# Patient Record
Sex: Male | Born: 1965 | Race: White | Hispanic: No | Marital: Married | Smoking: Former smoker
Health system: Western US, Academic
[De-identification: ages and names within clinical notes are randomized; demographics above are authoritative.]

## PROBLEM LIST (undated history)

## (undated) DIAGNOSIS — Z789 Other specified health status: Secondary | ICD-10-CM

## (undated) DIAGNOSIS — G43909 Migraine, unspecified, not intractable, without status migrainosus: Secondary | ICD-10-CM

## (undated) DIAGNOSIS — K219 Gastro-esophageal reflux disease without esophagitis: Secondary | ICD-10-CM

## (undated) DIAGNOSIS — F4310 Post-traumatic stress disorder, unspecified: Secondary | ICD-10-CM

## (undated) DIAGNOSIS — I471 Supraventricular tachycardia: Secondary | ICD-10-CM

## (undated) DIAGNOSIS — Z7289 Other problems related to lifestyle: Secondary | ICD-10-CM

## (undated) DIAGNOSIS — I10 Essential (primary) hypertension: Secondary | ICD-10-CM

## (undated) HISTORY — DX: Gastro-esophageal reflux disease without esophagitis: K21.9

## (undated) HISTORY — DX: Supraventricular tachycardia (CMS-HCC): I47.1

## (undated) HISTORY — PX: CARDIAC ELECTROPHYSIOLOGY STUDY AND ABLATION: SHX1294

## (undated) HISTORY — DX: Other problems related to lifestyle: Z72.89

## (undated) HISTORY — DX: Other specified health status: Z78.9

## (undated) HISTORY — DX: Essential (primary) hypertension: I10

## (undated) HISTORY — DX: Migraine, unspecified, not intractable, without status migrainosus: G43.909

## (undated) HISTORY — DX: Post-traumatic stress disorder, unspecified: F43.10

## (undated) MED ORDER — ZOLPIDEM TARTRATE 10 MG OR TABS
10.00 mg | ORAL_TABLET | Freq: Every evening | ORAL | Status: AC | PRN
Start: 2012-04-28 — End: ?

## (undated) MED ORDER — PRAVASTATIN SODIUM 40 MG OR TABS
40.00 mg | ORAL_TABLET | Freq: Every day | ORAL | 2 refills | Status: AC
Start: 2017-03-09 — End: ?

## (undated) MED ORDER — ZOLPIDEM TARTRATE 10 MG OR TABS
10.00 mg | ORAL_TABLET | Freq: Every evening | ORAL | Status: AC | PRN
Start: 2013-10-07 — End: ?

## (undated) MED ORDER — LISINOPRIL-HYDROCHLOROTHIAZIDE 10-12.5 MG OR TABS
1.00 | ORAL_TABLET | Freq: Every day | ORAL | 1 refills | Status: AC
Start: 2016-11-17 — End: ?

## (undated) MED ORDER — CLONAZEPAM 1 MG OR TABS
1.00 mg | ORAL_TABLET | Freq: Three times a day (TID) | ORAL | 0 refills | Status: AC
Start: 2019-04-08 — End: ?

## (undated) MED ORDER — FLUTICASONE PROPIONATE 50 MCG/ACT NA SUSP
2.00 | Freq: Every day | NASAL | 11 refills | Status: AC
Start: 2016-11-10 — End: ?

## (undated) MED ORDER — LIDOCAINE HCL 1 % IJ SOLN
0.10 mL | INTRAMUSCULAR | Status: AC | PRN
Start: 2015-03-07 — End: ?

## (undated) MED ORDER — LORAZEPAM 1 MG OR TABS
1.0000 mg | ORAL_TABLET | Freq: Four times a day (QID) | ORAL | 0 refills | Status: AC | PRN
Start: 2019-12-08 — End: ?

## (undated) MED ORDER — ZOLPIDEM TARTRATE 10 MG OR TABS
10.00 mg | ORAL_TABLET | Freq: Every evening | ORAL | Status: AC | PRN
Start: 2014-10-25 — End: ?

## (undated) MED ORDER — SUMATRIPTAN SUCCINATE 100 MG OR TABS
100.00 mg | ORAL_TABLET | Freq: Once | ORAL | Status: AC | PRN
Start: 2013-10-19 — End: ?

## (undated) MED ORDER — ALPRAZOLAM 2 MG OR TABS
2.00 mg | ORAL_TABLET | Freq: Every evening | ORAL | Status: AC | PRN
Start: 2016-08-11 — End: ?

## (undated) MED ORDER — LISINOPRIL-HYDROCHLOROTHIAZIDE 10-12.5 MG OR TABS
1.00 | ORAL_TABLET | Freq: Every day | ORAL | Status: AC
Start: 2014-10-25 — End: ?

## (undated) MED ORDER — LORAZEPAM 1 MG OR TABS
1.0000 mg | ORAL_TABLET | Freq: Four times a day (QID) | ORAL | 0 refills | Status: AC | PRN
Start: 2019-12-07 — End: ?

## (undated) MED ORDER — ZOLPIDEM TARTRATE 10 MG OR TABS
10.00 mg | ORAL_TABLET | Freq: Every evening | ORAL | Status: AC | PRN
Start: 2012-04-27 — End: ?

## (undated) MED ORDER — ALPRAZOLAM 2 MG OR TABS
2.00 mg | ORAL_TABLET | Freq: Every evening | ORAL | Status: AC | PRN
Start: 2015-01-30 — End: ?

## (undated) MED ORDER — LACTATED RINGERS IV SOLN
INTRAVENOUS | Status: AC
Start: 2015-03-07 — End: ?

## (undated) MED ORDER — ZOLPIDEM TARTRATE 10 MG OR TABS
10.00 mg | ORAL_TABLET | Freq: Every evening | ORAL | Status: AC | PRN
Start: 2014-07-12 — End: ?

## (undated) MED ORDER — BECLOMETHASONE DIPROPIONATE 80 MCG/ACT IN AERS
1.00 | INHALATION_SPRAY | Freq: Two times a day (BID) | RESPIRATORY_TRACT | 2 refills | Status: AC
Start: 2017-08-25 — End: ?

## (undated) MED ORDER — LISINOPRIL-HYDROCHLOROTHIAZIDE 10-12.5 MG OR TABS
1.00 | ORAL_TABLET | Freq: Every day | ORAL | 0 refills | Status: AC
Start: 2016-12-09 — End: ?

---

## 2005-01-31 ENCOUNTER — Other Ambulatory Visit (INDEPENDENT_AMBULATORY_CARE_PROVIDER_SITE_OTHER): Payer: Self-pay | Admitting: Occupational Health

## 2009-12-01 DIAGNOSIS — I471 Supraventricular tachycardia: Secondary | ICD-10-CM

## 2009-12-01 HISTORY — DX: Supraventricular tachycardia (CMS-HCC): I47.1

## 2009-12-01 HISTORY — PX: GALLBLADDER SURGERY: SHX652

## 2010-05-27 ENCOUNTER — Ambulatory Visit (INDEPENDENT_AMBULATORY_CARE_PROVIDER_SITE_OTHER): Admitting: Family Medicine

## 2010-06-10 ENCOUNTER — Encounter (INDEPENDENT_AMBULATORY_CARE_PROVIDER_SITE_OTHER): Payer: Self-pay | Admitting: Family Medicine

## 2010-06-10 ENCOUNTER — Ambulatory Visit (INDEPENDENT_AMBULATORY_CARE_PROVIDER_SITE_OTHER): Admitting: Family Medicine

## 2010-06-10 MED ORDER — SUMATRIPTAN SUCCINATE 100 MG OR TABS
100.00 mg | ORAL_TABLET | Freq: Once | ORAL | Status: DC | PRN
Start: ? — End: 2011-01-28

## 2010-06-10 MED ORDER — OMEPRAZOLE 20 MG OR CPDR
20.00 mg | DELAYED_RELEASE_CAPSULE | Freq: Every day | ORAL | Status: DC
Start: ? — End: 2015-03-07

## 2010-06-10 MED ORDER — XANAX XR PO
4.00 mg | ORAL | Status: DC
Start: ? — End: 2011-11-17

## 2010-06-10 NOTE — Progress Notes (Signed)
 SUBJECTIVE:  Brett Palmer is a 44 year old male who presents to clinic for:   Chief Complaint   Patient presents with   . Establish Care     - Has 6 year h/o biliary colic pain found to have gallstones on bedside US by friend who is tech.  Abdominal pain is RUQ, sharp, non radiating, intensity 10/10 at it's worst, non n/v/d, brought on by fatty meals, spontaneously resolves after fating for a while.  Last episode was 2-3 weeks ago.  Was seen in [place] for this issue but told could not be scheduled for surgery as it was not a acute issue.    - Has been gaining weight over the past few yrs. Has tried dieting without successful weight loss, and does not typically exercise.  Denies any FHx of HTN, HL or DM.    Review of Systems -   Constitutional: negative.  Eyes: negative.  Ears, Nose, Mouth, Throat: negative.  CV: negative.  Resp: negative.  GI: per hPI, rest neg.  GU: negative.  Musculoskeletal: negative.  Integumentary: negative.  Neuro: negative.  Psych: negative.  Endo: negative.  Heme/Lymphatic: negative.  Allergy/Immun: negative.    History:   There are no active problems to display for this patient.    Current outpatient prescriptions ordered prior to encounter   Medication Sig Dispense Refill   . omeprazole (PRILOSEC) 20 MG capsule Take 20 mg by mouth daily.       . sumatriptan (IMITREX) 100 MG tablet Take 100 mg by mouth once as needed. May repeat in 2 hours in needed           Allergies   Allergen Reactions   . Compazine Anxiety   . Latex Rash       Family History   Problem Relation Age of Onset   . Cancer Father      melanoma, died at age 8       History   Social History   . Marital Status: Married     Spouse Name: [name]     Number of Children: 2   . Years of Education: N/A   Occupational History   . 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 Smoking status: Former Smoker -- 0.5 packs/day for 10 years     Types: Cigarettes     Quit date: 01/11/1990   . Smokeless tobacco: Not on  file   . Alcohol Use: 3.5 - 7.0 oz/week     7-14 drink(s) per week      wine   . Drug Use: No   . Sexually Active: Yes -- Male partner(s)   Other Topics Concern   . Not on file   Social History Narrative   . No narrative on file       OBJECTIVE:    BP 147/80  Pulse 82  Temp(Src) 98 F (36.7 C) (Oral)  Wt 229 lb (103.874 kg)    There is no height on file to calculate BMI.    General Appearance: healthy, alert, no distress, pleasant affect, cooperative, skin warm, dry, and pink.  Mouth: mmm.  Neck:  Neck supple. No adenopathy, thyroid symmetric, normal size.  Heart:  normal rate and regular rhythm, no murmurs, clicks, or gallops.  Lungs: clear to auscultation bilateral  Abdomen: BS normal.  Abdomen soft, non-tender, neg Murphy's sign.  No masses or organomegaly.      ASSESMENT AND PLAN:  [name] was seen today for establish care.  Diagnoses and associated orders for this visit:    Biliary colic  Currently asymptomatic with benign abdominal exam but per history abdominal pain seems c/w with biliary colic.  Will get [place] to eval for gallstones or biliary duct dilation.  Surgery consult depending on results.  For now will continue on dietary precautions to avoid new onset pain.  - MYCHART ACCESS CODE PROCEDURE  - US Abdomen  - Comprehensive Metabolic Panel Yellow serum separator tube    Obesity and Elevated BP  Discussed importance of diet, exercise and weight loss to decrease CV risk factors.  Check basic labs and referral placed for nutrition clinic.  - Lipid Panel Yellow serum separator tube; Future  - Comprehensive Metabolic Panel Yellow serum separator tube  - Glycosylated Hgb(A1C), Blood Lavender  - TSH, Blood Yellow serum separator tube  - Nutrition Clinic      Pt was seen by and discussed with attending Dr. Ranelle Oyster MD  [facility]  [id number]

## 2010-06-11 ENCOUNTER — Other Ambulatory Visit (INDEPENDENT_AMBULATORY_CARE_PROVIDER_SITE_OTHER): Payer: Self-pay | Admitting: Family Medicine

## 2010-06-11 DIAGNOSIS — E669 Obesity, unspecified: Secondary | ICD-10-CM

## 2010-06-15 NOTE — Progress Notes (Signed)
 Attending Note:    Subjective:  I reviewed the history.  Patient interviewed and examined.  History of present illness (HPI):  Establish Care     Review of Systems (ROS): As per  the resident's note.  Past Medical, Family, Social History:  As per  the resident's  note.    Objective:   I have examined the patient and I concur with the resident's exam.    Assessment and plan reviewed with the resident physician.  I agree with the resident's plan as documented.    See the resident's note for further details.

## 2010-06-25 LAB — LIPID(CHOL FRACT) PANEL, BLOOD
Cholesterol: 264 mg/dL — ABNORMAL HIGH (ref <200)
HDL-Cholesterol: 78 mg/dL
LDL-Chol (Calc): 169 mg/dL — ABNORMAL HIGH (ref <160)
Triglycerides: 84 mg/dL (ref 10–170)

## 2010-06-25 LAB — COMPREHENSIVE METABOLIC PANEL, BLOOD
ALT (SGPT): 206 U/L — ABNORMAL HIGH (ref 0–41)
AST (SGOT): 134 U/L — ABNORMAL HIGH (ref 0–40)
Albumin: 4.7 g/dL (ref 3.5–5.2)
Alkaline Phos: 71 U/L (ref 40–129)
BUN: 15 mg/dL (ref 6–20)
Bicarbonate: 28 mmol/L (ref 22–29)
Bilirubin, Tot: 0.4 mg/dL (ref <1.2)
Calcium: 9.8 mg/dL (ref 8.6–10.5)
Chloride: 101 mmol/L (ref 98–107)
Creatinine: 0.89 mg/dL (ref 0.67–1.17)
Glucose: 101 mg/dL (ref 70–115)
Potassium: 4.3 mmol/L (ref 3.5–5.1)
Sodium: 138 mmol/L (ref 136–145)
Total Protein: 7.3 g/dL (ref 6.0–8.0)

## 2010-06-25 LAB — TSH, BLOOD: TSH: 3.6 u[IU]/mL (ref 0.27–4.20)

## 2010-06-25 LAB — GLYCOSYLATED HGB(A1C), BLOOD: Glyco Hgb (A1C): 5.6 % (ref 4.8–5.9)

## 2010-06-25 LAB — GFR: GFR: >60 mL/min

## 2010-07-02 ENCOUNTER — Telehealth (INDEPENDENT_AMBULATORY_CARE_PROVIDER_SITE_OTHER): Payer: Self-pay | Admitting: Family Medicine

## 2010-07-02 NOTE — Telephone Encounter (Signed)
 Called patient and left message indicating need to f/u in clinic to discuss abnormal labs and abdominal US.

## 2010-07-09 NOTE — Telephone Encounter (Signed)
 Called and spoke with pt. Scheduled follow up appt 07-16-10 with Dr Alphonse Guild

## 2010-07-16 ENCOUNTER — Ambulatory Visit (INDEPENDENT_AMBULATORY_CARE_PROVIDER_SITE_OTHER): Admitting: Family Medicine

## 2010-07-16 MED ORDER — ZOLPIDEM TARTRATE 10 MG OR TABS
5.0000 mg | ORAL_TABLET | Freq: Every evening | ORAL | Status: DC | PRN
Start: 2010-07-16 — End: 2010-09-30

## 2010-07-16 NOTE — Patient Instructions (Signed)
 Health Maintenance: Controlling Cholesterol    What is cholesterol?   Cholesterol is a type of fat. It has both good and bad effects on the body. Your body uses cholesterol to make hormones and to build and maintain nerve cells. However, when your body has too much cholesterol, deposits of fat called plaque form inside blood vessel walls. The blood vessel walls thicken and the vessels become narrower (a condition called atherosclerosis). This change in the blood vessels reduces blood flow through the blood vessels, possibly leading to heart attacks or strokes.     Most of the cholesterol in your blood is made by your liver from the fats, carbohydrates, and proteins you eat. You also get cholesterol by eating animal products such as meat, eggs, and dairy products.     How is cholesterol measured?   When you get your cholesterol checked, your health care provider will give you a number for your total cholesterol level. You can use the chart below to see if your total cholesterol is high.       ----------------------------------------    less than 200        good     200 to 239           borderline     high 240 or above    high     ----------------------------------------           When your cholesterol is measured and found to be high, your health care provider may also check the amount of LDL (low-density lipoproteins) and HDL (high-density lipoproteins) in your blood. LDL and HDL carry cholesterol through your blood. LDLs carry a lot of cholesterol, leave behind fatty deposits on your artery walls, and contribute to heart disease. HDLs do the opposite. They clean the artery walls and remove extra cholesterol from the body, thus lowering the risk of heart disease. LDL is called "bad" cholesterol. (You can think of "L" for "lousy" cholesterol.) HDL is called "good" cholesterol (think of "H" for "healthy" cholesterol). It is good to have low levels of LDL and high levels of HDL.     The recommended levels of LDL are  shown in the following chart:      ---------------------------------------------------------------    Less than 160   For most people     Less than 130   If you have an increased risk of heart                  disease     Less than 100   If you have heart disease, diabetes,                  peripheral artery disease, abdominal aortic                  aneurysm, or symptomatic carotid artery disease     ---------------------------------------------------------------             For HDL, a level below 40 mg/dL is too low. The recommended HDL level is 45 mg/dL or higher.     How can I control my cholesterol level?   Cholesterol levels can usually be controlled by eating right, exercising, and not smoking.     Follow these diet guidelines to help control your cholesterol:       Reduce the amount of cholesterol in your diet. The American Heart Association recommends eating less than 300 milligrams (mg) of cholesterol a day if you do not have heart disease.  Eat less than 200 mg if you have heart disease.   Eat less fat. Fats should contribute no more than 30% of your daily calories. No more than 10% of the fat you eat should be saturated fat. If you have heart disease, no more than 7% of the fat should be saturated. Some kinds of fats are better than others. Polyunsaturated and monounsaturated fats are better than saturated fats. Monounsaturated fats are found in olive oil, canola oil, and avocados. Polyunsaturated fats are found in fish and some vegetable oils. Saturated fat raises your blood cholesterol because it makes it hard for the body to clear the cholesterol away. Saturated fat is found in different amounts in almost all foods. Butter, some oils, meat, and poultry fat contain a lot of saturated fat.   Adjust the amount of calories you eat and exercise regularly to maintain a lean body weight.     To control the amount of fat and cholesterol you eat:       Check food labels for fat and cholesterol content.   Limit  the amount of butter and margarine you eat.   Use sunflower, safflower, soybean, canola, corn, or olive oil rather than tropical oils such as palm or coconut.   Use salad dressings and margarine made with polyunsaturated and monounsaturated fats.   Use egg whites or egg substitutes rather than whole eggs.   Replace whole-milk dairy products with nonfat or low-fat milk, cheese, spreads, and yogurt.   Eat skinless chicken, [place], fish, and meatless entrees more often than red meat.   Choose lean cuts of meat and trim off all visible fat. Keep portion sizes moderate.   Avoid fatty desserts such as ice cream, cream-filled cakes, and cheesecakes. Choose fresh fruits, nonfat frozen yogurt, Popsicles, etc.   Reduce the amount of fried foods, vending machine food, and fast food you eat.   Limit the amount of nuts you eat, especially nuts high in saturated fat. Examples of nuts that are especially high in saturated fat are cashews, pistachios, and [place] and macadamia nuts.   Eat fruits and vegetables (especially fresh fruits and leafy vegetables), beans, and whole grains daily. The fiber in these foods helps lower cholesterol.   Look for low-fat or nonfat varieties of the foods you like to eat, or look for substitutes.     Exercise goes hand-in-hand with a healthy diet for controlling cholesterol. Exercise helps because it:       Keeps your weight down.   Decreases your total cholesterol level.   Decreases your LDL (bad cholesterol).   Increases your HDL (good cholesterol).     A good exercise program includes aerobic exercise. Aerobic exercise is any activity that keeps your heart rate up (such as swimming, jogging, walking, and bicycling). You should get 20 to 30 minutes of aerobic exercise at least every other day.     If you haven't been exercising, ask your health care provider for an exercise prescription and start your new exercise program slowly.     Do not smoke. Smoking increases your risk of heart disease  because it lowers HDL levels.     High cholesterol may run in families. Know your family history and discuss it with your health care provider.     In summary, to control your cholesterol level:       Eat healthy.   Get regular exercise.   Don't smoke.   Check your cholesterol yearly.         --------------------------------------------------------------------------------  Published by [facility].    This content is reviewed periodically and is subject to change as new health information becomes available. The information is intended to inform and educate and is not a replacement for medical evaluation, advice, diagnosis or treatment by a healthcare professional.       Developed by [employer].   Copyright  2004 [facility] [employer]. All rights reserved.   Copyright  Clinical Reference Systems 2004  Adult [facility] Advisor - Copyright  [employer]. All rights reserved.   www.mdconsult.com

## 2010-07-16 NOTE — Progress Notes (Signed)
 SUBJECTIVE:  Brett Palmer is a 44 year old male who presents to clinic for:   Chief Complaint   Patient presents with   . Recheck  Has intermittent almost daily RUQ pain, after meals, moderate intensity, non radiating, lasting about 6 hrs.  Has made some dietary changes that seem to have helped a little but still having significant symptoms.  Sometimes has nausea but no vomiting.   Recent labs showed:  Component Latest Ref Rng 06/25/2010 06/25/2010 06/25/2010 06/25/2010 06/25/2010      9:00 AM  9:00 AM  9:00 AM  9:00 AM  9:00 AM   Glucose 70 - 115 mg/dL 175       Bun 6 - 20 mg/dL 15       Creatinine 1.02 - 1.17 mg/dL 5.85       Sodium 277 - 145 mmol/L 138       Potassium 3.5 - 5.1 mmol/L 4.3       Chloride 98 - 107 mmol/L 101       Bicarbonate 22 - 29 mmol/L 28       Calcium 8.6 - 10.5 mg/dL 9.8       Total Protein 6.0 - 8.0 g/dL 7.3       ALBUMIN 3.5 - 5.2 g/dL 4.7       Bilirubin, Tot Low: <1.2 mg/dL 0.4       AST (SGOT) 0 - 40 U/L 134 (H)       ALT (SGPT) 0 - 41 U/L 206 (H)       Alkaline Phos 40 - 129 U/L 71       Cholesterol Low: <200 mg/dL    824 (H)    HDL-Cholesterol mg/dL    78    LDL-Chol (Calc) Low: <160 mg/dL    235 (H)    Triglycerides 10 - 170 mg/dL    84    Glyco Hgb (T6R) 4.8 - 5.9 %  5.6      TSH 0.27 - 4.20 uIU/mL   3.60     GFR mL/min     >60        . Ultrasound  Report shows fatty liver and cholelithiasis.     Review of Systems -   Constitutional: weight gain.  CV: negative.  Resp: negative.  GI: abdominal pain, excessive gas or bloating, negative for: vomiting, early satiety, melena.  GU: negative.  Endo: negative.    History:   There are no active problems to display for this patient.    Current outpatient prescriptions ordered prior to encounter   Medication Sig Dispense Refill   . ALPRAZolam (XANAX XR PO) 4 mg.       . omeprazole (PRILOSEC) 20 MG capsule Take 20 mg by mouth daily.       . sumatriptan (IMITREX) 100 MG tablet Take 100 mg by mouth once as needed. May repeat in 2 hours in needed            Allergies   Allergen Reactions   . Compazine Anxiety   . Latex Rash       Family History   Problem Relation Age of Onset   . Cancer Father      melanoma, died at age 54       History   Social History   . Marital Status: Married     Spouse Name: [name]     Number of Children: 2   . Years of Education: N/A   Occupational History   .  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 Smoking status: Former Smoker -- 0.5 packs/day for 10 years     Types: Cigarettes     Quit date: 01/11/1990   . Smokeless tobacco: Not on file   . Alcohol Use: 3.5 - 7.0 oz/week     7-14 drink(s) per week      wine   . Drug Use: No   . Sexually Active: Yes -- Male partner(s)   Other Topics Concern   . Not on file   Social History Narrative   . No narrative on file         OBJECTIVE:    BP 149/91  Pulse 117  Temp(Src) 97.7 F (36.5 C) (Oral)  Wt 102.513 kg (226 lb)    There is no height on file to calculate BMI.    General Appearance: healthy, alert, no distress, pleasant affect, cooperative, skin warm, dry, and pink.  Mouth: OP clear and mmm.  Heart:  normal rate and regular rhythm, no murmurs, clicks, or gallops.  Lungs: clear to auscultation b/l  Abdomen: Abdomen soft, mildly tender in RUQ with questionable Murphy sign, no rebound or guarding. No masses or organomegaly. Bowel sounds normal.       ASSESMENT AND PLAN:  Shaman was seen today for recheck and ultrasound.  Diagnoses and associated orders for this visit:    Cholelithiasis without obstruction  Has recurrent, almost daily RUQ pain related to meals with transaminitis and US showing cholelithiasis.  It's affecting his daily life and has made some lifestyle and dietary changes with minimal improvement, discussed them with patient and suggested multiple stricter dietary precautions including alcohol abstinence.  Will f/u with surgery for evaluation and further recs.  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 Drinks occassionally but was drinking almost daily during the time labs were drawn, denies daily tylenol use and not on any other meds.  Likely due to fatty liver from cholesterol, weight, alcohol and diet regimen.  Patient will make significant lifestyle changes and if still having significant transaminitis despite changes in weight, cholesterol and diet, then will have to further w/u.  - Liver Panel, Blood Yellow serum separator tube; Future    Hyperlipidemia ldl goal < 160  Diet and lifestyle changes for now for weight loss and recheck in 3 months.  - Lipid Panel Yellow serum separator tube; Future    Elevated blood pressure reading without diagnosis of hypertension  Will recheck at next visit and if consistently elevated consider diagnosis of HTN and starting HCTZ.    Insomnia  Alternates day and night shifts at works, has tried multiple calming techniques and melatonin without help.  Trial ambien, start with 5 mg and increase to 10 mg prn.   - zolpidem (AMBIEN) 10 MG tablet; Take 0.5 tablets by mouth nightly as needed for Insomnia.        Pt was discussed with attending Dr. Ranelle Oyster MD  [facility]  PGY3

## 2010-07-22 ENCOUNTER — Telehealth (HOSPITAL_BASED_OUTPATIENT_CLINIC_OR_DEPARTMENT_OTHER): Payer: Self-pay | Admitting: Surgery

## 2010-07-22 NOTE — Telephone Encounter (Signed)
 Pt would like a call back regarding a sooner appt, would like appt sooner then 08/14/2010. States he was told by various sx physicians that his condition is urgent. Pls contact pt as soon as possible.

## 2010-07-23 NOTE — Telephone Encounter (Signed)
 SPOKE TO PATIENT, HIS NO SYMPTOMS AT THIS TIME, WILL WAIT FOR 08/14/10 APPT.

## 2010-08-14 ENCOUNTER — Encounter (INDEPENDENT_AMBULATORY_CARE_PROVIDER_SITE_OTHER): Payer: Self-pay | Admitting: Surgery

## 2010-08-14 ENCOUNTER — Ambulatory Visit (HOSPITAL_BASED_OUTPATIENT_CLINIC_OR_DEPARTMENT_OTHER): Admitting: Surgery

## 2010-08-14 VITALS — BP 131/84 | HR 95 | Temp 97.4°F | Resp 14 | Ht 72.0 in | Wt 221.1 lb

## 2010-08-14 LAB — DRAW TYPE AND SCREEN SPECIMEN

## 2010-08-14 NOTE — Patient Instructions (Signed)
Brett Palmer should be excused from work from August 26, 2010 until September 09, 2010 for medical reasons.  Work restrictions include not lifting anything over 10 pounds.  Please place patient on light duty until September 23, 2010.

## 2010-08-14 NOTE — Progress Notes (Signed)
HPI:  44 year old male referred from his primary care for symptomatic gallstones.  He presents today with a long history of post-prandial RUQ pain.  He reports a 10/10 sharp pain in his RUQ after eating greasy or fatty foods that lasts several hours.  The pain does not radiate to any other location.  He reports approximately 6 episodes per year.  He denies any vomiting but does state that he has occasional nausea.  He had a RUQ [place] in July which revealed a fatty thickened gallbladder with a large stone.  Labs at that time showed a transaminitis but no elevations in his bilirubins.  He does endorse a family history of gall bladder disease, stating that most of his family has had their gallbladders removed.  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    PMH:  Meniere's, PTSD    PSH:  None    FH:  As stated in HPI    SH:  Does drink 2 glasses of wine per night    Medications: Ambien and Xanax PRN    Allergies:  Compazine and latex    Physical Exam  Blood pressure 131/84, pulse 95, temperature 97.4 F (36.3 C), temperature source Oral, resp. rate 14, height 6' (1.829 m), weight 100.281 kg (221 lb 1.3 oz).  Gen:  NAD, fair haired and fair skinned gentelman  Abd: soft, NTND, no pain in RUQ, no organomegaly    Lab Results   Component Value Date    NA 138 06/25/2010    K 4.3 06/25/2010    CL 101 06/25/2010    BICARB 28 06/25/2010    BUN 15 06/25/2010    CREAT 0.89 06/25/2010    GLU 101 06/25/2010    [place] 9.8 06/25/2010       Lab Results   Component Value Date    AST 134 06/25/2010    ALT 206 06/25/2010    ALK 71 06/25/2010    TP 7.3 06/25/2010    ALB 4.7 06/25/2010    TBILI 0.4 06/25/2010       [place]:  1. Mild enlarged and fatty liver.    2. Cholelithiasis without evidence of acute cholecystitis.    A/P:  44 year old male with long history of post-prandial non-radiating RUQ and gallstones on [place], likely symptomatic cholelithiasis.  1.  Discussed risks and benefits of laparoscopic cholecystectomy.  Pt agrees to have  procedure.  2.  OR scheduled for 9/24.  3.  CBC, Chem 7, LFTs, type and screen, EKG ordered pre-op  4.  Pt scheduled for pre-op clinic  5.  Work excuse note written and given to pt

## 2010-08-14 NOTE — Progress Notes (Signed)
31M with gallstones and biliary colic - description of pain very classic - 10/10, lasts hours, onset with fatty food, gets 1x attack / month. RUQ pain only, no radiation.  Tale Prilosec for GERD, doesn't help this pain.     Strong family history of gallbladder issues, all imediate family had gallbladders out.    No prior ab surgery. PMH Menieres, PTSD, GERD.    On exam. No sig RUQ findings,     Explained lap cholecystectomy, he agrees, booked for 2 wks.

## 2010-08-20 ENCOUNTER — Ambulatory Visit (HOSPITAL_BASED_OUTPATIENT_CLINIC_OR_DEPARTMENT_OTHER): Admitting: Family

## 2010-08-20 VITALS — BP 124/79 | HR 94 | Temp 97.6°F | Resp 18 | Ht 72.0 in | Wt 217.8 lb

## 2010-08-20 LAB — CBC WITH DIFF, BLOOD
Abs Eosinophils: 0.2 10*3/uL (ref 0.0–0.5)
Abs Lymphs: 2.5 10*3/uL (ref 0.8–3.1)
Abs Monos: 0.4 10*3/uL (ref 0.2–0.8)
Absolute Neutrophil Count: 2.7 10*3/uL (ref 1.6–7.0)
Eosinophils: 3 % (ref 1–7)
Hct: 46.3 % (ref 40.0–50.0)
Hgb: 16 g/dL (ref 13.7–17.5)
Lymphocytes: 43 % (ref 19–53)
MCH: 30.2 pg (ref 26.0–32.0)
MCHC: 34.6 % (ref 32.0–36.0)
MCV: 87.4 um3 (ref 79.0–95.0)
MPV: 9.7 fL (ref 9.4–12.4)
Monocytes: 7 % (ref 5–12)
Plt Count: 170 10*3/uL (ref 140–370)
RBC: 5.3 10*6/uL (ref 4.60–6.10)
RDW: 12.8 % (ref 12.0–14.0)
Segs: 47 % (ref 34–71)
WBC: 5.8 10*3/uL (ref 4.0–10.0)

## 2010-08-20 LAB — TYPE & SCREEN
ABO/RH: O NEG
Antibody Screen: NEGATIVE

## 2010-08-20 LAB — LIVER PANEL, BLOOD
ALT (SGPT): 153 U/L — ABNORMAL HIGH (ref 0–41)
AST (SGOT): 87 U/L — ABNORMAL HIGH (ref 0–40)
Albumin: 5 g/dL (ref 3.5–5.2)
Alkaline Phos: 72 U/L (ref 40–129)
Bilirubin, Dir: 0.2 mg/dL — ABNORMAL HIGH (ref <0.2)
Bilirubin, Tot: 0.5 mg/dL (ref <1.2)
Total Protein: 7.7 g/dL (ref 6.0–8.0)

## 2010-08-20 LAB — GFR: GFR: >60 mL/min

## 2010-08-20 LAB — BASIC METABOLIC PANEL, BLOOD
BUN: 11 mg/dL (ref 6–20)
Bicarbonate: 29 mmol/L (ref 22–29)
Calcium: 9.8 mg/dL (ref 8.6–10.5)
Chloride: 98 mmol/L (ref 98–107)
Creatinine: 0.87 mg/dL (ref 0.67–1.17)
Glucose: 92 mg/dL (ref 70–115)
Potassium: 4.3 mmol/L (ref 3.5–5.1)
Sodium: 137 mmol/L (ref 136–145)

## 2010-08-20 MED ORDER — ALPRAZOLAM 2 MG OR TABS
2.00 mg | ORAL_TABLET | Freq: Every day | ORAL | Status: DC
Start: ? — End: 2013-02-22

## 2010-08-20 NOTE — Progress Notes (Signed)
 This visit was done for pre-operative care and a history and physical, if indicated, was entered in anticipation of an upcoming procedure.

## 2010-08-20 NOTE — Patient Instructions (Addendum)
Nothing to eat or drink after midnight on day of surgery.  Take prilosec with sip of water on the morning of surgery.  Stop herbal medications and supplements one week prior to surgery.  Stop aspirin and all antiinflammatories (such as advil, nuprin, motrin, aleve) one week prior to surgery.     Pt need to go to lab after preop visit 08/20/10

## 2010-08-20 NOTE — H&P (Signed)
Preoperative History and Physical    Primary Care Physician [name], MD    Attending Physician:  Dwana Curd M.D.    Chief complaint:  Biliary colic    Pain Score: zero  Location:   Quality:   Patient denies physical or sexual abuse    History of Present  Illness:  This is a 44 year old male who presents to the [facility] for preoperative history and physical.  Patient is scheduled for laparoscopic cholecystectomy without grams on 08-26-10 with Dr. Dwana Curd .    Patient Active Problem List   Diagnoses Date Noted   . Biliary colic [574.20K] 08/14/2010         Past Medical History   Diagnosis Date   . Gastroesophageal reflux disease    . Migraine headache      part of gulf war syndrome   . PTSD (post-traumatic stress disorder) 1995-present     treated by Dr. Ahmed Prima at [facility]          Past Surgical History   Procedure Date   . Wisdom teeth extraction      ( no family problems with anesthesia)    Current outpatient prescriptions   Medication Sig Dispense Refill   . alprazolam (XANAX) 2 MG tablet Take 2 mg by mouth daily. 2 tablet once a day       . zolpidem (AMBIEN) 10 MG tablet Take 0.5 tablets by mouth nightly as needed for Insomnia.  30 tablet  0   . omeprazole (PRILOSEC) 20 MG capsule Take 20 mg by mouth daily.       . sumatriptan (IMITREX) 100 MG tablet Take 100 mg by mouth once as needed. May repeat in 2 hours in needed             Allergies   Allergen Reactions   . Compazine Anxiety   . Latex Rash         FAMILY HISTORY:  Diabetes: none  TB: none  Cancer: father melanoma  HTN: none  Cardiac: none  Mental health: none    History   Social History   . Marital Status: Married     Spouse Name: [name]     Number of Children: 2   . Years of Education: N/A   Occupational History   . 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 Smoking status: Former Smoker -- 0.5 packs/day for 10 years     Types: Cigarettes     Quit date: 01/11/1990   . Smokeless tobacco: Not on file   . Alcohol Use: 3.5 - 7.0  oz/week     7-14 drink(s) per week      wine   . Drug Use: No   . Sexually Active: Yes -- Male partner(s)       Review of Systems:  GENERAL: No recent cold or flu, no fever, no wt. loss  EYES: no  glasses, no acuity change.  ENT: left ear 90% hearing loss, no aide nasal drainage, sore throat.  CV:  Denies chest pain, palpitations, PND, orthopnea.  Able to walk 6 miles, able to climb 6 ftls.  RESP: denies SOB, cough, wheezing, sputum, calf pain.   Denies  sleep apnea.  - STOP  GI: + intermittent abd. Pain, denies nausea, vomiting, diarrhea,  + constipation,  No melena, no  hematochezia,  + GERD/heartburn.  GU: denies dysuria, urgency, hematuria,  frequency, nocturia.  MUSCULOSKELETAL:  Denies  joint pain, back pain, neck pain.  SKIN:  Denies lesions, rashes, masses.  NEURO: denies  syncope, dizziness; +  Headaches.  No seizures, weakness, numbness.  PSYCH: denies  depression, anxiety, suicidal. + PTSD  ENDOCRINE: denies  polyuria, polydipsia, thyroid problems.  HEME/LYMPH:  Denies  bruising, bleeding, anemia.   MRSA-VRE:  Denies  HEPATITIS:  Denies  CANCER:  denies    Physical Exam:   Vitals: BP 124/79  Pulse 94  Temp(Src) 97.6 F (36.4 C) (Oral)  Resp 18  Ht 6' (1.829 m)  Wt 98.8 kg (217 lb 13 oz)  BMI 29.54 kg/m2  SpO2 98%    Physical Exam  General: WN WD male NAD, well groomed,  cooperative,   A+O x 3,  speech clear.  HEENT:  NCAT,  EOMI, PERRL, sclera clear,  TM intact, throat clear,  MP 1.  Dentition intact.  prognath +.  HMD3. Full ROM  NECK/THYROID:  NTTP,  supple & FROM,  no masses, no thyromegaly.  No lymphadenopathy .  CV: S1, S2 with no murmurs, rubs, or gallops; no JVD.  Pulmonary: CTAB, no WRR, normal A/P diameter.  Abdomen: NTND, NABS, no HSM, no hernias.  Musculoskeletal: NTTP, FROM x 4 extremities, strength 5/5 x 4 extremities.  Back:  NTTP along spinal column, no CVAT, ROM: full.  Neuro:  CN 2-12 grossly intact.  O x3.  Voice clear.  Strength equilateral.   Skin: normal color, turgor; no  rashes, lesions, or open sores.  Extremities: warm, dry without cyanosis, clubbing, or edema, peripheral pulses palpable  .       Labs: (new labs ordered by surgeon and pending)  Lab Results   Component Value Date    NA 138 06/25/2010    K 4.3 06/25/2010    CL 101 06/25/2010    BICARB 28 06/25/2010    BUN 15 06/25/2010    CREAT 0.89 06/25/2010    GLU 101 06/25/2010    [place] 9.8 06/25/2010    AST 134 06/25/2010    ALT 206 06/25/2010    ALK 71 06/25/2010    TBILI 0.4 06/25/2010    ALB 4.7 06/25/2010    TP 7.3 06/25/2010       EKG: 08-20-10 NSR, possible left atrial enlargement, T wave abnormality     Imaging: 06-25-10 US IMPRESSION:  1. Mild enlarged and fatty liver.  2. Cholelithiasis without evidence of acute cholecystitis.      Assessment/Plan  A.   44 year old male  with a history of abdomenal pain and gallstones.    P.  Scheduled for laparoscopic cholecystectomy without grams on 08-26-10 with Dr. Dwana Curd.    Instructions  1. Do not drink or eat anything after midnight, unless otherwise specified.   2. Take your medications as directed by anesthesia.   3. Do not take herbal supplements for 7 days prior to surgery.   4. Stop ASA, NSAID's 7-10 days prior to procedure.    Code Status: Full Code/Full Care,  Advanced Directive: no

## 2010-08-21 NOTE — H&P (Signed)
 I saw the patient with the resident and agree with the resident's findings and plan we made.

## 2010-08-22 LAB — ECG, COMPLETE (HC/~~LOC~~/ENCINITAS)
ATRIAL RATE: 73 {beats}/min
ECG INTERPRETATION: NORMAL
P AXIS: 31 degrees
PR INTERVAL: 156 ms
QRS INTERVAL/DURATION: 100 ms
QT: 386 ms
QTc (Bazett): 425 ms
R AXIS: 72 degrees
T AXIS: 24 degrees
VENTRICULAR RATE: 73 {beats}/min

## 2010-08-26 ENCOUNTER — Ambulatory Visit
Admit: 2010-08-26 | Discharge: 2010-08-29 | Payer: Self-pay | Source: Ambulatory Visit | Attending: Surgery | Admitting: Surgery

## 2010-08-26 DIAGNOSIS — K8020 Calculus of gallbladder without cholecystitis without obstruction: Secondary | ICD-10-CM

## 2010-08-26 MED ORDER — SODIUM CHLORIDE 0.9 % IV SOLN
1000.0000 mg | Freq: Once | INTRAVENOUS | Status: DC
Start: 2010-08-26 — End: 2010-08-30

## 2010-08-26 MED ORDER — METRONIDAZOLE IN NACL 5-0.79 MG/ML-% IV SOLN
500.0000 mg | Freq: Once | INTRAVENOUS | Status: DC
Start: 2010-08-26 — End: 2010-08-30

## 2010-08-26 MED ORDER — ONDANSETRON HCL 4 MG/2ML IV SOLN
4.00 mg | Freq: Once | INTRAMUSCULAR | Status: AC | PRN
Start: 2010-08-26 — End: 2010-08-26

## 2010-08-26 MED ORDER — FENTANYL CITRATE 0.05 MG/ML IJ SOLN
INTRAMUSCULAR | Status: AC
Start: 2010-08-26 — End: 2010-08-26
  Administered 2010-08-26: 25 ug
  Filled 2010-08-26: qty 2

## 2010-08-26 MED ORDER — FENTANYL CITRATE 0.05 MG/ML IJ SOLN
25.00 ug | INTRAMUSCULAR | Status: AC | PRN
Start: 2010-08-26 — End: 2010-08-26
  Administered 2010-08-26 (×3): 25 ug via INTRAVENOUS
  Filled 2010-08-26: qty 2

## 2010-08-26 MED ORDER — NALOXONE HCL 0.4 MG/ML IJ SOLN
0.10 mg | INTRAMUSCULAR | Status: AC | PRN
Start: 2010-08-26 — End: 2010-08-26

## 2010-08-26 MED ORDER — HYDROMORPHONE HCL 1 MG/ML IJ SOLN
0.50 mg | INTRAMUSCULAR | Status: AC | PRN
Start: 2010-08-26 — End: 2010-08-26

## 2010-08-26 MED ORDER — FENTANYL CITRATE 0.05 MG/ML IJ SOLN
50.00 ug | INTRAMUSCULAR | Status: AC | PRN
Start: 2010-08-26 — End: 2010-08-26
  Administered 2010-08-26: 50 ug via INTRAVENOUS

## 2010-08-26 NOTE — Interdisciplinary (Signed)
MD at bedside speaking with pt, scripts faxed to [facility].

## 2010-08-26 NOTE — Brief Op Note (Signed)
BRIEF OPERATIVE NOTE    DATE: 08/26/2010  TIME: 9:09 AM    PREOPERATIVE DIAGNOSIS: symptomatic cholelithiasis    POSTOPERATIVE DIAGNOSIS: same    PROCEDURE: laparoscopic cholecystectomy    ATTENDING SURGEON: Doucet  ASSISTANTS(s): Michaelene Dutan    ANESTHESIA: general    FINDINGS: gallbladder with stones    SPECIMENS: gallbladder to pathology, permanent    Fluids/Blood Products:      IV Fluids: crystalloid    Blood Products: none    EBL: 20mL    Urine Output: not recorded    COMPLICATIONS: none    DISPOSITION: stable extubated to recovery

## 2010-08-26 NOTE — Interdisciplinary (Signed)
Pt and wife given written and verbal discharge instructions both verbalized understanding.

## 2010-08-26 NOTE — Discharge Instructions (Signed)
May shower after 24 hours with dressings intact.  May remove dressings after 48 hours.  Leave steri strips on until they fall off on their own.  Keep incisions clean and dry at all times (other than shower).  No soaking wounds as in tub bath, jacuzzi or pool for two weeks.  No heavy lifting, over ten pounds, for one month.  Recommend a low fat diet for first month, then return to regular diet as tolerated.  You may experience some diarrhea for the first month following surgery, this is normal.  Take pain medications as needed, and use a stool softener while on narcotic pain medications.  Do not drive while on narcotics.  Return to clinic in two weeks for follow-up appointment.  Return sooner for fever > 101, redness or drainage from incisions, or abdominal pain with persistent nausea and vomiting.

## 2010-08-26 NOTE — Op Note (Signed)
Dictating Practitioner: Lysbeth Penner. Dwana Curd, M.D.     Staff Physician: Lysbeth Penner. Dwana Curd, M.D.    Date of Operation: 08/26/2010        PREOPERATIVE DIAGNOSIS: Biliary colic and cholelithiasis.    POSTOPERATIVE DIAGNOSIS: Biliary colic and cholelithiasis.    PROCEDURE PERFORMED: Laparoscopic cholecystectomy.    SURGEON/STAFF: Lysbeth Penner. Dwana Curd, MD    RESIDENT: Dyanne Iha, M.D.    ANESTHESIA: General.    INDICATIONS: This patient presented with a history of recurrent right  upper quadrant lasting at least 6 months. Ultrasound demonstrated  cholelithiasis. His pain was typical of biliary colic, being quite severe  on the right side of his abdomen. He has a strong family history for  cholelithiasis, biliary colic, and most of his relatives have had  cholecystectomy. The patient had procedure laparoscopic explained to him,  including the risks and benefits, including infection, bleeding,  reoperation bile leak, duct injury. The patient indicated that he  understood the risks and benefits of the procedure, and his questions have  been answered, provided written consent prior to the procedure.    DESCRIPTION OF PROCEDURE: The patient was brought to the operating room,  placed on the OR table in supine position. A general endotracheal  anesthetic was obtained. He was prepped and draped around the abdomen in  the usual fashion. After a timeout for safety, the inferior rim of the  umbilicus was infiltrated with 1% lidocaine.    A vertical 5-mm incision was made. A Veress needle was introduced into the  peritoneal cavity. Its position in the peritoneal cavity was confirmed with  saline injection. It was then connected to the CO2 insufflator and the  abdomen insufflated to a pressure of 15 mmHg using carbon dioxide. The  Veress needle was then withdrawn and a 500 Endoport was introduced using  visual transparent port with the 500 camera and scope placed within the  lumen to enter the peritoneum under visualization. The inner trocar  was  then removed. The abdomen was examined and no evidence of entry could be  seen.    Next, the epigastrium was infiltrated with 1% lidocaine, and a 12-mm port  was introduced through a transverse incision just below the xiphisternum.  Finally, 2 finder ports were introduced into the right upper quadrant in  the midclavicular/anterior axillary line, each 5 cm below the costal  margin, again, after local anesthesia infiltration. The gallbladder was  seen to be free of attachments. It was grasped. The infundibulum was  retracted serially anteriorly. The infundibulum was also well visualized  and retracted laterally. The peritoneum on either side of the gallbladder  was taken with the use of the hook and cautery. The cystic was readily  identified. It was encircled and divided after clipping it twice on the  liver side and once on the gallbladder side. In a similar fashion, the  cystic artery was also identified. The gallbladder was then dissected away  from the gallbladder fossa. During dissection, a small hole in the  gallbladder was made and some pigmented stones were identified. These were  captured and extracted from the abdomen. The final attachment to the  gallbladder was then taken and the gallbladder removed via the gastric port  without any difficulty.    The abdomen was then irrigated with a liter of saline and suctioned dry. No  evidence of bile leak or bleeding was seen from the gallbladder fossa on  reexamination. The epigastric fascial incision was closed using the  endoclose  in a single pass of #0 Vicryl suture. The abdomen was desufflated  after ports were withdrawn without any signs of bleeding from port sites.  The skin was closed using 4-0 subcuticular Monocryl and Steri-Strips.    The patient tolerated the procedure well, received a total of 1.2 L of  crystalloid. Urine output was not measured. Blood loss was approximately 5  mL. No blood products were given.    Dr. Dwana Curd, the attending surgeon,  was scrubbed and present for the entire  procedure.                          Electronically signed by:  Lysbeth Penner. Dwana Curd, M.D. 08/28/2010 11:23 A    DD: 08/26/2010 DT: 08/26/2010 08:59 P DocNo.: [id number]  [id number].[facility]    Referring Physician:  Viann Shove MD  [address] [place], [place] [address]    Primary Care Physician:  Durward Mallard MD  [address] [place], [place] [address]    cc:

## 2010-08-28 NOTE — Procedures (Signed)
FINAL PATHOLOGIC DIAGNOSIS:  A: Gallbladder, cholecystectomy   -Mild chronic cholecystitis and cholelithiasis.  SPECIMEN(S) SUBMITTED: A: gallbladder  CLINICAL HISTORY: Cholelithiasis.  GROSS DESCRIPTION: A: The specimen (received in formalin, labeled with the  patient's name, medical record number and "gallbladder") consists of a  somewhat disrupted gallbladder measuring 7.0 x 3.0 x 1.5 cm. The cystic  duct is identified and grossly appears patent. The serosa appears tan-pink,  predominantly smooth with the hepatic bed appearing tan-red and cauterized.  There is a 0.5 cm in length defect in the hepatic bed in the body. The  gallbladder is opened to reveal a minimal amount of tan-yellow, somewhat  viscous bile admixed with multiple dark black crushable calculi aggregating  to 1.5 x 1.3 x 0.2 cm. The mucosa appears dark red, smooth and grossly  unremarkable. The wall thickness measures up to 0.2 cm. Representative  sections, including the cystic duct margin and two full thickness sections,  are submitted in cassette A1.  MM/KM/jb  CONFIDENTIAL HEALTH INFORMATION: Health Care information is personal and  sensitive information. If it is being faxed to you it is done so under  appropriate authorization from the patient or under circumstances that do  not require patient authorization. You, the recipient, are obligated to  maintain it in a safe, secure and confidential manner. Re-disclosure  without additional patient consent or as permitted by law is prohibited.  Unauthorized re-disclosure or failure to maintain confidentiality could  subject you to penalties described in federal and state law. If you have  received this report or facsimile in error, please notify the [facility] immediately and destroy the received document(s).   Material reviewed and Interpreted and  Report Electronically Signed by:  Sofie Rower M.D., PhD [phone])  Attending Surgical Pathologist  08/28/10 15:55  Electronic  Signature derived from a single  controlled access password

## 2010-08-31 NOTE — Progress Notes (Signed)
Attending Note:                                                                   Subjective:  I discussed the case with the resident at the time of the clinical session.    I reviewed the entire history.  History of present illness (HPI): Recheck and Ultrasound              Objective:  I reviewed the resident's examination findings.  Of note: BP 149/91   Pulse 117   Temp(Src) 97.7 F (36.5 C) (Oral)   Wt 102.513 kg (226 lb)   and The test results were reviewed with the resident.    Assessment and plan reviewed, discussed and finalized with the resident.   I agree with the resident's plan as documented.    See the resident physician's note for further details.

## 2010-09-04 ENCOUNTER — Ambulatory Visit (HOSPITAL_BASED_OUTPATIENT_CLINIC_OR_DEPARTMENT_OTHER): Admitting: Surgery

## 2010-09-04 VITALS — BP 158/91 | HR 80 | Temp 98.1°F | Resp 14 | Ht 72.0 in | Wt 218.1 lb

## 2010-09-04 NOTE — Progress Notes (Signed)
Doing well after lap chole 9 day ago, healing well, incisions okay, BMs, diet okay.    Refill Vicodin.    RTC prn,

## 2010-09-04 NOTE — Progress Notes (Signed)
TRAUMA CLINIC VISIT    Chief Complaint: Follow-up   post op check s/p lap cholecystectomy on 08/26/10      Date of Admission: 08/26/10  Date of Discharge: 08/26/10    History of Trauma: This is a 44 year old, male who recently had a laparoscopic cholecystectomy for symptomatic cholelithiasis. Pt is doing well post op. Denies fevers, chills, nausea, and vomiting. Reports he is eating a low fat diet, although his appetite is not 100%,  and does c/o some pain daily requiring percocet once a day.     Injuries include: none    Pain:  out of 10.     Activity: as tolerated    Current outpatient prescriptions   Medication Sig Dispense Refill   . alprazolam (XANAX) 2 MG tablet Take 2 mg by mouth daily. 2 tablet once a day       . zolpidem (AMBIEN) 10 MG tablet Take 0.5 tablets by mouth nightly as needed for Insomnia.  30 tablet  0   . ALPRAZolam (XANAX XR PO) 4 mg.       . omeprazole (PRILOSEC) 20 MG capsule Take 20 mg by mouth daily.       . sumatriptan (IMITREX) 100 MG tablet Take 100 mg by mouth once as needed. May repeat in 2 hours in needed             Allergies   Allergen Reactions   . Compazine Anxiety   . Latex Rash           Physical Exam:   Vital signs reviewed BP 158/91  Pulse 80  Temp(Src) 98.1 F (36.7 C) (Oral)  Resp 14  Ht 6' (1.829 m)  Wt 98.92 kg (218 lb 1.3 oz)  BMI 29.58 kg/m2  General appearance: in no apparent distress and well developed and well nourished  General Appearance: healthy, alert, no distress, pleasant affect, cooperative.  Heart:  normal rate and regular rhythm, no murmurs, clicks, or gallops.  Lungs: clear to auscultation and percussion, no chest deformities noted.  Abdomen: BS normal.  Abdomen soft, non-tender.  No masses or organomegaly.  Incision: strei strips in place, slight erythema delineating tape location, no frank erythema, no discharge, no ttp,   Skin:  Skin color, texture, turgor normal. No rashes or lesions.    Procedure: None    Assessment: Mr. Otting is a 44 year old,  gentleman doing well s/p lap cholecystectomy.     Plan:   Activity as tolerated  Return to work Nov 16th  Take percocet and advil prn for pain   Return to clinic PRN    Patient verbalizes his understanding      Pain Management: prn  No orders of the defined types were placed in this encounter.       Return to Office: No furrther follow-up    Worthy Keeler

## 2010-09-04 NOTE — Patient Instructions (Signed)
Allow strei-strips to fall off  Return to work Nov 16  Activity as tolerated  Pain control as needed, continue colace  Return to clinic as needed, no required follow up

## 2010-09-30 ENCOUNTER — Encounter (INDEPENDENT_AMBULATORY_CARE_PROVIDER_SITE_OTHER): Payer: Self-pay | Admitting: Family Medicine

## 2010-09-30 ENCOUNTER — Other Ambulatory Visit (INDEPENDENT_AMBULATORY_CARE_PROVIDER_SITE_OTHER): Payer: Self-pay | Admitting: Family Medicine

## 2010-09-30 MED ORDER — ZOLPIDEM TARTRATE 10 MG OR TABS
5.0000 mg | ORAL_TABLET | Freq: Every evening | ORAL | Status: DC | PRN
Start: 2010-09-30 — End: 2010-11-21

## 2010-09-30 MED ORDER — ZOLPIDEM TARTRATE 10 MG OR TABS
5.0000 mg | ORAL_TABLET | Freq: Every evening | ORAL | Status: DC | PRN
Start: 2010-09-30 — End: 2011-11-17

## 2010-09-30 NOTE — Telephone Encounter (Signed)
From: [name]   To: Isidore Moos, MD   Sent: Mon Sep 30, 2010 10:00 AM   Subject: Medication Renewal Request    Original authorizing provider: Isidore Moos, MD    [name] would like a refill of the following medications:  zolpidem (AMBIEN) 10 MG tablet [Jennifer Barbra Sarks, MD]    Preferred pharmacy: [facility]    Comment:

## 2010-09-30 NOTE — Telephone Encounter (Signed)
From: Brett Palmer   To: [name], MD   Sent: Mon Sep 30, 2010 9:59 AM   Subject: 1-Non Urgent Medical Advice    I would like to have my Ambien prescription refilled.     Thank you,    Brett Palmer

## 2010-10-17 ENCOUNTER — Other Ambulatory Visit (INDEPENDENT_AMBULATORY_CARE_PROVIDER_SITE_OTHER): Payer: Self-pay | Admitting: Family Medicine

## 2010-11-20 ENCOUNTER — Other Ambulatory Visit (INDEPENDENT_AMBULATORY_CARE_PROVIDER_SITE_OTHER): Payer: Self-pay | Admitting: Family Medicine

## 2010-11-21 MED ORDER — ZOLPIDEM TARTRATE 10 MG OR TABS
5.0000 mg | ORAL_TABLET | Freq: Every evening | ORAL | Status: DC | PRN
Start: 2010-11-21 — End: 2011-01-28

## 2010-11-21 NOTE — Telephone Encounter (Signed)
Last office visit 07/16/10. Seen by Dr. Alphonse Guild.

## 2010-11-21 NOTE — Telephone Encounter (Signed)
From: Brett Palmer   To: [name], MD   Sent: Wed Nov 20, 2010 9:40 PM   Subject: Medication Renewal Request    Original authorizing provider: [name], MD    Brett Palmer would like a refill of the following medications:  zolpidem (AMBIEN) 10 MG tablet [name], MD]    Preferred pharmacy: [facility]    Comment:  I am currently working shift work and it is helpful on days I have to be up early.

## 2011-01-28 ENCOUNTER — Other Ambulatory Visit (INDEPENDENT_AMBULATORY_CARE_PROVIDER_SITE_OTHER): Payer: Self-pay | Admitting: Family Medicine

## 2011-01-28 ENCOUNTER — Encounter (INDEPENDENT_AMBULATORY_CARE_PROVIDER_SITE_OTHER): Payer: Self-pay | Admitting: Family Medicine

## 2011-01-28 DIAGNOSIS — G43909 Migraine, unspecified, not intractable, without status migrainosus: Secondary | ICD-10-CM

## 2011-01-28 MED ORDER — ZOLPIDEM TARTRATE 10 MG OR TABS
5.0000 mg | ORAL_TABLET | Freq: Every evening | ORAL | Status: DC | PRN
Start: 2011-01-28 — End: 2011-11-17

## 2011-01-28 MED ORDER — SUMATRIPTAN SUCCINATE 100 MG OR TABS
100.0000 mg | ORAL_TABLET | Freq: Once | ORAL | Status: DC | PRN
Start: 2011-01-28 — End: 2011-11-27

## 2011-01-28 NOTE — Telephone Encounter (Signed)
From: Brett Palmer   To: [name], MD   Sent: Tue Jan 28, 2011 10:15 AM   Subject: Medication Renewal Request    Original authorizing provider: [name], MD    Brett Palmer would like a refill of the following medications:  zolpidem (AMBIEN) 10 MG tablet [name], MD]    Preferred pharmacy: [facility]    Comment:  Still working swing shifts. As a Writer, need to cover sick calls, day and night.

## 2011-01-28 NOTE — Telephone Encounter (Signed)
From: Brett Palmer   To: [name], MD   Sent: Tue Jan 28, 2011 10:17 AM   Subject: 20-Other    I used to have a prescription from Dr. Luster Landsberg for Imitrex. My scrip is expired and Dr. Luster Landsberg is no longer my primary.    Please renew the prescription for imitrex and send to [facility] for refills.    Thank you!    Regards,    Brett Palmer

## 2011-01-28 NOTE — Telephone Encounter (Signed)
Last office visit 07/16/10. Seen by Dr. Alphonse Guild.

## 2011-04-23 ENCOUNTER — Ambulatory Visit (INDEPENDENT_AMBULATORY_CARE_PROVIDER_SITE_OTHER): Payer: Self-pay | Admitting: Family Medicine

## 2011-04-23 ENCOUNTER — Encounter (INDEPENDENT_AMBULATORY_CARE_PROVIDER_SITE_OTHER): Payer: Self-pay | Admitting: Family Medicine

## 2011-04-23 VITALS — BP 150/94 | HR 78 | Temp 97.7°F | Wt 220.0 lb

## 2011-04-23 DIAGNOSIS — R9431 Abnormal electrocardiogram [ECG] [EKG]: Secondary | ICD-10-CM

## 2011-04-23 DIAGNOSIS — G4700 Insomnia, unspecified: Secondary | ICD-10-CM

## 2011-04-23 DIAGNOSIS — F411 Generalized anxiety disorder: Secondary | ICD-10-CM | POA: Insufficient documentation

## 2011-04-23 DIAGNOSIS — F419 Anxiety disorder, unspecified: Secondary | ICD-10-CM

## 2011-04-23 DIAGNOSIS — I10 Essential (primary) hypertension: Secondary | ICD-10-CM | POA: Insufficient documentation

## 2011-04-23 LAB — COMPREHENSIVE METABOLIC PANEL, BLOOD
ALT (SGPT): 144 U/L — ABNORMAL HIGH (ref 0–41)
AST (SGOT): 79 U/L — ABNORMAL HIGH (ref 0–40)
Albumin: 4.9 g/dL (ref 3.5–5.2)
Alkaline Phos: 64 U/L (ref 40–129)
BUN: 13 mg/dL (ref 6–20)
Bicarbonate: 26 mmol/L (ref 22–29)
Bilirubin, Tot: 0.5 mg/dL (ref <1.2)
Calcium: 9.7 mg/dL (ref 8.6–10.5)
Chloride: 102 mmol/L (ref 98–107)
Creatinine: 0.87 mg/dL (ref 0.67–1.17)
Glucose: 100 mg/dL (ref 70–115)
Potassium: 4.1 mmol/L (ref 3.5–5.1)
Sodium: 139 mmol/L (ref 136–145)
Total Protein: 7.6 g/dL (ref 6.0–8.0)

## 2011-04-23 LAB — GFR: GFR: >60 mL/min

## 2011-04-23 LAB — TSH, BLOOD: TSH: 3.92 u[IU]/mL (ref 0.27–4.20)

## 2011-04-23 MED ORDER — ZOLPIDEM TARTRATE 10 MG OR TABS
10.0000 mg | ORAL_TABLET | Freq: Every evening | ORAL | Status: DC | PRN
Start: 2011-04-23 — End: 2011-08-06

## 2011-04-23 MED ORDER — LISINOPRIL 10 MG OR TABS
10.0000 mg | ORAL_TABLET | Freq: Every day | ORAL | Status: DC
Start: 2011-04-23 — End: 2011-10-20

## 2011-04-25 NOTE — Progress Notes (Signed)
Chief Complaint: Palpitations, Stress and Collect Specimen      Subjective:   Brett Palmer is a(n) 45 year old old male who presents for a new problem of he has been having increased stress at work related to demands of being a Production designer, theatre/television/film in the PACU at [employer]. He has noted palpitation, anxiety, insomnia. He feels anxious, nervous and agitated.  Denies chest pressure, nausea, vomiting or diaphoresis, no hx of heart disease.  His blood pressure has been up, never on meds.      Patient Active Problem List   Diagnoses Date Noted   . HTN (hypertension) 04/23/2011   . Anxiety 04/23/2011     Current Outpatient Prescriptions   Medication Sig Dispense Refill   . lisinopril (PRINIVIL, ZESTRIL) 10 MG tablet Take 1 tablet by mouth daily.  30 tablet  3   . zolpidem (AMBIEN) 10 MG tablet Take 1 tablet by mouth nightly as needed for Insomnia.  30 tablet  1   . sumatriptan (IMITREX) 100 MG tablet Take 1 tablet by mouth once as needed. May repeat in 2 hours in needed  10 tablet  1   . zolpidem (AMBIEN) 10 MG tablet Take 0.5 tablets by mouth nightly as needed for Insomnia.  30 tablet  0   . omeprazole (PRILOSEC) 20 MG capsule Take 20 mg by mouth daily.       [name] zolpidem (AMBIEN) 10 MG tablet Take 0.5 tablets by mouth nightly as needed for Insomnia.  30 tablet  0   . alprazolam (XANAX) 2 MG tablet Take 2 mg by mouth daily. 2 tablet once a day       . ALPRAZolam (XANAX XR PO) 4 mg.           ROS:  Constitutional: fatigue.  Ears, Nose, Mouth, Throat: negative.  CV: palpitations.  Resp: negative.  Psych: insomnia and anxiety.    History:  I did review patient's past medical and family/social history.    Objective:   BP 150/94  Pulse 78  Temp(Src) 97.7 F (36.5 C) (Oral)  Wt 99.791 kg (220 lb)     Physical Exam:  General Appearance: healthy, alert, no distress, pleasant affect, cooperative.  Neck:  Neck supple. No adenopathy, thyroid symmetric, normal size.  Heart:  normal rate and regular rhythm, no murmurs, clicks, or gallops.  Lungs:  clear to auscultation . No wheeze or rhonchi.    EKG: nsr, inverted t wave V1-3( no change from prior ekg)    Assessment/Plan:  1. Palpitations (785.1)  ELECTROCARDIOGRAM, TRACING, ECHOCARDIOGRAM, EXERCISE   2. HTN (hypertension) (401.9)  COMPREHENSIVE METABOLIC PANEL, BLOOD, ECHOCARDIOGRAM, EXERCISE, COMPREHENSIVE METABOLIC PANEL, BLOOD   3. Anxiety (300.00)  TSH, BLOOD, COMPREHENSIVE METABOLIC PANEL, BLOOD, TSH, BLOOD, COMPREHENSIVE METABOLIC PANEL, BLOOD   4. Insomnia (780.52)  lisinopril (PRINIVIL, ZESTRIL) 10 MG tablet, zolpidem (AMBIEN) 10 MG tablet   5. Abnormal EKG (794.31)  ECHOCARDIOGRAM, EXERCISE     Check labs.  Start lisinopril for htn  rtc in 1 month.  Check stress echo for hx of abnormal ekg.  If chest pain or shortness of breath, may go to er.   Follow-up in 1 month(s).  Patient Instruction:  See Patient Education section.      Barriers to Learning assessed: none. Patient verbalizes understanding of teaching and instructions.

## 2011-05-06 LAB — ECG, COMPLETE (HC/~~LOC~~/ENCINITAS)
QRS INTERVAL/DURATION: 96 ms
VENTRICULAR RATE: 75 {beats}/min

## 2011-05-28 ENCOUNTER — Other Ambulatory Visit (INDEPENDENT_AMBULATORY_CARE_PROVIDER_SITE_OTHER): Payer: Self-pay | Admitting: Family Medicine

## 2011-05-28 LAB — PB ECHOCARDIOGRAM, EXERCISE

## 2011-08-05 ENCOUNTER — Other Ambulatory Visit (INDEPENDENT_AMBULATORY_CARE_PROVIDER_SITE_OTHER): Payer: Self-pay | Admitting: Family Medicine

## 2011-08-06 MED ORDER — ZOLPIDEM TARTRATE 10 MG OR TABS
10.0000 mg | ORAL_TABLET | Freq: Every evening | ORAL | Status: DC | PRN
Start: 2011-08-06 — End: 2011-11-27

## 2011-08-06 NOTE — Telephone Encounter (Signed)
From: Brett Palmer   To: [name], MD   Sent: Tue Aug 05, 2011 9:49 PM   Subject: Medication Renewal Request    Original authorizing provider: [name], MD    Brett Palmer would like a refill of the following medications:  zolpidem (AMBIEN) 10 MG tablet [name], MD]    Preferred pharmacy: [facility]    Comment:

## 2011-08-06 NOTE — Telephone Encounter (Signed)
Prescription refill request: zolpidem (AMBIEN) 10 MG tablet  Diagnosis: Insomnia  Last Office Visit: 04/23/11  Next Office Visit: 08/11/11  No Shows: 0  Last Refilled: 04/23/11  Recent ED Visit: None  Comment: Refill request for zolpidem (AMBIEN) 10 MG tablet. Medication pending MD approval. Routed to Dr. Tresa Moore.

## 2011-08-11 ENCOUNTER — Encounter (INDEPENDENT_AMBULATORY_CARE_PROVIDER_SITE_OTHER): Payer: Self-pay | Admitting: Family Medicine

## 2011-09-10 ENCOUNTER — Encounter (INDEPENDENT_AMBULATORY_CARE_PROVIDER_SITE_OTHER): Payer: Self-pay | Admitting: Family Medicine

## 2011-09-10 NOTE — Telephone Encounter (Signed)
From: [name]   To: [name], MD   Sent: Wed Sep 10, 2011 2:18 PM   Subject: 20-Other    I would like to set up a Apt because last week I had the same feeling in my chest that I had the stress echo done. 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    Regards  Roanna Epley

## 2011-09-19 NOTE — Telephone Encounter (Signed)
Called 10/12,10/16, and 10/19 to book f/u no answer

## 2011-10-20 ENCOUNTER — Other Ambulatory Visit (INDEPENDENT_AMBULATORY_CARE_PROVIDER_SITE_OTHER): Payer: Self-pay | Admitting: Family Medicine

## 2011-10-20 NOTE — Telephone Encounter (Signed)
From: Brett Palmer   To: [name], MD   Sent: Mon Oct 20, 2011 12:03 PM   Subject: Medication Renewal Request    Original authorizing provider: [name], MD    Brett Palmer would like a refill of the following medications:  lisinopril (PRINIVIL, ZESTRIL) 10 MG tablet [name], MD]    Preferred pharmacy: [facility]    Comment:

## 2011-10-20 NOTE — Telephone Encounter (Signed)
Prescription refill request: lisinopril (PRINIVIL, ZESTRIL) 10 MG tablet  Diagnosis: Insomnia  Last Office Visit: 04/23/11  Next Office Visit: None  No Shows: 0  Last Refilled: 04/23/11  Recent ED Visit: None  Comment: Refill request for lisinopril (PRINIVIL, ZESTRIL) 10 MG tablet. Medication pending MD approval. Routed to Dr. Tresa Moore.    Lab Results   Component Value Date    BUN 13 04/23/2011    CREAT 0.87 04/23/2011    CL 102 04/23/2011    NA 139 04/23/2011    K 4.1 04/23/2011    [place] 9.7 04/23/2011    TBILI 0.5 04/23/2011    ALB 4.9 04/23/2011    TP 7.6 04/23/2011    AST 79 04/23/2011    ALK 64 04/23/2011    BICARB 26 04/23/2011    ALT 144 04/23/2011    GLU 100 04/23/2011

## 2011-10-21 MED ORDER — LISINOPRIL 10 MG OR TABS
10.0000 mg | ORAL_TABLET | Freq: Every day | ORAL | Status: DC
Start: 2011-10-20 — End: 2011-11-27

## 2011-10-21 NOTE — Telephone Encounter (Signed)
Edited to no refills.  Medication initiated 04/2011 - per Dr. Tresa Moore office visit note, patient was to follow-up in 1 month.  Then, in 09/2011 - patient sent mychart stating he needed follow-up due to palpitations/SVT, but did not schedule appointment.  1 month supply of lisinopril sent to pharmacy, but patient needs to schedule follow-up with Dr. Tresa Moore to re-check blood pressure/labs, f/u any palpitation concerns.

## 2011-10-21 NOTE — Telephone Encounter (Signed)
Please call patient and have him schedule appointment within one month with Dr. Tresa Moore.  See note below.  Thank you.

## 2011-10-21 NOTE — Telephone Encounter (Signed)
Patient called. No answer. Left message for patient to call back the clinic. Will follow up.

## 2011-10-22 NOTE — Telephone Encounter (Signed)
Patient called. No answer. Left message for patient to call back the clinic. Will follow up.

## 2011-10-27 NOTE — Telephone Encounter (Signed)
Patient has an appointment on 11/17/11 with Dr. Tresa Moore. Matter was resolved.

## 2011-11-17 ENCOUNTER — Ambulatory Visit (INDEPENDENT_AMBULATORY_CARE_PROVIDER_SITE_OTHER): Payer: HMO | Admitting: Family Medicine

## 2011-11-17 ENCOUNTER — Encounter (INDEPENDENT_AMBULATORY_CARE_PROVIDER_SITE_OTHER): Payer: Self-pay | Admitting: Family Medicine

## 2011-11-17 VITALS — BP 128/82 | HR 94 | Temp 97.9°F | Ht 72.0 in | Wt 218.0 lb

## 2011-11-17 DIAGNOSIS — I10 Essential (primary) hypertension: Secondary | ICD-10-CM

## 2011-11-17 DIAGNOSIS — Z139 Encounter for screening, unspecified: Secondary | ICD-10-CM

## 2011-11-17 DIAGNOSIS — R7401 Elevation of levels of liver transaminase levels: Secondary | ICD-10-CM

## 2011-11-17 LAB — COMPREHENSIVE METABOLIC PANEL, BLOOD
ALT (SGPT): 122 U/L — ABNORMAL HIGH (ref 0–41)
AST (SGOT): 80 U/L — ABNORMAL HIGH (ref 0–40)
Albumin: 4.8 g/dL (ref 3.5–5.2)
Alkaline Phos: 57 U/L (ref 40–129)
BUN: 12 mg/dL (ref 6–20)
Bicarbonate: 27 mmol/L (ref 22–29)
Bilirubin, Tot: 0.5 mg/dL (ref <1.2)
Calcium: 9.4 mg/dL (ref 8.6–10.5)
Chloride: 99 mmol/L (ref 98–107)
Creatinine: 0.76 mg/dL (ref 0.67–1.17)
Glucose: 98 mg/dL (ref 70–115)
Potassium: 4 mmol/L (ref 3.5–5.1)
Sodium: 135 mmol/L — ABNORMAL LOW (ref 136–145)
Total Protein: 7.6 g/dL (ref 6.0–8.0)

## 2011-11-17 LAB — GAMMA GLUTAMYL TRANSFERASE, BLOOD: GGT: 297 U/L — ABNORMAL HIGH (ref 0–60)

## 2011-11-17 LAB — GFR: GFR: >60 mL/min

## 2011-11-17 NOTE — Progress Notes (Signed)
Chief Complaint: Blood Pressure, Knee Pain and Collect Specimen      Subjective:   Brett Palmer is a(n) 45 year old old male who presents for follow up of   1.  Htn. bp is much better at home and work. He gets less than 135/85 on average but he is concerned that the sbp could be creeping up a bit.  .he is willing to check and to get back to me.     2.  lft have been up a bit. He has not had hep screening. He is willing to check.  He does drink 1 bottle of wine.  He knows that he should cut back.  2/4 positve cage :  Cut back, annoyed.     He has cut back in the past and felt better.    3.  [facility].  [facility]s at this time. They are decreasing xanax. Has tried multiple ssri in the past and none were helpful. He has been honest about his drinking with them.     ROS:  Constitutional: negative.  CV: negative.  Resp: negative.  Psych: anxiety.    History:  I did review patient's past medical and social history.    Objective:   BP 128/82  Pulse 94  Temp(Src) 97.9 F (36.6 C) (Oral)  Ht 6' (1.829 m)  Wt 98.884 kg (218 lb)  BMI 29.57 kg/m2     Physical Exam:  General Appearance: healthy, alert, no distress, pleasant affect, cooperative.    Assessment/Plan:  1. Hypertensive disorder (401.9)  COMPREHENSIVE METABOLIC PANEL, BLOOD, COMPREHENSIVE METABOLIC PANEL, BLOOD   2. Transaminitis (790.4)  HEPATITIS C AB, BLOOD, HEPATITIS B SURFACE AB, QUANT, BLOOD, HEPATITIS B SURFACE AG, BLOOD, US ABDOMEN COMPLETE, COMPREHENSIVE METABOLIC PANEL, BLOOD, GAMMA GLUTAMYL TRANSFERASE, BLOOD, HEPATITIS C AB, BLOOD, HEPATITIS B SURFACE AB, QUANT, BLOOD, HEPATITIS B SURFACE AG, BLOOD, COMPREHENSIVE METABOLIC PANEL, BLOOD, GAMMA GLUTAMYL TRANSFERASE, BLOOD   3. Screening (V82.9)  HIV 1/2 ANTIBODY, BLOOD, HIV 1/2 ANTIBODY, BLOOD     Check bp at home, if less than 135/85, acceptable. We can increase lisinopril to 20 mg if needed.  Discussed decreasing etoh. . Is likely affecting bp, lft and anxiety.   Follow-up in 3  month(s).  Patient Instruction:  See Patient Education section.      Barriers to Learning assessed: none. Patient verbalizes understanding of teaching and instructions.

## 2011-11-18 LAB — HEPATITIS B SURFACE AG, BLOOD: HBsAg: NONREACTIVE

## 2011-11-18 LAB — HEPATITIS C AB, BLOOD: Hepatitis C Ab: NONREACTIVE

## 2011-11-18 LAB — HEPATITIS B SURFACE AB, QUANT, BLOOD: HBsAb,Qt: 15 m[IU]/mL

## 2011-11-18 LAB — HIV 1/2 ANTIBODY & P24 ANTIGEN ASSAY, BLOOD: HIV 1/2 Antibody & P24 Antigen Assay: NONREACTIVE

## 2011-11-27 ENCOUNTER — Other Ambulatory Visit (INDEPENDENT_AMBULATORY_CARE_PROVIDER_SITE_OTHER): Payer: Self-pay | Admitting: Family Medicine

## 2011-11-27 MED ORDER — SUMATRIPTAN SUCCINATE 100 MG OR TABS
100.0000 mg | ORAL_TABLET | Freq: Once | ORAL | Status: DC | PRN
Start: 2011-11-27 — End: 2012-04-21

## 2011-11-27 MED ORDER — LISINOPRIL 10 MG OR TABS
10.0000 mg | ORAL_TABLET | Freq: Every day | ORAL | Status: DC
Start: 2011-11-27 — End: 2011-12-23

## 2011-11-27 MED ORDER — ZOLPIDEM TARTRATE 10 MG OR TABS
10.0000 mg | ORAL_TABLET | Freq: Every evening | ORAL | Status: DC | PRN
Start: 2011-11-27 — End: 2012-04-23

## 2011-11-27 NOTE — Telephone Encounter (Signed)
Last office visit 11/17/11

## 2011-11-27 NOTE — Telephone Encounter (Signed)
From: Shugars,Ramses   To: Roseanne Reno, MD   Sent: Thu Nov 27, 2011 11:50 AM   Subject: Medication Renewal Request    Original authorizing provider: Roseanne Reno, MD    Brett Palmer would like a refill of the following medications:  zolpidem (AMBIEN) 10 MG tablet [Melanie Lilia Pro, MD]    Preferred pharmacy: [facility]    Comment:  All three prescriptions need to be renewed. Thank you.    Medication renewals requested in this message routed to other providers:  sumatriptan (IMITREX) 100 MG tablet [name], MD]  lisinopril (PRINIVIL, ZESTRIL) 10 MG tablet [name], MD]

## 2011-11-27 NOTE — Telephone Encounter (Signed)
From: Brett Palmer   To: [name], MD   Sent: Thu Nov 27, 2011 11:50 AM   Subject: Medication Renewal Request    Original authorizing provider: [name], MD    Brett Palmer would like a refill of the following medications:  sumatriptan (IMITREX) 100 MG tablet [name], MD]  lisinopril (PRINIVIL, ZESTRIL) 10 MG tablet [name], MD]    Preferred pharmacy: [facility]    Comment:  All three prescriptions need to be renewed. Thank you.    Medication renewals requested in this message routed to other providers:  zolpidem (AMBIEN) 10 MG tablet [facility] Lilia Pro, MD]

## 2011-12-22 ENCOUNTER — Encounter (INDEPENDENT_AMBULATORY_CARE_PROVIDER_SITE_OTHER): Payer: Self-pay | Admitting: Family Medicine

## 2011-12-23 ENCOUNTER — Other Ambulatory Visit (INDEPENDENT_AMBULATORY_CARE_PROVIDER_SITE_OTHER): Payer: Self-pay | Admitting: Family Medicine

## 2011-12-23 DIAGNOSIS — I10 Essential (primary) hypertension: Secondary | ICD-10-CM

## 2011-12-24 ENCOUNTER — Encounter (INDEPENDENT_AMBULATORY_CARE_PROVIDER_SITE_OTHER): Payer: Self-pay | Admitting: Family Medicine

## 2011-12-24 MED ORDER — LISINOPRIL 10 MG OR TABS
10.0000 mg | ORAL_TABLET | Freq: Every day | ORAL | Status: DC
Start: 2011-12-23 — End: 2012-04-28

## 2012-04-21 ENCOUNTER — Other Ambulatory Visit (INDEPENDENT_AMBULATORY_CARE_PROVIDER_SITE_OTHER): Payer: Self-pay | Admitting: Family Medicine

## 2012-04-21 MED ORDER — SUMATRIPTAN SUCCINATE 100 MG OR TABS
100.0000 mg | ORAL_TABLET | Freq: Once | ORAL | Status: DC | PRN
Start: 2012-04-21 — End: 2012-12-02

## 2012-04-21 NOTE — Telephone Encounter (Signed)
RX refill request from [facility] for Imitrex 100mg  #10   Last filled 11/27/11  Last visit 11/17/11  Rx pended and routed to MD

## 2012-04-23 ENCOUNTER — Other Ambulatory Visit (INDEPENDENT_AMBULATORY_CARE_PROVIDER_SITE_OTHER): Payer: Self-pay | Admitting: Family Medicine

## 2012-04-23 NOTE — Telephone Encounter (Signed)
Last office visit 11/17/11.  Refill request routed to Dr. Toya Smothers covering Dr. Tresa Moore.

## 2012-04-23 NOTE — Telephone Encounter (Signed)
From: Brett Palmer  To: [name], MD  Sent: 04/23/2012 12:13 PM PDT  Subject: Medication Renewal Request    Original authorizing provider: [name], MD    Brett Palmer would like a refill of the following medications:  zolpidem (AMBIEN) 10 MG tablet [name], MD]    Preferred pharmacy: [facility]    Comment:

## 2012-04-27 ENCOUNTER — Telehealth (INDEPENDENT_AMBULATORY_CARE_PROVIDER_SITE_OTHER): Payer: Self-pay | Admitting: Family Medicine

## 2012-04-27 NOTE — Telephone Encounter (Signed)
Medication:  Ambien 10mg  last filled 11/27/2011 for qty # 30 with 1 refill  Last Visit: 11/17/2011 with Dr. Tresa Moore  Next Visit: none sched.  Was instructed on last AVS to be seen in 3 months (march 2013)

## 2012-04-27 NOTE — Telephone Encounter (Signed)
From: [name]  To: [name], MD  Sent: 04/27/2012 2:02 PM PDT  Subject: Medication Renewal Request    Original authorizing provider: [name], MD    [name] would like a refill of the following medications:  zolpidem (AMBIEN) 10 MG tablet [name], MD]    Preferred pharmacy: [facility]    Comment:

## 2012-04-28 ENCOUNTER — Encounter (INDEPENDENT_AMBULATORY_CARE_PROVIDER_SITE_OTHER): Payer: Self-pay | Admitting: Family Medicine

## 2012-04-28 ENCOUNTER — Other Ambulatory Visit (INDEPENDENT_AMBULATORY_CARE_PROVIDER_SITE_OTHER): Payer: Self-pay | Admitting: Family Medicine

## 2012-04-28 MED ORDER — LISINOPRIL 10 MG OR TABS
10.0000 mg | ORAL_TABLET | Freq: Every day | ORAL | Status: DC
Start: 2012-04-28 — End: 2012-08-16

## 2012-04-28 MED ORDER — ZOLPIDEM TARTRATE 10 MG OR TABS
10.0000 mg | ORAL_TABLET | Freq: Every evening | ORAL | Status: DC | PRN
Start: 2012-04-23 — End: 2012-07-09

## 2012-04-28 NOTE — Telephone Encounter (Signed)
Routed message to Dr. Melrose Nakayama for review

## 2012-04-28 NOTE — Telephone Encounter (Signed)
Last office visit 11/17/11

## 2012-04-28 NOTE — Telephone Encounter (Signed)
From: [name]  To: [name], MD  Sent: 04/28/2012 11:43 AM PDT  Subject: 1-Non Urgent Medical Advice    Dear Dr. Tresa Moore,    I have had a migraine the past two days and have called off work today and tomorrow.    Would it be possible for you to send me a note for being out sick and returning on Saturday, June 1st?    I'd appreciate it.    Regards,    [name]

## 2012-04-28 NOTE — Telephone Encounter (Signed)
From: Brett Palmer  To: [name], MD  Sent: 04/28/2012 9:13 AM PDT  Subject: Medication Renewal Request    Original authorizing provider: [name], MD    Brett Palmer would like a refill of the following medications:  lisinopril (PRINIVIL, ZESTRIL) 10 MG tablet [name], MD]  zolpidem (AMBIEN) 10 MG tablet [name], MD]    Preferred pharmacy: [facility]    Comment:

## 2012-04-29 ENCOUNTER — Encounter (INDEPENDENT_AMBULATORY_CARE_PROVIDER_SITE_OTHER): Payer: Self-pay | Admitting: Family Medicine

## 2012-04-29 NOTE — Telephone Encounter (Signed)
From: [name]  To: [name], MD  Sent: 04/29/2012 1:54 PM PDT  Subject: 1-Non Urgent Medical Advice    Thank You.    Regards  Roanna Epley

## 2012-06-15 ENCOUNTER — Encounter (INDEPENDENT_AMBULATORY_CARE_PROVIDER_SITE_OTHER): Payer: Self-pay | Admitting: Family Medicine

## 2012-06-15 DIAGNOSIS — I10 Essential (primary) hypertension: Secondary | ICD-10-CM

## 2012-06-15 NOTE — Telephone Encounter (Signed)
From: [name]  To: [name], MD  Sent: 06/15/2012 12:43 PM PDT  Subject: 1-Non Urgent Medical Advice    Dr Tresa Moore    I have being taking my BP and keeping track with a I-phone app would like you to look at it. Just needed your e-mail address. One of my friends from [facility] look at my BP recorded and said, I should ask you if I can be put on Prinzide also because I have problems with water retention.     Regards  Roanna Epley

## 2012-07-09 ENCOUNTER — Other Ambulatory Visit (INDEPENDENT_AMBULATORY_CARE_PROVIDER_SITE_OTHER): Payer: Self-pay | Admitting: Family Medicine

## 2012-07-09 MED ORDER — ZOLPIDEM TARTRATE 10 MG OR TABS
10.0000 mg | ORAL_TABLET | Freq: Every evening | ORAL | Status: DC | PRN
Start: 2012-07-09 — End: 2012-10-05

## 2012-07-09 NOTE — Telephone Encounter (Signed)
From: Brett Palmer  To: [name], MD  Sent: 07/09/2012 10:38 AM PDT  Subject: Medication Renewal Request    Original authorizing provider: [name], MD    Brett Palmer would like a refill of the following medications:  zolpidem (AMBIEN) 10 MG tablet [name], MD]    Preferred pharmacy: [facility]    Comment:

## 2012-07-09 NOTE — Telephone Encounter (Signed)
Last office visit 11/17/11

## 2012-08-16 ENCOUNTER — Ambulatory Visit (INDEPENDENT_AMBULATORY_CARE_PROVIDER_SITE_OTHER): Payer: Commercial Managed Care - HMO | Admitting: Family Medicine

## 2012-08-16 ENCOUNTER — Encounter (INDEPENDENT_AMBULATORY_CARE_PROVIDER_SITE_OTHER): Payer: Self-pay | Admitting: Family Medicine

## 2012-08-16 VITALS — BP 139/89 | HR 87 | Temp 99.6°F | Ht 72.0 in | Wt 228.0 lb

## 2012-08-16 DIAGNOSIS — B356 Tinea cruris: Secondary | ICD-10-CM

## 2012-08-16 DIAGNOSIS — I10 Essential (primary) hypertension: Secondary | ICD-10-CM

## 2012-08-16 MED ORDER — LISINOPRIL-HYDROCHLOROTHIAZIDE 10-12.5 MG OR TABS
1.0000 | ORAL_TABLET | Freq: Every day | ORAL | Status: DC
Start: 2012-08-16 — End: 2013-01-18

## 2012-08-16 MED ORDER — KETOCONAZOLE 2 % EX CREA
TOPICAL_CREAM | CUTANEOUS | Status: DC
Start: 2012-08-16 — End: 2013-06-21

## 2012-08-16 MED ORDER — HYDROCORTISONE 2.5 % EX CREA
TOPICAL_CREAM | CUTANEOUS | Status: DC
Start: 2012-08-16 — End: 2013-06-21

## 2012-08-16 NOTE — Interdisciplinary (Signed)
Pre-visit chart review and huddle completed with staff and physician.    Outstanding labs, imaging and consults reviewed and identified.    Health maintanence issues identified and addressed:    Health Maintenance   Topic Date Due   . Influenza Vaccine  08/31/2012   . Imm_td/tdap=>46 Yo  07/11/2018

## 2012-08-16 NOTE — Progress Notes (Signed)
Chief Complaint: Rash      Subjective:   Brett Palmer is a(n) 46 year old old male who presents for a new problem of   1. Rash in gluteal fold for 3 weeks. Used otc cream. Not better. It does itch.  No groing rash.    2. He has been taking lisinopril with hctz from a friend and it does better for his bp.    .    ROS:  Constitutional: negative.  Integumentary: rash.    History:  I did review patient's past medical and family/social history.    Objective:   BP 139/89  Pulse 87  Temp(Src) 99.6 F (37.6 C) (Oral)  Ht 6' (1.829 m)  Wt 103.42 kg (228 lb)  BMI 30.92 kg/m2     Physical Exam:  General Appearance: healthy, alert, no distress, pleasant affect, cooperative.  Skin:  Gluteal fold with erythema and scale, no anal lesion. .    Assessment/Plan:  1. Tinea cruris (110.3)  ketoconazole (NIZORAL) 2 % cream, hydrocortisone 2.5 % cream   2. Hypertensive disorder (401.9)  lisinopril-hydrochlorothiazide (PRINZIDE, ZESTORETIC) 10-12.5 MG per tablet   trial of creams bid for 2 week. If not better, rtc.  Change bp med to lisinopril/hctz. Check lytes in 1 month     Follow-up in 1 month(s).  Patient Instruction:  See Patient Education section.      Barriers to Learning assessed: none. Patient verbalizes understanding of teaching and instructions.

## 2012-09-26 ENCOUNTER — Encounter (INDEPENDENT_AMBULATORY_CARE_PROVIDER_SITE_OTHER): Payer: Self-pay | Admitting: Family Medicine

## 2012-09-27 NOTE — Telephone Encounter (Signed)
Message routed to Dr. Grffiths for review.

## 2012-09-27 NOTE — Telephone Encounter (Signed)
From: Brett Palmer  To: [name], MD  Sent: 09/26/2012 11:10 AM PDT  Subject: 1-Non Urgent Medical Advice    [facility] would like me to [facility] this [facility]. but the [facility] will not [facility] unless my primary doctor give a written letter that you are okay with the med. I most certainly would like to try it.              In a small study, [facility] researchers recently found that for people already taking a bedtime dose of the medication prazosin (Minipress), adding a daytime dose helped to reduce overall PTSD symptom severity, as well as stressful responses to trauma reminders.9

## 2012-10-05 ENCOUNTER — Other Ambulatory Visit (INDEPENDENT_AMBULATORY_CARE_PROVIDER_SITE_OTHER): Payer: Self-pay | Admitting: Family Medicine

## 2012-10-05 MED ORDER — ZOLPIDEM TARTRATE 10 MG OR TABS
10.0000 mg | ORAL_TABLET | Freq: Every evening | ORAL | Status: DC | PRN
Start: 2012-10-05 — End: 2013-01-18

## 2012-10-05 NOTE — Telephone Encounter (Signed)
Last office visit 08/16/12.

## 2012-10-05 NOTE — Telephone Encounter (Signed)
From: Brett Palmer  To: [name], MD  Sent: 10/05/2012 3:47 PM PST  Subject: Medication Renewal Request    Original authorizing provider: [name], MD    Brett Palmer would like a refill of the following medications:  zolpidem (AMBIEN) 10 MG tablet [name], MD]    Preferred pharmacy: [facility]    Comment:

## 2012-10-25 ENCOUNTER — Encounter: Payer: Self-pay | Admitting: Hospital

## 2012-11-16 ENCOUNTER — Other Ambulatory Visit: Payer: Commercial Managed Care - HMO | Attending: Family Medicine | Admitting: Sports Medicine

## 2012-11-16 VITALS — BP 139/78 | HR 100 | Temp 98.0°F | Resp 18 | Ht 72.0 in | Wt 232.0 lb

## 2012-11-16 DIAGNOSIS — R000 Tachycardia, unspecified: Secondary | ICD-10-CM

## 2012-11-16 DIAGNOSIS — R002 Palpitations: Secondary | ICD-10-CM | POA: Insufficient documentation

## 2012-11-16 DIAGNOSIS — R42 Dizziness and giddiness: Secondary | ICD-10-CM

## 2012-11-16 DIAGNOSIS — F439 Reaction to severe stress, unspecified: Secondary | ICD-10-CM

## 2012-11-16 LAB — HEMOGRAM, BLOOD
Hct: 42.3 % (ref 40.0–50.0)
Hgb: 14.9 gm/dL (ref 13.7–17.5)
MCH: 30.8 pg (ref 26.0–32.0)
MCHC: 35.2 % (ref 32.0–36.0)
MCV: 87.4 um3 (ref 79.0–95.0)
MPV: 9.3 fL — ABNORMAL LOW (ref 9.4–12.4)
Plt Count: 208 10*3/uL (ref 140–370)
RBC: 4.84 10*6/uL (ref 4.60–6.10)
RDW: 12.4 % (ref 12.0–14.0)
WBC: 5.1 10*3/uL (ref 4.0–10.0)

## 2012-11-16 LAB — COMPREHENSIVE METABOLIC PANEL, BLOOD
ALT (SGPT): 186 U/L — ABNORMAL HIGH (ref 0–41)
AST (SGOT): 124 U/L — ABNORMAL HIGH (ref 0–40)
Albumin: 4.6 g/dL (ref 3.5–5.2)
Alkaline Phos: 66 U/L (ref 40–129)
BUN: 11 mg/dL (ref 6–20)
Bicarbonate: 23 mmol/L (ref 22–29)
Bilirubin, Tot: 0.3 mg/dL (ref <1.2)
Calcium: 9 mg/dL (ref 8.6–10.0)
Chloride: 97 mmol/L — ABNORMAL LOW (ref 98–107)
Creatinine: 0.8 mg/dL (ref 0.67–1.17)
GFR: >60 mL/min
Glucose: 103 mg/dL (ref 70–115)
Potassium: 4 mmol/L (ref 3.5–5.1)
Sodium: 135 mmol/L — ABNORMAL LOW (ref 136–145)
Total Protein: 7.4 g/dL (ref 6.0–8.0)

## 2012-11-16 LAB — TSH, BLOOD: TSH: 2.49 u[IU]/mL (ref 0.27–4.20)

## 2012-11-16 NOTE — Progress Notes (Signed)
FM Clinic Progress Note    SUBJECTIVE:  Brett Palmer is a 46 year old male who presents to clinic for the following:    Chief Complaint   Patient presents with   . 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  Recently was transferred into another position, which has led to a lot of stress  Has had problems with SVT before, has worsened with his new stress - pulse up to 200s at times  Feels like always exhausted now  On Saturday, on his way to [facility] party, started feeling new CP for 10-15 min in center of chest, but this only occurred once  DOE going upstairs, for 2 weeks  Pt thinks all of this is more stress than anything, was in hostile work environment  Had stress echo 05/2011, which was unremarkable - also ordered during time of stress/anxiety in life also linked to stress at work  H/o HTN, otherwise no CV hx    HCM:  UTD    Review of Systems -   Constitutional: fatigue.  CV: palpitations, chest pain.  Resp: negative.  Musculoskeletal: negative.  Neuro: feel faint.  Psych: stress.    I have reviewed the pt's PMH, PSH, Meds, Allergies, Fam Hx, and Social Hx  History:  Patient Active Problem List   Diagnosis   . Hypertensive disorder   . Anxiety     Current Outpatient Prescriptions on File Prior to Visit   Medication Sig Dispense Refill   . alprazolam (XANAX) 2 MG tablet Take 2 mg by mouth daily. 2 tablet once a day       . hydrocortisone 2.5 % cream Apply to affected area twice daily as needed for itching.  60 g  0   . ketoconazole (NIZORAL) 2 % cream Apply a thin layer as directed twice daily  60 Tube  0   . [DISCONTINUED] lisinopril (PRINIVIL, ZESTRIL) 10 MG tablet Take 1 tablet by mouth daily.  30 tablet  5   . lisinopril-hydrochlorothiazide (PRINZIDE, ZESTORETIC) 10-12.5 MG per tablet Take 1 tablet by mouth daily.  30 tablet  5   . omeprazole (PRILOSEC) 20 MG capsule Take 20 mg by mouth daily.       . sumatriptan (IMITREX) 100 MG tablet Take 1 tablet by mouth once as needed for Migraine. May repeat in 2  hours in needed  10 tablet  1   . zolpidem (AMBIEN) 10 MG tablet Take 1 tablet by mouth nightly as needed for Insomnia.  30 tablet  1     No current facility-administered medications on file prior to visit.     Allergies   Allergen Reactions   . Compazine Anxiety   . Latex Rash     Family History   Problem Relation Age of Onset   . Cancer Father      melanoma, died at age 33     History     Social History   . Marital Status: Married     Spouse Name: Brett Palmer     Number of Children: 2   . Years of Education: N/A     Occupational History   . 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 Smoking status: Former Smoker -- 0.50 packs/day for 10 years     Types: Cigarettes     Quit date: 01/11/1990   . Smokeless tobacco: Not on file   .  Alcohol Use: 3.5 - 7 oz/week     7-14 drink(s) per week      wine   . Drug Use: No   . Sexually Active: Yes -- Male partner(s)     Other Topics Concern   . Not on file     Social History Narrative   . No narrative on file       OBJECTIVE:    Filed Vitals:    11/16/12 0902   BP: 139/78   Pulse: 100   Temp: 98 F (36.7 C)   TempSrc: Oral   Resp: 18   Height: 6' (1.829 m)   Weight: 105.235 kg (232 lb)     General Appearance: healthy, alert, appears stressed, pleasant affect, cooperative.  Neck:  Neck supple. No adenopathy.  No carotid bruit.  Heart:  normal rate and regular rhythm, no murmurs, clicks, or gallops.  Lungs: CTAB, no crackles or wheezing  Extremities:  no cyanosis, clubbing, or edema.    Brett Palmer PHQ9 DEPRESSION QUESTIONNAIRE 11/17/2011 11/16/2012   Interest 1 1   Depressed 0 1   Sleep -- 1   Energy -- 1   Appetite -- 1   Failure -- 0   Concentration -- 1   Movement -- 0   Suicide -- 0   Summary(Manual) -- 6   Summary(Calculated) -- 6   Functional -- 1     EKG: per my interpretation  - Rate 92  - RRR  - Nl axis and intervals  - No st elevations/depresssions or TWI  - No blocks  - No chamber enlargement        ASSESMENT AND PLAN:  Ayub was seen today for  fatigue.    Diagnoses and associated orders for this visit:    Palpitations  Possible all linked to stress as listed below.  Had reassuring workup with neg Stress Echo in 05/2011.  Do not think this is MI or angina.  Given h/o possible SVT and recurrent tachycardia and palpitations will get event monitor and consider referral to Cards after  - ECG Tracing, In Clinic  - Hemogram, Blood Lavender; Future  - TSH, Blood Green Plasma Separator Tube; Future  - Comprehensive Metabolic Panel Green Plasma Separator Tube; Future  - Event Monitor; Future    Dizziness  See above  - ECG Tracing, In Clinic  - Hemogram, Blood Lavender; Future  - TSH, Blood Green Plasma Separator Tube; Future  - Comprehensive Metabolic Panel Green Plasma Separator Tube; Future  - Event Monitor; Future    Tachycardia  See above  - ECG Tracing, In Clinic  - Hemogram, Blood Lavender; Future  - TSH, Blood Green Plasma Separator Tube; Future  - Comprehensive Metabolic Panel Green Plasma Separator Tube; Future  - Event Monitor; Future    Stress  Likely acute.  Seeing therapist, no interest in PRN meds.  Continue as is.  - [place] PHQ9 FLOWSHEET    RTC/ED Precautions discussed with Patient and/or Caregiver    Medication Management   Medications reviewed with patient and medication list reconciled.   Over the counter medications, herbal therapies and supplements reviewed.   Patient's understanding and response to medications assessed.   Barriers to medications assessed and addressed.   Risks, benefits, alternatives to medications reviewed.     Barriers to Learning assessed: none. Patient verbalizes understanding of teaching and instructions.     F/U after event monitor    Pt was seen by and discussed with attending Dr. Alphonse Guild  Marcene Corning, MD  [facility] PGY-3  P. [phone]

## 2012-11-16 NOTE — Interdisciplinary (Signed)
Pre-visit chart review and huddle completed with staff and physician.    Outstanding labs, imaging and consults reviewed and identified.    Health maintanence issues identified and addressed:    Health Maintenance   Topic Date Due   . Influenza Vaccine  08/31/2012   . Imm_td/tdap=>46 Yo  07/11/2018

## 2012-11-16 NOTE — Progress Notes (Signed)
Attending Note:    Subjective:  I reviewed the history.  Patient interviewed and examined.  History of present illness (HPI):   Fatigue and palpitations, intermittent, HR up to 150's when checked at work, + anxiety, no stimulant drinks or caffeine, neg stress echo in 2012     Review of Systems (ROS): As per  the resident's note.  Past Medical, Family, Social History:  As per  the resident's  note.    Objective:   I have examined the patient and I concur with the resident's exam.  The test results and ECG were reviewed with the resident.    Assessment and plan reviewed with the resident physician.  I agree with the resident's plan as documented.  Palpitations, anxiety vs arrhythmia, will need further w/u including labs and event monitor  ED and return to clinic precautions discussed with patient including non resolving or worsening symptoms, as well as any other questions or concerns.    See the resident's note for further details.

## 2012-11-17 ENCOUNTER — Telehealth (HOSPITAL_COMMUNITY): Payer: Self-pay | Admitting: Sports Medicine

## 2012-11-17 NOTE — Telephone Encounter (Signed)
CARDIONET EVENT MONITOR ORDERED.

## 2012-11-18 ENCOUNTER — Telehealth (INDEPENDENT_AMBULATORY_CARE_PROVIDER_SITE_OTHER): Payer: Self-pay | Admitting: Sports Medicine

## 2012-11-18 NOTE — Telephone Encounter (Signed)
mychart message sent

## 2012-11-26 ENCOUNTER — Encounter (INDEPENDENT_AMBULATORY_CARE_PROVIDER_SITE_OTHER): Payer: Self-pay | Admitting: Family Medicine

## 2012-11-26 ENCOUNTER — Telehealth (INDEPENDENT_AMBULATORY_CARE_PROVIDER_SITE_OTHER): Payer: Self-pay | Admitting: Family Medicine

## 2012-11-26 NOTE — Telephone Encounter (Signed)
Called pt to discuss symptoms.  States he was already at Urgent Care this morning when he was having symptoms.  He had an EKG which was unremarkable.  Pt is symptom free now and never had CP or SOB.  Reviewed patient's cardionet HR trend chart, which is unremarkable.  Pt instructed to continue wearing event monitor and given ED precautions.  He has f/u sheduled with Dr. Tresa Moore on Monday.

## 2012-11-26 NOTE — Telephone Encounter (Signed)
From: Brett Palmer  To: [name], MD  Sent: 11/26/2012 7:14 AM PST  Subject: 1-Non Urgent Medical Advice    For the past couple of days, my heart rate has been up in the 100-130's, I've been short of breath and feeling very fatigued. I'd like to get a note from you saying that I can take at least three days off starting today to rest and de-stress. I took my BP and it is up there, 151/101 with a pulse of 120. That was when I woke up. I received the cardionet monitor yesterday and I am wearing it now.    My work requires a note. Can you please send that so I can turn it in?    Thank you,    Brett Palmer

## 2012-11-26 NOTE — Telephone Encounter (Addendum)
Noted.  Please forward urgent care referral to me.

## 2012-11-26 NOTE — Telephone Encounter (Signed)
Patient returning Lu's phone call.

## 2012-11-26 NOTE — Telephone Encounter (Signed)
Pt would not go to the ED, and I talked him into the U/C because of his symptoms. 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

## 2012-11-26 NOTE — Telephone Encounter (Signed)
Symptoms are not appropriate for Urgent Care  Referral placed to Emergency Department.  Physicians who care for him will provide work note.  Please call patient and notify of above.   Thank you.    Phoebe Perch, MD  Signature Derived From Controlled Access Password, November 26, 2012, 12:15 PM

## 2012-11-26 NOTE — Telephone Encounter (Signed)
Mychart message forwarded to triage nurse for review.    From: Brett Palmer  To: [name], MD  Sent: 11/26/2012 7:14 AM PST  Subject: 1-Non Urgent Medical Advice    For the past couple of days, my heart rate has been up in the 100-130's, I've been short of breath and feeling very fatigued. I'd like to get a note from you saying that I can take at least three days off starting today to rest and de-stress. I took my BP and it is up there, 151/101 with a pulse of 120. That was when I woke up. I received the cardionet monitor yesterday and I am wearing it now.    My work requires a note. Can you please send that so I can turn it in?    Thank you,    Brett Palmer

## 2012-11-26 NOTE — Telephone Encounter (Signed)
Report ID: [id number] Daily Patient Report 11/25/2012   (TEL) [phone] (FAX) [phone]  Patient Name: Brett Palmer  Date of Birth: 1966/06/29 Gender: M  Patient Phone: [phone]  Patient ID:  MEDICAL RECORD [id number]  Enrollment: 11/25/2012 - 12/01/2012  Prescribing Physician: Alyson Ingles, MD  [facility]  Mail Code [id number]  [address]  [place], [place] [address]  Diagnosis (ICD9): 780.4 Dizziness and giddiness  0  0  0  0  0  0  0  0  0  Heart Rate Trend Chart      Symptomatic Event 11/25/2012 22:09:15 PST Strip ID [id number]  Baseline - Normal Sinus Rhythm  Activities: None Indicated  Symptoms: Light Headed, Fatigue, Heart Racing  Preliminary Findings HR 85 bpm                  Dutil, Tareek 11/25/2012 Card[facility]Net Daily Report  Symptomatic Event 11/25/2012 22:10:26 PST Strip ID [id number]  Normal Sinus Rhythm  Activities: Resting  Symptoms: Light Headed  Preliminary Findings HR 84 bpm  Physician Interpretation  Interpreting Physician  Signature Date  Janowiak, Jcion            Report from Cardionet above, showing pts heart rate in 80's-90's in NSR per report.    Will fax official copy to Dr Archie Endo, ordering provider, message was left with pt.

## 2012-11-26 NOTE — Telephone Encounter (Signed)
Open telephone encounter to sent message to triage nurse for review.

## 2012-11-26 NOTE — Telephone Encounter (Signed)
Spoke with pt he is having tachycardia, sob when speaking and elevated b/p.  Ptis currently being monitored by Cardionet but should be seen as he is symptomatic. Pt given ED precautions but prefers a U/C Pt given info for our contracted U/C. He said he will go today. I will do a referral. Also appt sched with dr Tresa Moore for [facility] 11/29/12. Pt still requesting a note from Dr Tresa Moore for work excuse. Forwarding U/C ref  Dr Tresa Moore to sign.

## 2012-11-27 ENCOUNTER — Emergency Department
Admission: EM | Admit: 2012-11-27 | Discharge: 2012-11-27 | Disposition: A | Discharge status: Home / Self Care | Payer: Commercial Managed Care - HMO | Attending: Emergency Medicine | Admitting: Emergency Medicine

## 2012-11-27 ENCOUNTER — Encounter (HOSPITAL_BASED_OUTPATIENT_CLINIC_OR_DEPARTMENT_OTHER): Payer: Self-pay

## 2012-11-27 DIAGNOSIS — R55 Syncope and collapse: Secondary | ICD-10-CM | POA: Insufficient documentation

## 2012-11-27 DIAGNOSIS — K219 Gastro-esophageal reflux disease without esophagitis: Secondary | ICD-10-CM | POA: Insufficient documentation

## 2012-11-27 DIAGNOSIS — F4310 Post-traumatic stress disorder, unspecified: Secondary | ICD-10-CM | POA: Insufficient documentation

## 2012-11-27 DIAGNOSIS — I10 Essential (primary) hypertension: Secondary | ICD-10-CM | POA: Insufficient documentation

## 2012-11-27 MED ORDER — DIAZEPAM 2 MG OR TABS
2.0000 mg | ORAL_TABLET | Freq: Four times a day (QID) | ORAL | Status: DC | PRN
Start: 2012-11-27 — End: 2012-11-29

## 2012-11-27 MED ORDER — SODIUM CHLORIDE 0.9 % IV BOLUS
1000.0000 mL | INJECTION | Freq: Once | INTRAVENOUS | Status: AC
Start: 2012-11-27 — End: 2012-11-27
  Administered 2012-11-27: 1000 mL via INTRAVENOUS

## 2012-11-27 NOTE — ED Notes (Signed)
Blood samples collected/labeled and sent to the lab.

## 2012-11-27 NOTE — ED Notes (Signed)
12 Lead Completed. Prior 12 Lead EKG found in the system. Result of New and Old 12 Lead EKG was given to MD Dayton Scrape.

## 2012-11-27 NOTE — ED Notes (Signed)
Dr Dayton Scrape at bedside with pt.

## 2012-11-27 NOTE — Discharge Instructions (Signed)
Syncope     You have been diagnosed with syncope (pronounced SINK-uh-pee).     This is the medical term for a rapid loss of consciousness or a fainting episode. There are many causes of syncope. Some of these are life-threatening and others are not serious. Most patients with life-threatening causes are admitted to the hospital for further testing. Patients without life-threatening conditions may be sent home.     Some non-life threatening causes of syncope include dehydration, heat exhaustion, vasovagal events (simple fainting), and side-effects from new medication.     Treatment of syncope depends on the cause. In general it is a good idea to drink plenty of fluids and avoid strenuous activity for at least 24 hours after fainting.     Follow up with your regular doctor within 3 days.     YOU SHOULD SEEK MEDICAL ATTENTION IMMEDIATELY, EITHER HERE OR AT THE NEAREST EMERGENCY DEPARTMENT, IF ANY OF THE FOLLOWING OCCURS:  · You continue to faint (pass out).  · You have any chest pain with your fainting.  · You notice any palpitations or strange heart beats before fainting.  · You notice any blood in your stools. Blood may be bright red or dark red or black and tar-like.  · You feel weak or lightheaded or do not improve as expected.  · You become worse or have other concerns.

## 2012-11-27 NOTE — ED Notes (Signed)
Orthostatics done

## 2012-11-27 NOTE — Consults (Signed)
CC: Syncope    HPI:    46 yo M w/ho HTN, GERD, migraines presenting with syncope last night and near syncopal episode this AM. Pt has been having LH for multiple months, but had never had a syncopal episode until last night. Pt states that he completely lost consciousness for 3-4 seconds while watching TV last night. Pt states that he was laughing very hard, then started coughing, and then passed out.  Pt had some associated SOB and palpitations. Pt was placed on an event monitor as an OP for LH sx, and was wearing the monitor during some LH episodes but not during his syncopal episode. Denies diarrhea, cough, confusion, CP, nausea, vomiting, f/c/ns, cough, le edema, sob. Pt has been eating and drinking as her normally does. Pt does have some anxiety.     He does mention that he thinks that he once had an episode where his HR was in the 200s, but this disappaited while in route to ED.      Pt has had no repeat syncopal episodes while in house and no abnormalities on telemetry.       Past Medical History   Diagnosis Date   . Gastroesophageal reflux disease    . Migraine headache      part of gulf war syndrome   . PTSD (post-traumatic stress disorder) 1995-present     treated by Dr. Ahmed Prima at [facility]    HTN    Past Surgical History   Procedure Laterality Date   . No past surgical history         No current facility-administered medications on file prior to encounter.     Current Outpatient Prescriptions on File Prior to Encounter   Medication Sig Dispense Refill   . alprazolam (XANAX) 2 MG tablet Take 2 mg by mouth daily. 2 tablet once a day       . hydrocortisone 2.5 % cream Apply to affected area twice daily as needed for itching.  60 g  0   . ketoconazole (NIZORAL) 2 % cream Apply a thin layer as directed twice daily  60 Tube  0   . [DISCONTINUED] lisinopril (PRINIVIL, ZESTRIL) 10 MG tablet Take 1 tablet by mouth daily.  30 tablet  5   . lisinopril-hydrochlorothiazide (PRINZIDE, ZESTORETIC) 10-12.5 MG per tablet Take  1 tablet by mouth daily.  30 tablet  5   . omeprazole (PRILOSEC) 20 MG capsule Take 20 mg by mouth daily.       . sumatriptan (IMITREX) 100 MG tablet Take 1 tablet by mouth once as needed for Migraine. May repeat in 2 hours in needed  10 tablet  1   . zolpidem (AMBIEN) 10 MG tablet Take 1 tablet by mouth nightly as needed for Insomnia.  30 tablet  1       Family History   Problem Relation Age of Onset   . Cancer Father      melanoma, died at age 103       History     Social History   . Marital Status: Married     Spouse Name: [name]     Number of Children: 2   . Years of Education: N/A     Occupational History   . 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 Smoking status: Former Smoker -- 0.50 packs/day for 10 years     Types: Cigarettes  Quit date: 01/11/1990   . Smokeless tobacco: Not on file   . Alcohol Use: 3.5 - 7 oz/week     7-14 drink(s) per week      wine   . Drug Use: No   . Sexually Active: Yes -- Male partner(s)     Other Topics Concern   . Not on file     Social History Narrative   . No narrative on file     FH:  No CAD in family  Mom had LH chronically but never with any cause illicitted despite extensive work up    ROS:10 point ROS conducted and negative otherwise stated in HPI    O:  Temperature:  [97.7 F (36.5 C)] 97.7 F (36.5 C) (12/28 1129)  Blood pressure (BP): (134-146)/(84-102) 146/84 mmHg (12/28 1303)  Heart Rate:  [86-100] 86 (12/28 1303)  Respirations:  [11-20] 16 (12/28 1303)  Pain Score:  [-] 0 (12/28 1303)  O2 Device:  [-] None (Room air) (12/28 1303)  SpO2:  [96 %-98 %] 96 % (12/28 1303)    Gen:  in NAD, A & O x 3  HEENT: MMM, OP clear, EOMI  Neck: No LAD, No JVD  CV: RRR, no m/r/g  Lungs: CTAB, no wheezes, rales  Abd: soft, BS+, NT, ND, No CVA tenderness, no guarding\  Ext: No LEE  Skin: no rashes     Labs:  Lab Results   Component Value Date    CPK 76 11/27/2012    CKMBH 1.8 11/27/2012    TROPONIN <0.01 11/27/2012     Lab Results   Component Value  Date    WBC 5.7 11/27/2012    RBC 4.90 11/27/2012    HGB 15.0 11/27/2012    HCT 43.0 11/27/2012    MCV 87.8 11/27/2012    MCHC 34.9 11/27/2012    RDW 12.4 11/27/2012    PLT 207 11/27/2012    MPV 9.6 11/27/2012     Lab Results   Component Value Date    BUN 12 11/27/2012    CREAT 0.76 11/27/2012    CL 97 11/27/2012    NA 137 11/27/2012    K 3.8 11/27/2012    [place] 9.5 11/27/2012    TBILI 0.3 11/27/2012    ALB 4.6 11/27/2012    TP 7.4 11/27/2012    AST 81 11/27/2012    ALK 79 11/27/2012    BICARB 25 11/27/2012    ALT 152 11/27/2012    GLU 103 11/27/2012     Studies:    ECG:  NSR, HR 84, TWI in v1, v3, v4, v5 (Prior ECG with TWI in v1, v2, v3, v4)       Event Monitor 10/2012:     Pt with 3 symptomatic event alarms all with NSR ranging from HR to 80s-110s    Stress Echo 05/2011:    Conclusions  1) Negative for ischemia.  2) Excellent exercise capacity for the patient's age.  3) Normal blood pressure response to exercise.  4) Normal heart rate response to exercise.    A/P:  46 yo M w/ho HTN, GERD, migraines presenting with syncope last night and near syncopal episode this AM. Pt's story and episode possibly 2/2 vasovagal (cough, laughing). Pt has had no events on the monitor and had only sinus tachycardia while feeling LH.   - would continue event monitor  - will order for Echocardiogram as OP to evaluate for HOCM  - will order for Cardiology Clinic follow up  D/w Dr. Allena Napoleon

## 2012-11-27 NOTE — ED Provider Notes (Signed)
Emergency Department Note    CC :   Chief Complaint   Patient presents with   . Chest Pain     currently wearing event monitor - has lightheadedness since yest am, mid-chest pressure sensation - has been worked up/seen for same.  "it's anxiety"  had syncopal episode at 20:00 last night  - was laughing watching tv,  spit out drink, lasted few seconds while sitting.       Name : Brett Palmer  Age : 46 year old  DOB : 1966-02-18  Primary Care : [name]      HPI :   46 year old male with history of his diagnosis of SVT approximately 4 years ago. Patient has been doing well since that time, however, he estimates he has had several episodes of acute onset of lightheadedness.  Currently being evaluated by his primary physician and wearing an event monitor.  States that last night around dinnertime he was watching television.        Past Medical History :   Past Medical History   Diagnosis Date   . Gastroesophageal reflux disease    . Migraine headache      part of gulf war syndrome   . PTSD (post-traumatic stress disorder) 1995-present     treated by Dr. Ahmed Prima at [facility]        Past Surgical history :   Past Surgical History   Procedure Laterality Date   . No past surgical history           Patient's Medications   New Prescriptions    No medications on file   Previous Medications    ALPRAZOLAM (XANAX) 2 MG TABLET    Take 2 mg by mouth daily. 2 tablet once a day    HYDROCORTISONE 2.5 % CREAM    Apply to affected area twice daily as needed for itching.    KETOCONAZOLE (NIZORAL) 2 % CREAM    Apply a thin layer as directed twice daily    LISINOPRIL-HYDROCHLOROTHIAZIDE (PRINZIDE, ZESTORETIC) 10-12.5 MG PER TABLET    Take 1 tablet by mouth daily.    OMEPRAZOLE (PRILOSEC) 20 MG CAPSULE    Take 20 mg by mouth daily.    SUMATRIPTAN (IMITREX) 100 MG TABLET    Take 1 tablet by mouth once as needed for Migraine. May repeat in 2 hours in needed    ZOLPIDEM (AMBIEN) 10 MG TABLET    Take 1 tablet by mouth nightly as needed  for Insomnia.   Modified Medications    No medications on file   Discontinued Medications    No medications on file       Allergies :  Compazine and Latex    Social History :   History     Social History   . Marital Status: Married     Spouse Name: [name]     Number of Children: 2   . Years of Education: N/A     Occupational History   . 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 Smoking status: Former Smoker -- 0.50 packs/day for 10 years     Types: Cigarettes     Quit date: 01/11/1990   . Smokeless tobacco: Not on file   . Alcohol Use: 3.5 - 7 oz/week     7-14 drink(s) per week      wine   . Drug Use: No   . Sexually Active:  Yes -- Male partner(s)     Other Topics Concern   . Not on file     Social History Narrative   . No narrative on file         Family history  :  Family History   Problem Relation Age of Onset   . Cancer Father      melanoma, died at age 30         Review of Systems   GEN : Denies generalized weakness, fatigue  HEENT : Denies vision change, diplopia, dysphagia, photophobia, neck stiffness   PULM : Denies cough, sob, fever   CV : Denies palpitations, chest pain   ABD : Denies abdom pain, N/V/DA,   GU : Denies dysuria, discharge  NEURO: Denies weakness,numbness   MSK : -jt pain , -swelling,   SKIN :  -rash  PSYCH : -SI      Physical Exam :   4    11/27/12  1230 11/27/12  1303 11/27/12  1426 11/27/12  1428   BP: 134/91 146/84 137/95 137/95   Pulse: 89 86 89 90   Temp:    98.3 F (36.8 C)   Resp: 11 16 19 16    SpO2: 98% 96% 92% 95%       GENERAL APPEARANCE: anxious but well appearing, A&O x 3  HEENT: no trauma.  PERRL.  TM normal  NECK: supple, non-tender.  No LAD, thyroid normal size.    THROAT:  Oral cavity clear, moist, pink.  No erythema  CARDIAC:  RRR   CHEST : no deformity or TTP  LUNGS: CTAB with no W/R/R.  Good effort   ABDOMEN: Soft, no TTP, no distention. No masses or organomegaly noted.   BACK: No CVA tenderness. No flank tenderness   EXTREMITIES: No  significant deformity or joint abnormality. No edema. Peripheral pulses intact.   NEUROLOGICAL: CN II-XII intact. Strength and sensation symmetric and intact throughout upper and lower ext.  SKIN: no rashes  PSYCHIATRIC: A&Ox3.  Normal affect.  No SI/HI/AH/VH          LABS  Results for orders placed during the hospital encounter of 11/27/12   CBC WITH ADIFF, BLOOD       Result Value Range    WBC 5.7  4.0 - 10.0 1000/mm3    RBC 4.90  4.60 - 6.10 mill/mm3    Hgb 15.0  13.7 - 17.5 gm/dL    Hct 09.8  11.9 - 14.7 %    MCV 87.8  79.0 - 95.0 um3    MCH 30.6  26.0 - 32.0 pgm    MCHC 34.9  32.0 - 36.0 %    RDW 12.4  12.0 - 14.0 %    MPV 9.6  9.4 - 12.4 fL    Plt Count 207  140 - 370 1000/mm3    Segs 48  34 - 71 %    Lymphocytes 39  19 - 53 %    Monocytes 8  5 - 12 %    Eosinophils 4  1 - 7 %    Absolute Neutrophil Count 2.8  1.6 - 7.0 K/uL    Abs Lymphs 2.3  0.8 - 3.1 1000/mm3    Abs Monos 0.5  0.2 - 0.8 1000/mm3    Abs Eosinophils 0.2  0.0 - 0.5 1000/mm3    Diff Type Automated     COMPREHENSIVE METABOLIC PANEL, BLOOD       Result Value Range    Glucose 103  70 - 115 mg/dL    BUN 12  6 - 20 mg/dL    Creatinine 6.21  3.08 - 1.17 mg/dL    GFR >65      Sodium 137  136 - 145 mmol/L    Potassium 3.8  3.5 - 5.1 mmol/L    Chloride 97 (*) 98 - 107 mmol/L    Bicarbonate 25  22 - 29 mmol/L    Calcium 9.5  8.6 - 10.0 mg/dL    Total Protein 7.4  6.0 - 8.0 g/dL    Albumin 4.6  3.5 - 5.2 g/dL    Bilirubin, Tot 0.3  <1.2 mg/dL    AST (SGOT) 81 (*) 0 - 40 U/L    ALT (SGPT) 152 (*) 0 - 41 U/L    Alkaline Phos 79  40 - 129 U/L   TROPONIN T, BLOOD       Result Value Range    Troponin T <0.01  <0.01 ng/mL   CPK-CREATINE PHOSPHOKINASE, BLOOD       Result Value Range    CPK 76  0 - 175 U/L   CKMB+INDEX (NO CPK) PANEL, BLOOD       Result Value Range    CK-MB 1.8  0.0 - 4.8 ng/mL    CK-MB Index - (*) 0.0 - 2.5 %   D-DIMER HIGHLY SENSITIVE, BLOOD       Result Value Range    D-Dimer HS <150  <241 ng/mL D-DU         Radiology :   Chest XR  FINDINGS /  IMPRESSION:    1. No consolidation, pleural effusion, or pneumothorax demonstrated.    2. Unremarkablecardiomediastinal silhouette.    3. No evidence of acute osseous abnormalities.    4. No evidence of acute cardiopulmonary disease.     I have reviewed the images and agree with the resident's interpretation      Clinical Decision Making   Nursing note reviewed, triage reviewed, old charts reviewed as available.    Report of syncopal episode last night and history of possible SVT.  Currently being worked up and has event monitor on right now.  Last nights episode sounds like a vagal event given his laughter and postural aspect, could be cardiac or stress induced.  He is ECG has inverted T waves laterally that are stable from prior ecg. Cardiac Stress echo in 2012 that was negative.  Will get markers as well as labs, has TSH that was normal recently.  Will have cardiology consult    metabolic labs as ordered   chest film  ecg  Cardiac labs    ED course     Labs are benign, chest film normal, ECG Stable.  Cardiology has sen patient and reviewed event monitor, no events were reportedly present.  They state the patient is safe for discharge, spoke with patient and he believes this as well.  Has close follow up.      Seen with Dr. Margot Chimes, Marene Lenz, MD  Resident  12/03/12 [phone]

## 2012-11-27 NOTE — ED Attending Note (Signed)
ED ATTENDING NOTE:      EMERGENCY DEPARTMENT ATTENDING NOTE     Brett Palmer  MRN: [id number]  DOB: 1966/06/11  PMD: [name]    CC: Chest Pain    Chief Complaint   Patient presents with   . Chest Pain     currently wearing event monitor - has lightheadedness since yest am, mid-chest pressure sensation - has been worked up/seen for same.  "it's anxiety"  had syncopal episode at 20:00 last night  - was laughing watching tv,  spit out drink, lasted few seconds while sitting.         History of Present Illness    Pt seen and examined. Pt is a 46 year old male  has a past medical history of Gastroesophageal reflux disease; Migraine headache; and PTSD (post-traumatic stress disorder) (1995-present). and presents with chest pain. Pt has had recent episodes of light-headedness and dizziness. He has been seen by his PCP for this, and is currently wearing an event monitor. Seems to have episodes where heart races 250-280. Last episode this morning.   Short syncopal episode last night during an episode.   He is otherwise healthy. Does not abuse substances.     Past Medical/Surgical History    PMHx:   Past Medical History   Diagnosis Date   . Gastroesophageal reflux disease    . Migraine headache      part of gulf war syndrome   . PTSD (post-traumatic stress disorder) 1995-present     treated by Dr. Ahmed Prima at [facility]      PSHx:    Past Surgical History   Procedure Laterality Date   . No past surgical history         Outpatient Medications      Prior to Admission Medications   Outpatient Medications Last Dose Informant Patient Reported? Taking?   alprazolam (XANAX) 2 MG tablet   Yes No   Sig: Take 2 mg by mouth daily. 2 tablet once a day   hydrocortisone 2.5 % cream   No No   Sig: Apply to affected area twice daily as needed for itching.   ketoconazole (NIZORAL) 2 % cream   No No   Sig: Apply a thin layer as directed twice daily   lisinopril (PRINIVIL, ZESTRIL) 10 MG tablet   No No   Sig: Take 1 tablet by mouth daily.     lisinopril-hydrochlorothiazide (PRINZIDE, ZESTORETIC) 10-12.5 MG per tablet   No No   Sig: Take 1 tablet by mouth daily.   omeprazole (PRILOSEC) 20 MG capsule   Yes No   Sig: Take 20 mg by mouth daily.   sumatriptan (IMITREX) 100 MG tablet   No No   Sig: Take 1 tablet by mouth once as needed for Migraine. May repeat in 2 hours in needed   zolpidem (AMBIEN) 10 MG tablet   No No   Sig: Take 1 tablet by mouth nightly as needed for Insomnia.      Facility-Administered Medications: None     Patient's Medications   New Prescriptions    No medications on file   Previous Medications    ALPRAZOLAM (XANAX) 2 MG TABLET    Take 2 mg by mouth daily. 2 tablet once a day    HYDROCORTISONE 2.5 % CREAM    Apply to affected area twice daily as needed for itching.    KETOCONAZOLE (NIZORAL) 2 % CREAM    Apply a thin layer as directed twice daily  LISINOPRIL-HYDROCHLOROTHIAZIDE (PRINZIDE, ZESTORETIC) 10-12.5 MG PER TABLET    Take 1 tablet by mouth daily.    OMEPRAZOLE (PRILOSEC) 20 MG CAPSULE    Take 20 mg by mouth daily.    SUMATRIPTAN (IMITREX) 100 MG TABLET    Take 1 tablet by mouth once as needed for Migraine. May repeat in 2 hours in needed    ZOLPIDEM (AMBIEN) 10 MG TABLET    Take 1 tablet by mouth nightly as needed for Insomnia.   Modified Medications    No medications on file   Discontinued Medications    No medications on file       Allergies    Compazine and Latex    Social History       History   Substance Use Topics   . Smoking status: Former Smoker -- 0.50 packs/day for 10 years     Types: Cigarettes     Quit date: 01/11/1990   . Smokeless tobacco: Not on file   . Alcohol Use: 3.5 - 7 oz/week     7-14 drink(s) per week      wine       Family History     family history includes Cancer in his father.    Review of Systems    Constitutional: Negative for fever and chills.  HENT: Negative for congestion and neck pain.  Eyes: Negative for discharge and redness.  Cardiovascular: Negative for chest pain. + for palpitations,  tachycardia  Respiratory: Negative for cough and shortness of breath.  Gastrointestinal: Negative for nausea, vomiting, abdominal pain and diarrhea.  Genitourinary: Negative for dysuria, urgency and frequency.  Musculoskeletal: Negative for back pain.  Neurological: + for syncope  Psychiatric/Behavioral: Negative for substance abuse.    I have reviewed the patient's medical history as available in EPIC. A twelve-point review of systems was negative unless otherwise noted in the HPI        Physical Exam    ED Triage Vitals   Enc Vitals Group      Blood pressure (BP) 11/27/12 1129 134/97 mmHg      Heart Rate 11/27/12 1129 96      Respirations 11/27/12 1129 20      Temperature 11/27/12 1129 97.7 F (36.5 C)      Temp src --       SpO2 11/27/12 1129 97 %      Weight - scale 11/27/12 1129 231 lb (104.781 kg)      Height 11/27/12 1129 6' (1.829 m)      Head Cir --       Peak Flow --       Pain Score --       Pain Loc --       Pain Edu? --       Excl. in GC? --      MOST RECENT VITALS:   Filed Vitals:    11/27/12 1200 11/27/12 1200 11/27/12 1201 11/27/12 1201   BP:  134/92 135/89 134/102   Pulse:  89 93 100   Temp:       Resp:  18 18 18    Height:       Weight:       SpO2: 98% 97% 97% 98%     GEN: nad nontoxic alert and oriented comfortable  HEENT: ncat perrl eomi anicteric mmm o/p clear no lesions or exudate   NECK supple no jvd no c-spine ttp no lad  CV rrr s1 s2 no m  LUNGS ctab  no r/r/w  ABD soft ntnd   BACK no cvat or midline ttp  EXT no edema, no c/c  SKIN no rash, warm and dry  NEURO nl mentation, speech, and gait. maew.  PSYCH nl affect and behavior        MDM / Impression / Plan:      Pt is a 46 year old yo male with syncope and tachycardic episodes. Concern for arrhythmia. Pt is currently stable and asymptomatic. Other ddx of PE as pt has S1, Q3, T3 strain pattern on EKG. Low risk for PE so D-dimer being obtained in addition to cardiac labs, lytes, cxr and will consult cardiology to see if they can iterrigate his  event monitor.     Plan:   Orders Placed This Encounter   Procedures   . CBC w/Diff [place]   . Comprehensive Metabolic Panel Green Plasma Separator Tube   . Troponin T, Blood Green Plasma Separator Tube   . CPK, Blood Green Plasma Separator Tube   . CKMB +Index (No CPK) Green Plasma Separator Tube   . ECG, Complete       RESULTS   Cardiac Monitor: nsr, no ectopy    EKG: unchanged from previous tracings, normal EKG, normal sinus rhythm, no ectopy. No acute ischemic changes. S1, Q3, T3 strain pattern. This is my interpretation.    Imaging    No results found.    Labs      Results for orders placed in visit on 11/16/12   HEMOGRAM, BLOOD       Result Value Range    WBC 5.1  4.0 - 10.0 1000/mm3    RBC 4.84  4.60 - 6.10 mill/mm3    Hgb 14.9  13.7 - 17.5 gm/dL    Hct 16.1  09.6 - 04.5 %    MCV 87.4  79.0 - 95.0 um3    MCH 30.8  26.0 - 32.0 pgm    MCHC 35.2  32.0 - 36.0 %    RDW 12.4  12.0 - 14.0 %    MPV 9.3 (*) 9.4 - 12.4 fL    Plt Count 208  140 - 370 1000/mm3   TSH, BLOOD       Result Value Range    TSH 2.49  0.27 - 4.20 uIU/mL   COMPREHENSIVE METABOLIC PANEL, BLOOD       Result Value Range    Glucose 103  70 - 115 mg/dL    BUN 11  6 - 20 mg/dL    Creatinine 4.09  8.11 - 1.17 mg/dL    GFR >91      Sodium 135 (*) 136 - 145 mmol/L    Potassium 4.0  3.5 - 5.1 mmol/L    Chloride 97 (*) 98 - 107 mmol/L    Bicarbonate 23  22 - 29 mmol/L    Calcium 9.0  8.6 - 10.0 mg/dL    Total Protein 7.4  6.0 - 8.0 g/dL    Albumin 4.6  3.5 - 5.2 g/dL    Bilirubin, Tot 0.3  <1.2 mg/dL    AST (SGOT) 478 (*) 0 - 40 U/L    ALT (SGPT) 186 (*) 0 - 41 U/L    Alkaline Phos 66  40 - 129 U/L       All lab results reviewed. Pertinent findings discussed with the patient. He understands the findings.      Emergency Department Course     Medications   sodium chloride 0.9 % bolus 1,000  mL (1,000 mLs IntraVENOUS New Bag 11/27/12 1154)     Labs all reassuring, and patient has had no further events since arrival.   Cardiology interrogated his event monitor  and there were no tachycardic events. With further investigation of his hx the syncopal episode last night may have been related to coughing forcefully/vasovagal events. D/C to home.     Diagnosis    Anxiety  Vasovagal Syncope     No diagnosis found.    New Prescriptions    No medications on file       Disposition   Home     D/w pt who understands and agrees with plan as stated, all questions answered  Return precautions reviewed and understood

## 2012-11-27 NOTE — ED Notes (Signed)
Relieving priamary RN for lunch break.  Pt remains on bedside cardiac monitor=NSR in 80's ->alarms on and audible.

## 2012-11-29 ENCOUNTER — Ambulatory Visit (INDEPENDENT_AMBULATORY_CARE_PROVIDER_SITE_OTHER): Payer: Commercial Managed Care - HMO | Admitting: Family Medicine

## 2012-11-29 ENCOUNTER — Encounter (INDEPENDENT_AMBULATORY_CARE_PROVIDER_SITE_OTHER): Payer: Self-pay | Admitting: Family Medicine

## 2012-11-29 VITALS — BP 126/88 | HR 99 | Temp 98.2°F | Wt 230.0 lb

## 2012-11-29 DIAGNOSIS — F411 Generalized anxiety disorder: Secondary | ICD-10-CM

## 2012-11-29 DIAGNOSIS — F1010 Alcohol abuse, uncomplicated: Secondary | ICD-10-CM

## 2012-11-29 DIAGNOSIS — F4310 Post-traumatic stress disorder, unspecified: Secondary | ICD-10-CM

## 2012-11-29 DIAGNOSIS — R002 Palpitations: Secondary | ICD-10-CM

## 2012-11-29 DIAGNOSIS — F419 Anxiety disorder, unspecified: Secondary | ICD-10-CM

## 2012-12-01 LAB — ECG 12-LEAD
QRS INTERVAL/DURATION: 94 ms
VENTRICULAR RATE: 84 {beats}/min

## 2012-12-02 ENCOUNTER — Other Ambulatory Visit (INDEPENDENT_AMBULATORY_CARE_PROVIDER_SITE_OTHER): Payer: Self-pay | Admitting: Family Medicine

## 2012-12-03 ENCOUNTER — Telehealth (INDEPENDENT_AMBULATORY_CARE_PROVIDER_SITE_OTHER): Payer: Self-pay | Admitting: Family Medicine

## 2012-12-03 DIAGNOSIS — G43909 Migraine, unspecified, not intractable, without status migrainosus: Secondary | ICD-10-CM

## 2012-12-03 MED ORDER — SUMATRIPTAN SUCCINATE 100 MG OR TABS
100.0000 mg | ORAL_TABLET | Freq: Once | ORAL | Status: DC | PRN
Start: 2012-12-03 — End: 2012-12-14

## 2012-12-03 MED ORDER — SUMATRIPTAN SUCCINATE 100 MG OR TABS
100.0000 mg | ORAL_TABLET | Freq: Once | ORAL | Status: DC | PRN
Start: 2012-12-02 — End: 2012-12-03

## 2012-12-03 NOTE — Telephone Encounter (Signed)
Pt calling to inform has fill out Disability forms on line,    CONFIRMATION # [id number]

## 2012-12-03 NOTE — Telephone Encounter (Signed)
Open error 

## 2012-12-08 ENCOUNTER — Telehealth (INDEPENDENT_AMBULATORY_CARE_PROVIDER_SITE_OTHER): Payer: Self-pay | Admitting: Family Medicine

## 2012-12-08 NOTE — Telephone Encounter (Signed)
Patient is calling to check status of disability form. He wanted to know if they are done?

## 2012-12-09 ENCOUNTER — Encounter (INDEPENDENT_AMBULATORY_CARE_PROVIDER_SITE_OTHER): Payer: Self-pay | Admitting: Family Medicine

## 2012-12-09 NOTE — Telephone Encounter (Signed)
Cardionet EOS report in media.

## 2012-12-10 NOTE — Telephone Encounter (Signed)
From: Brett Palmer  To: [name], MD  Sent: 12/09/2012 12:09 PM PST  Subject: 1-Non Urgent Medical Advice    I am checking to see if the State paperwork was sent from your office. I called earlier this week, and was told I would get a call back, but I never received one.    I am getting a little nervous as this is kind of important.    My wife is going to call today as well.    Thank you for your assistance.    Regards,    Brett Palmer

## 2012-12-10 NOTE — Telephone Encounter (Signed)
Forwarding to Dr. Tresa Moore to check status on disability forms.

## 2012-12-10 NOTE — Telephone Encounter (Signed)
Dr. Tresa Moore, I am trying to cover for [facility] as she was out. Do you know anything about this paperwork?  I will route to you and Gaylyn Rong. Thx, fclvn

## 2012-12-10 NOTE — Telephone Encounter (Signed)
Patient has been waiting for a call regarding his disability forms. Are they completed online? Left a message on 12-04-11 and requested a return call.    CONFIRMATION # [id number]

## 2012-12-11 ENCOUNTER — Encounter (HOSPITAL_BASED_OUTPATIENT_CLINIC_OR_DEPARTMENT_OTHER): Payer: Commercial Managed Care - HMO | Admitting: Cardiology

## 2012-12-13 ENCOUNTER — Encounter: Payer: Self-pay | Admitting: Hospital

## 2012-12-13 ENCOUNTER — Encounter (INDEPENDENT_AMBULATORY_CARE_PROVIDER_SITE_OTHER): Payer: Self-pay | Admitting: Family Medicine

## 2012-12-13 DIAGNOSIS — R002 Palpitations: Secondary | ICD-10-CM | POA: Insufficient documentation

## 2012-12-13 NOTE — Telephone Encounter (Signed)
Done on line today

## 2012-12-13 NOTE — Telephone Encounter (Signed)
From: [name]  To: [name], MD  Sent: 12/13/2012 10:12 AM PST  Subject: 3-Referral Request Status        MAIN MENU  Skip Navigation Links.  Home  Inbox  File a New Claim  Continue a Saved Draft  Manage My Profile  My Claim History       Home      Personal Information  Full Name:  M[facility]ron[facility]  EDD Customer Account Number:  [id number]                Current Disability Insurance Claim(s)  No Results Found    Pending Disability Insurance Claim Application(s)  Form Name Submitted Date Receipt Number Status  [id number] Claim for DI Benefits A  12-03-2012  [id number]  Pending medical provider for

## 2012-12-13 NOTE — Telephone Encounter (Signed)
From: [name]  To: [name], MD  Sent: 12/13/2012 1:21 PM PST  Subject: 1-Non Urgent Medical Advice    Dec 27th date because I from you that date.    Thank you   Regards  Roanna Epley

## 2012-12-13 NOTE — Telephone Encounter (Signed)
Patient calling all he wants is a call back, he's checking status of disability form online.

## 2012-12-13 NOTE — Telephone Encounter (Signed)
Forwarding to Dr. Tresa Moore to check status of on-line disability.Marland KitchenMarland Kitchen

## 2012-12-14 ENCOUNTER — Other Ambulatory Visit (INDEPENDENT_AMBULATORY_CARE_PROVIDER_SITE_OTHER): Payer: Self-pay | Admitting: Family Medicine

## 2012-12-14 MED ORDER — SUMATRIPTAN SUCCINATE 100 MG OR TABS
100.0000 mg | ORAL_TABLET | Freq: Once | ORAL | Status: DC | PRN
Start: 2012-12-14 — End: 2013-05-27

## 2012-12-14 NOTE — Telephone Encounter (Signed)
[facility] calling req pt refill,pharmacy states fax was not received,    sumatriptan (IMITREX) 100 MG tablet [[id number]]

## 2012-12-15 ENCOUNTER — Encounter: Payer: Self-pay | Admitting: Hospital

## 2012-12-15 ENCOUNTER — Encounter (INDEPENDENT_AMBULATORY_CARE_PROVIDER_SITE_OTHER): Payer: Self-pay | Admitting: Family Medicine

## 2012-12-15 DIAGNOSIS — F4310 Post-traumatic stress disorder, unspecified: Secondary | ICD-10-CM | POA: Insufficient documentation

## 2012-12-15 DIAGNOSIS — F1010 Alcohol abuse, uncomplicated: Secondary | ICD-10-CM | POA: Insufficient documentation

## 2012-12-15 NOTE — Telephone Encounter (Signed)
From: Brett Palmer  To: [name], MD  Sent: 12/15/2012 10:36 AM PST  Subject: 1-Non Urgent Medical Advice    Hi Dr. Tresa Moore,    I received a message from the [facility] that said my paperwork needs to show it was not a work caused issue. I know we went back and forth on this, not knowing if that was the correct thing to input. If you could change it to a non-work caused issue, that would be best.    Also, I am going to be seeing my VA doctor in February and would like to see if you can extend my leave until Mar 3. This will give me time to adjust to the new meds he wants to try out.   Thank you.    Brett Palmer    ----- Message -----  From: [name], MD  Sent: 12/13/2012 12:02 PM PST  To: Brett Palmer  Subject: RE: 1-Non Urgent Medical Advice    Hello  I believe I need to do this on line. Are you using 12/17 at your initial date of leave and a return to work on feb 3?  Let me know and I will get it done today.   [name], MD  Signature Derived From Controlled Access Password, December 13, 2012, 12:02 PM       ----- Message -----   From: Brett Palmer   Sent: 12/09/2012 12:09 PM PST   To: [name], MD  Subject: 1-Non Urgent Medical Advice    I am checking to see if the [facility] paperwork was sent from your office. I called earlier this week, and was told I would get a call back, but I never received one.    I am getting a little nervous as this is kind of important.    My wife is going to call today as well.    Thank you for your assistance.    Regards,    Brett Palmer

## 2012-12-15 NOTE — Progress Notes (Signed)
Chief Complaint: ER F/U, Palpitations and Anxiety      Subjective:   Brett Palmer is a(n) 47 year old old male who presents for follow up of palpitations. He has been seen in th e office and the er and all has been normal thus far. He is scheduled to see cardio.   He has had increased stress at work. 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 He reports that hse is going to lose his job and is not sure if he will be transferred to another role.    He is managed at the [facility].    He does drink 1 bottle of wine per day.  His wife is with him today and states that she does keep an eye on him and does not let him have more than that.  He does not think that 1 bottle of wine per day is too much. He is not interested in cutting back.   .    ROS:  Constitutional: negative.  CV: palpitations.  Psych: anxiety.    History:  Reviewed pmh and social hx     Objective:   BP 126/88  Pulse 99  Temp(Src) 98.2 F (36.8 C) (Oral)  Wt 104.327 kg (230 lb)  BMI 31.19 kg/m2     Physical Exam:  General Appearance: healthy, alert, no distress, pleasant affect, cooperative,   Mental Status: Appearance/Cooperation: in no apparent distress  Attitude: pleasant   Behavior :psychomotor agitation  Eye Contact: normal  Attention Span: good  Speech: normal volume, rate, and pitch  Mood (pt's report) :anxiety  Affect: full and appropriate  Suicidal Ideation: no    GAD 7 11/29/2012   Feeling afraid as if something awful might happen 3   Being easily annoyed or irritable 3   Worrying too much about different things 3   If you checked off any problems, how difficult have these problems made it for you to do your job along with other people? Extremely difficult   Feeling nervous, anxious or on edge 3   Trouble relaxing 3   Being so restless that it is hard to sit still 3   Total 21   Not being able to stop or control worrying 3     [place] PHQ9 DEPRESSION QUESTIONNAIRE 11/17/2011 11/16/2012 11/29/2012 11/29/2012   Interest  1 1 1 3    Depressed 0 1 1 2    Sleep -- 1 -- 3   Energy -- 1 -- 3   Appetite -- 1 -- 3   Failure -- 0 -- 3   Concentration -- 1 -- 3   Movement -- 0 -- 2   Suicide -- 0 -- 0   Summary(Manual) -- 6 -- --   Summary(Calculated) -- 6 -- 22   Functional -- 1 -- 3           Assessment/Plan:  1. Anxiety (300.00)    2. Palpitations (785.1)    3. ETOH abuse (305.00)    4. PTSD (post-traumatic stress disorder) (309.81)    discussed symptoms and that anxiety is likely cause of palpitations. Also stressed that etoh is not helping and making it worse for him.   He is not ready to cut back or quit at this time.   He will follow up with psych at [facility] clinic.   He will keep his cardio visit.  Will use short term disability for leave.       Follow-up  in 1 month(s).  Patient Instruction:  See Patient Education section.      Barriers to Learning assessed: none. Patient verbalizes understanding of teaching and instructions.

## 2012-12-16 ENCOUNTER — Other Ambulatory Visit (INDEPENDENT_AMBULATORY_CARE_PROVIDER_SITE_OTHER): Payer: Commercial Managed Care - HMO

## 2012-12-17 LAB — ECG 12-LEAD
QRS INTERVAL/DURATION: 90 ms
VENTRICULAR RATE: 92 {beats}/min

## 2012-12-22 ENCOUNTER — Encounter (INDEPENDENT_AMBULATORY_CARE_PROVIDER_SITE_OTHER): Payer: Self-pay | Admitting: Family Medicine

## 2012-12-22 NOTE — Telephone Encounter (Signed)
From: Brett Palmer  To: [name], MD  Sent: 12/22/2012 4:56 PM PST  Subject: 1-Non Urgent Medical Advice    Good Afternoon Dr. Tresa Moore,    I received notice that you sent in my extension for disability for 03/03/2013. That is perfect. If I could get a letter from you stating that my leave has been extended to 03/03/2013 for my work, I would greatly appreciate it.    Regards,    Brett Palmer

## 2012-12-24 NOTE — Telephone Encounter (Signed)
Patient calling to check on the status of his request for a letter

## 2012-12-27 ENCOUNTER — Encounter (INDEPENDENT_AMBULATORY_CARE_PROVIDER_SITE_OTHER): Payer: Self-pay | Admitting: Family Medicine

## 2012-12-27 NOTE — Telephone Encounter (Signed)
From: [name]  To: [name], MD  Sent: 12/27/2012 8:52 AM PST  Subject: 1-Non Urgent Medical Advice    Good Morning   DR Merrie Roof  My work is needs a letter that I can send to them that show I'll be out on medical leave tell April 3rd 2014.  Sorry to bother about this but they called me and ask me to get one asap.    Best Regards  Loraine Leriche [facility]

## 2012-12-27 NOTE — Telephone Encounter (Signed)
Message routed to Dr. Griffiths.

## 2012-12-28 NOTE — Telephone Encounter (Signed)
Patient is inquiring on status of letter. His HR dept from work is requiring a letter from PCP on to why he needs to be out until Apri 3, 2014. If the letter is needed to be turned in by Friday and patient has been sending messages and no one has contacted him back. If letter not received by Friday he may be terminated from his job. Please advise

## 2012-12-29 ENCOUNTER — Encounter (INDEPENDENT_AMBULATORY_CARE_PROVIDER_SITE_OTHER): Payer: Self-pay | Admitting: Family Medicine

## 2012-12-29 NOTE — Telephone Encounter (Signed)
Patient needs a letter from PCP stating that his medical leave has been extended to April 3. He has to turn it in by this Friday or HR told him that he will be terminated.    Please fax to his HR [phone]  Attention to [facility]    Please release letter to the patient's MyChart

## 2012-12-29 NOTE — Telephone Encounter (Signed)
From: [name]  To: [name], MD  Sent: 12/29/2012 1:22 PM PST  Subject: 1-Non Urgent Medical Advice    Thank you   I saw my VA Doctor today, and have a follow up apt before I return to work and will continue working with the [place] for my PTSD.    Regards  Brett Palmer    ----- Message -----  From: [name], MD  Sent: 12/29/2012 11:17 AM PST  To: [name]  Subject: RE: 1-Non Urgent Medical Advice    I wrote the letter. You should be able to print it from home. If not, you may pick up a copy at the front desk. Also, during this time, you need to be receiving therapy/counseling etc. Please let me know if you have been following up with a counselor at the [place] or if you would rather see one at my office.  [name], MD  Signature Derived From Controlled Access Password, December 29, 2012, 11:17 AM      ----- Message -----   From: [name]   Sent: 12/27/2012 8:52 AM PST   To: [name], MD  Subject: 1-Non Urgent Medical Advice    Good Morning   DR Merrie Roof  My work is needs a letter that I can send to them that show I'll be out on medical leave tell April 3rd 2014.  Sorry to bother about this but they called me and ask me to get one asap.    Best Regards  Brett Palmer [facility]

## 2013-01-17 ENCOUNTER — Other Ambulatory Visit (INDEPENDENT_AMBULATORY_CARE_PROVIDER_SITE_OTHER): Payer: Self-pay | Admitting: Family Medicine

## 2013-01-18 MED ORDER — LISINOPRIL-HYDROCHLOROTHIAZIDE 10-12.5 MG OR TABS
1.0000 | ORAL_TABLET | Freq: Every day | ORAL | Status: DC
Start: 2013-01-18 — End: 2013-10-07

## 2013-01-18 MED ORDER — ZOLPIDEM TARTRATE 10 MG OR TABS
10.0000 mg | ORAL_TABLET | Freq: Every evening | ORAL | Status: DC | PRN
Start: 2013-01-18 — End: 2013-05-27

## 2013-01-18 NOTE — Telephone Encounter (Signed)
From: [name]  To: [name], MD  Sent: 01/17/2013 11:43 AM PST  Subject: Medication Renewal Request    Original authorizing provider: [name], MD    [name] would like a refill of the following medications:  lisinopril-hydrochlorothiazide (PRINZIDE, ZESTORETIC) 10-12.5 MG per tablet [name], MD]  zolpidem (AMBIEN) 10 MG tablet [name], MD]    Preferred pharmacy: [facility] [address]    Comment:

## 2013-01-18 NOTE — Telephone Encounter (Signed)
Last office visit 11/29/12.

## 2013-02-22 ENCOUNTER — Encounter (INDEPENDENT_AMBULATORY_CARE_PROVIDER_SITE_OTHER): Payer: Self-pay | Admitting: Family Medicine

## 2013-02-22 ENCOUNTER — Ambulatory Visit (INDEPENDENT_AMBULATORY_CARE_PROVIDER_SITE_OTHER): Payer: Commercial Managed Care - HMO | Admitting: Family Medicine

## 2013-02-22 VITALS — BP 131/83 | HR 119 | Temp 97.5°F | Ht 72.0 in | Wt 245.0 lb

## 2013-02-22 DIAGNOSIS — F419 Anxiety disorder, unspecified: Secondary | ICD-10-CM

## 2013-02-22 DIAGNOSIS — F411 Generalized anxiety disorder: Secondary | ICD-10-CM

## 2013-02-22 DIAGNOSIS — S99919A Unspecified injury of unspecified ankle, initial encounter: Secondary | ICD-10-CM

## 2013-02-22 DIAGNOSIS — S99929A Unspecified injury of unspecified foot, initial encounter: Secondary | ICD-10-CM

## 2013-02-22 DIAGNOSIS — F4310 Post-traumatic stress disorder, unspecified: Secondary | ICD-10-CM

## 2013-02-22 DIAGNOSIS — I10 Essential (primary) hypertension: Secondary | ICD-10-CM

## 2013-02-22 MED ORDER — HYDROCODONE-ACETAMINOPHEN 5-325 MG OR TABS
1.0000 | ORAL_TABLET | Freq: Four times a day (QID) | ORAL | Status: DC | PRN
Start: 2013-02-22 — End: 2013-02-22

## 2013-02-22 MED ORDER — ALPRAZOLAM 1 MG OR TABS
1.00 mg | ORAL_TABLET | Freq: Three times a day (TID) | ORAL | Status: DC | PRN
Start: 2013-02-22 — End: 2014-11-06

## 2013-02-22 MED ORDER — TRAMADOL HCL 50 MG OR TABS
50.00 mg | ORAL_TABLET | Freq: Four times a day (QID) | ORAL | Status: AC | PRN
Start: 2013-02-22 — End: 2013-03-04

## 2013-02-22 MED ORDER — PRAZOSIN HCL 1 MG OR CAPS
4.00 mg | ORAL_CAPSULE | Freq: Every evening | ORAL | Status: DC
Start: 2013-02-22 — End: 2015-03-07

## 2013-03-03 ENCOUNTER — Telehealth (INDEPENDENT_AMBULATORY_CARE_PROVIDER_SITE_OTHER): Payer: Self-pay | Admitting: Family Medicine

## 2013-03-04 NOTE — Telephone Encounter (Signed)
This RN returned pt call, no answer unable to leave msg.

## 2013-03-10 NOTE — Telephone Encounter (Signed)
This RN called pt, no answer unable to leave msg. Multiple attempts to contact have been made but unsuccessful, encounter closed d/t unable to get in touch with pt, pt not returning calls. Msg forwarded to PCP.     Rodney Booze. RN

## 2013-03-21 ENCOUNTER — Encounter (INDEPENDENT_AMBULATORY_CARE_PROVIDER_SITE_OTHER): Payer: Self-pay | Admitting: Family Medicine

## 2013-03-22 NOTE — Telephone Encounter (Signed)
Message routed to Dr. Griffiths for review.

## 2013-03-22 NOTE — Telephone Encounter (Signed)
From: [name]  To: [name], MD  Sent: 03/21/2013 7:08 PM PDT  Subject: 1-Non Urgent Medical Advice    Dr. Tresa Moore,    I am currently in the process of resigning at [employer]. Human Resources has been very considerate of my stress leave. They do need a letter from you saying that I returned on April 3rd, but due to circumstances, returned to stress leave April 3rd until today.    This will not be sent to [facility] as I will not be continuing disability. I know it is a little odd but it will make the process of my resignation much smoother.    Thank you.    Brett Palmer

## 2013-04-18 ENCOUNTER — Encounter (INDEPENDENT_AMBULATORY_CARE_PROVIDER_SITE_OTHER): Payer: Self-pay | Admitting: Family Medicine

## 2013-05-27 ENCOUNTER — Other Ambulatory Visit (INDEPENDENT_AMBULATORY_CARE_PROVIDER_SITE_OTHER): Payer: Self-pay | Admitting: Family Medicine

## 2013-05-27 NOTE — Telephone Encounter (Signed)
Message routed to Dr. Gloriann Loan, covering for Dr. Tresa Moore.  Last office viist 02/22/13.

## 2013-05-27 NOTE — Telephone Encounter (Signed)
From: [name]  To: [name], MD  Sent: 05/27/2013 3:52 PM PDT  Subject: Medication Renewal Request    Original authorizing provider: [name], MD    [name] would like a refill of the following medications:  sumatriptan (IMITREX) 100 MG tablet [name], MD]  zolpidem (AMBIEN) 10 MG tablet [name], MD]    Preferred pharmacy: [facility] [address]    Comment:

## 2013-05-30 MED ORDER — SUMATRIPTAN SUCCINATE 100 MG OR TABS
100.0000 mg | ORAL_TABLET | Freq: Once | ORAL | Status: DC | PRN
Start: 2013-05-27 — End: 2013-08-02

## 2013-05-30 MED ORDER — ZOLPIDEM TARTRATE 10 MG OR TABS
10.0000 mg | ORAL_TABLET | Freq: Every evening | ORAL | Status: DC | PRN
Start: 2013-05-27 — End: 2013-08-23

## 2013-06-21 ENCOUNTER — Other Ambulatory Visit (INDEPENDENT_AMBULATORY_CARE_PROVIDER_SITE_OTHER): Payer: Self-pay | Admitting: Family Medicine

## 2013-06-21 ENCOUNTER — Encounter (INDEPENDENT_AMBULATORY_CARE_PROVIDER_SITE_OTHER): Payer: Self-pay | Admitting: Family Medicine

## 2013-06-21 MED ORDER — HYDROCORTISONE 2.5 % EX CREA
TOPICAL_CREAM | CUTANEOUS | Status: DC
Start: 2013-06-21 — End: 2013-08-02

## 2013-06-21 MED ORDER — KETOCONAZOLE 2 % EX CREA
TOPICAL_CREAM | CUTANEOUS | Status: DC
Start: 2013-06-21 — End: 2013-08-02

## 2013-06-21 NOTE — Telephone Encounter (Signed)
Last office visit 02/22/13.

## 2013-06-21 NOTE — Telephone Encounter (Signed)
From: [name]  To: [name], MD  Sent: 06/21/2013 12:09 PM PDT  Subject: 1-Non Urgent Medical Advice    How do I get a complete copy of my medical chart. [facility] needs a copy.  Regards   Roanna Epley

## 2013-06-21 NOTE — Telephone Encounter (Signed)
From: [name]  To: [name], MD  Sent: 06/21/2013 12:10 PM PDT  Subject: Medication Renewal Request    Original authorizing provider: [name], MD    [name] would like a refill of the following medications:  ketoconazole (NIZORAL) 2 % cream [name], MD]  hydrocortisone 2.5 % cream [name], MD]    Preferred pharmacy: [facility] [address]    Comment:

## 2013-08-02 ENCOUNTER — Other Ambulatory Visit (INDEPENDENT_AMBULATORY_CARE_PROVIDER_SITE_OTHER): Payer: Self-pay | Admitting: Family Medicine

## 2013-08-02 ENCOUNTER — Other Ambulatory Visit (INDEPENDENT_AMBULATORY_CARE_PROVIDER_SITE_OTHER): Payer: Self-pay | Admitting: General Practice

## 2013-08-02 MED ORDER — SUMATRIPTAN SUCCINATE 100 MG OR TABS
100.0000 mg | ORAL_TABLET | Freq: Once | ORAL | Status: DC | PRN
Start: 2013-08-02 — End: 2013-10-17

## 2013-08-02 NOTE — Telephone Encounter (Signed)
 From: Brett Palmer  To: Corbett Desanctis, MD  Sent: 08/02/2013 9:08 AM PDT  Subject: Medication Renewal Request    Original authorizing provider: Corbett Desanctis, MD    Brett Palmer would like a refill of the following medications:  ketoconazole  (NIZORAL ) 2 % cream Corbett Desanctis, MD]  hydrocortisone  2.5 % cream Corbett Desanctis, MD]    Preferred pharmacy: [facility] [address]    Comment:

## 2013-08-02 NOTE — Telephone Encounter (Signed)
 From: Sherald Dienes  To: Marquette Sites Brooksie Cap, MD  Sent: 08/02/2013 9:50 AM PDT  Subject: Medication Renewal Request    Original authorizing provider: Berna Breslow, MD    Sherald Dienes would like a refill of the following medications:  sumatriptan  (IMITREX ) 100 MG tablet [[name], MD]    Preferred pharmacy: [facility] [address]    Comment:

## 2013-08-03 MED ORDER — HYDROCORTISONE 2.5 % EX CREA
TOPICAL_CREAM | CUTANEOUS | Status: DC
Start: 2013-08-02 — End: 2013-08-22

## 2013-08-03 MED ORDER — KETOCONAZOLE 2 % EX CREA
TOPICAL_CREAM | CUTANEOUS | Status: DC
Start: 2013-08-02 — End: 2013-08-22

## 2013-08-03 NOTE — Telephone Encounter (Signed)
Last office visit 02/22/13

## 2013-08-21 ENCOUNTER — Other Ambulatory Visit (INDEPENDENT_AMBULATORY_CARE_PROVIDER_SITE_OTHER): Payer: Self-pay | Admitting: Family Medicine

## 2013-08-21 ENCOUNTER — Other Ambulatory Visit (INDEPENDENT_AMBULATORY_CARE_PROVIDER_SITE_OTHER): Payer: Self-pay | Admitting: General Practice

## 2013-08-22 NOTE — Telephone Encounter (Signed)
Last office visit 02/22/13.

## 2013-08-22 NOTE — Telephone Encounter (Signed)
 From: Brett Palmer  To: Brett Mattocks, MD  Sent: 08/21/2013 11:54 AM PDT  Subject: Medication Renewal Request    Original authorizing provider: Jacquetta Mattocks, MD    Brett Palmer would like a refill of the following medications:  ketoconazole  (NIZORAL ) 2 % cream [Melanie Fiorella, MD]  hydrocortisone  2.5 % cream Brett Mattocks, MD]    Preferred pharmacy: [facility] [address]    Comment:      Medication renewals requested in this message routed to other providers:  zolpidem  (AMBIEN ) 10 MG tablet [[name], MD]

## 2013-08-23 MED ORDER — KETOCONAZOLE 2 % EX CREA
TOPICAL_CREAM | CUTANEOUS | Status: DC
Start: 2013-08-22 — End: 2015-03-07

## 2013-08-23 MED ORDER — HYDROCORTISONE 2.5 % EX CREA
TOPICAL_CREAM | CUTANEOUS | Status: DC
Start: 2013-08-22 — End: 2015-03-07

## 2013-08-23 NOTE — Telephone Encounter (Signed)
Rx pended, routed to MD for review.

## 2013-08-23 NOTE — Telephone Encounter (Signed)
 From: Sherald Dienes  To: Marquette Sites Brooksie Cap, MD  Sent: 08/21/2013 11:54 AM PDT  Subject: Medication Renewal Request    Original authorizing provider: Berna Breslow, MD    Sherald Dienes would like a refill of the following medications:  zolpidem  (AMBIEN ) 10 MG tablet [[name], MD]    Preferred pharmacy: [facility] [address]    Comment:      Medication renewals requested in this message routed to other providers:  ketoconazole  (NIZORAL ) 2 % cream [Melanie Fiorella, MD]  hydrocortisone  2.5 % cream Jacquetta Mattocks, MD]

## 2013-08-24 MED ORDER — ZOLPIDEM TARTRATE 10 MG OR TABS
10.0000 mg | ORAL_TABLET | Freq: Every evening | ORAL | Status: DC | PRN
Start: 2013-08-23 — End: 2013-10-12

## 2013-10-07 ENCOUNTER — Other Ambulatory Visit (INDEPENDENT_AMBULATORY_CARE_PROVIDER_SITE_OTHER): Payer: Self-pay | Admitting: General Practice

## 2013-10-07 ENCOUNTER — Other Ambulatory Visit (INDEPENDENT_AMBULATORY_CARE_PROVIDER_SITE_OTHER): Payer: Self-pay | Admitting: Family Medicine

## 2013-10-07 MED ORDER — LISINOPRIL-HYDROCHLOROTHIAZIDE 10-12.5 MG OR TABS
1.0000 | ORAL_TABLET | Freq: Every day | ORAL | Status: DC
Start: 2013-10-07 — End: 2013-12-21

## 2013-10-07 NOTE — Telephone Encounter (Signed)
From: [name]  To: [name], MD  Sent: 10/07/2013 9:15 AM PST  Subject: Medication Renewal Request    Original authorizing provider: [name], MD    [name] would like a refill of the following medications:  lisinopril-hydrochlorothiazide (PRINZIDE, ZESTORETIC) 10-12.5 MG per tablet [name], MD]    Preferred pharmacy: [facility] [address]    Comment:      Medication renewals requested in this message routed to other providers:  zolpidem (AMBIEN) 10 MG tablet [[name], MD]

## 2013-10-07 NOTE — Telephone Encounter (Signed)
From: [name]  To: Gloriann Loan Ernest Pine, MD  Sent: 10/07/2013 9:15 AM PST  Subject: Medication Renewal Request    Original authorizing provider: Franciso Bend, MD    [name] would like a refill of the following medications:  zolpidem (AMBIEN) 10 MG tablet [[name], MD]    Preferred pharmacy: [facility] [address]    Comment:      Medication renewals requested in this message routed to other providers:  lisinopril-hydrochlorothiazide (PRINZIDE, ZESTORETIC) 10-12.5 MG per tablet [name], MD]

## 2013-10-12 ENCOUNTER — Other Ambulatory Visit (INDEPENDENT_AMBULATORY_CARE_PROVIDER_SITE_OTHER): Payer: Self-pay | Admitting: General Practice

## 2013-10-13 MED ORDER — ZOLPIDEM TARTRATE 10 MG OR TABS
10.0000 mg | ORAL_TABLET | Freq: Every evening | ORAL | Status: DC | PRN
Start: 2013-10-12 — End: 2014-11-06

## 2013-10-16 ENCOUNTER — Other Ambulatory Visit (INDEPENDENT_AMBULATORY_CARE_PROVIDER_SITE_OTHER): Payer: Self-pay | Admitting: General Practice

## 2013-10-17 ENCOUNTER — Encounter (INDEPENDENT_AMBULATORY_CARE_PROVIDER_SITE_OTHER): Payer: Self-pay | Admitting: Family Medicine

## 2013-10-18 NOTE — Telephone Encounter (Signed)
Last seen patient 02/22/13.

## 2013-10-19 MED ORDER — SUMATRIPTAN SUCCINATE 100 MG OR TABS
100.0000 mg | ORAL_TABLET | Freq: Once | ORAL | Status: DC | PRN
Start: 2013-10-17 — End: 2014-01-30

## 2013-10-19 NOTE — Telephone Encounter (Signed)
 From: Sherald Dienes  To: Marquette Sites Brooksie Cap, MD  Sent: 10/16/2013 8:56 PM PST  Subject: Medication Renewal Request    Original authorizing provider: Berna Breslow, MD    Sherald Dienes would like a refill of the following medications:  sumatriptan  (IMITREX ) 100 MG tablet Berna Breslow, MD]    Preferred pharmacy: [facility] [phone] [place]     Comment:

## 2013-10-24 ENCOUNTER — Encounter (INDEPENDENT_AMBULATORY_CARE_PROVIDER_SITE_OTHER): Payer: Commercial Managed Care - HMO | Admitting: Family Medicine

## 2013-12-20 ENCOUNTER — Encounter (INDEPENDENT_AMBULATORY_CARE_PROVIDER_SITE_OTHER): Payer: Self-pay | Admitting: Family Medicine

## 2013-12-21 ENCOUNTER — Encounter (INDEPENDENT_AMBULATORY_CARE_PROVIDER_SITE_OTHER): Payer: Self-pay | Admitting: Family Medicine

## 2013-12-21 ENCOUNTER — Other Ambulatory Visit (INDEPENDENT_AMBULATORY_CARE_PROVIDER_SITE_OTHER): Payer: Self-pay | Admitting: Family Medicine

## 2013-12-21 DIAGNOSIS — I10 Essential (primary) hypertension: Principal | ICD-10-CM

## 2013-12-21 MED ORDER — LISINOPRIL-HYDROCHLOROTHIAZIDE 10-12.5 MG OR TABS
1.0000 | ORAL_TABLET | Freq: Every day | ORAL | Status: DC
Start: 2013-12-21 — End: 2014-01-30

## 2013-12-21 NOTE — Telephone Encounter (Signed)
From: Waymond Cera  To: Hettie Holstein, MD  Sent: 12/21/2013 11:53 AM PST  Subject: Medication Renewal Request    Original authorizing provider: Hettie Holstein, MD    Waymond Cera would like a refill of the following medications:  lisinopril-hydrochlorothiazide (PRINZIDE, ZESTORETIC) 10-12.5 MG tablet Hettie Holstein, MD]    Preferred pharmacy: [facility] [address]    Comment:

## 2013-12-21 NOTE — Telephone Encounter (Signed)
From: Waymond Cera  To: Hettie Holstein, MD  Sent: 12/20/2013 4:21 PM PST  Subject: 1-Non Urgent Medical Advice    I need a Hep- Titer lab test can I just have it order it for my Job at [employer] also I need a Tdap done can I just come into the clinic for that? I got my Flu last week at [facility]. I had a Hep-B test done at [facility] in 2005-2006.If that lab test could be found then all I need is a copy of it.    Regards  [place]

## 2013-12-21 NOTE — Telephone Encounter (Signed)
Message routed to Dr. Griffiths for review.

## 2013-12-21 NOTE — Telephone Encounter (Signed)
Last seen patient 02/22/13.

## 2014-01-29 ENCOUNTER — Other Ambulatory Visit (INDEPENDENT_AMBULATORY_CARE_PROVIDER_SITE_OTHER): Payer: Self-pay | Admitting: Family Medicine

## 2014-01-29 DIAGNOSIS — I10 Essential (primary) hypertension: Secondary | ICD-10-CM

## 2014-01-29 DIAGNOSIS — G43909 Migraine, unspecified, not intractable, without status migrainosus: Principal | ICD-10-CM

## 2014-01-30 MED ORDER — SUMATRIPTAN SUCCINATE 100 MG OR TABS
100.0000 mg | ORAL_TABLET | Freq: Once | ORAL | Status: DC | PRN
Start: 2014-01-30 — End: 2017-01-07

## 2014-01-30 MED ORDER — LISINOPRIL-HYDROCHLOROTHIAZIDE 10-12.5 MG OR TABS
1.0000 | ORAL_TABLET | Freq: Every day | ORAL | Status: DC
Start: 2014-01-30 — End: 2014-10-23

## 2014-01-30 NOTE — Telephone Encounter (Signed)
From: Waymond Cera  To: Hettie Holstein, MD  Sent: 01/29/2014 12:20 PM PST  Subject: Medication Renewal Request    Original authorizing provider: Hettie Holstein, MD    Waymond Cera would like a refill of the following medications:  sumatriptan (IMITREX) 100 MG tablet Hettie Holstein, MD]  lisinopril-hydrochlorothiazide (PRINZIDE, ZESTORETIC) 10-12.5 MG tablet Hettie Holstein, MD]    Preferred pharmacy: [facility] [address]    Comment:

## 2014-01-30 NOTE — Telephone Encounter (Signed)
Last seen patient 02/22/13.

## 2014-05-19 ENCOUNTER — Telehealth (INDEPENDENT_AMBULATORY_CARE_PROVIDER_SITE_OTHER): Payer: Self-pay | Admitting: Family Medicine

## 2014-05-19 NOTE — Telephone Encounter (Signed)
Via MyChart message patient requesting an appointment to be seen regarding these symptoms...    Have a lump in my throat but not a sore throat and I feeling and flu like symptoms for about 4 days. But its not the flu.

## 2014-05-22 NOTE — Telephone Encounter (Signed)
RN called patient in which there was no answer at this time.  Left a message for patient to call the clinic back.  Will follow up.

## 2014-05-25 NOTE — Telephone Encounter (Signed)
RN called & spoke with patient and he said that he no longer has any symptoms and "feels better."  Pt had no further questions or concerns and did not want to make a future appt for a check up & to discuss the new medication he wants to start, Gabapentin, in which he stated his insurance just approved.  Pt said he would call back when he wanted to schedule an appt.

## 2014-07-10 ENCOUNTER — Other Ambulatory Visit (INDEPENDENT_AMBULATORY_CARE_PROVIDER_SITE_OTHER): Payer: Commercial Managed Care - HMO | Admitting: Family

## 2014-07-10 ENCOUNTER — Other Ambulatory Visit: Payer: Commercial Managed Care - HMO | Attending: Orthopedics

## 2014-07-10 DIAGNOSIS — Z0000 Encounter for general adult medical examination without abnormal findings: Principal | ICD-10-CM

## 2014-07-11 ENCOUNTER — Other Ambulatory Visit (INDEPENDENT_AMBULATORY_CARE_PROVIDER_SITE_OTHER): Payer: Self-pay | Admitting: General Practice

## 2014-07-11 DIAGNOSIS — G4700 Insomnia, unspecified: Principal | ICD-10-CM

## 2014-07-12 LAB — QUANTIFERON-TB, BLOOD
Quantiferon TB: NEGATIVE
TB Antigen - Nil: 0.002 IU/mL

## 2014-07-12 NOTE — Telephone Encounter (Signed)
From: Brett Palmer  To: [name], MD  Sent: 07/11/2014 5:43 PM PDT  Subject: Medication Renewal Request    Original authorizing provider: Jeannetta Nap, MD    Brett Palmer would like a refill of the following medications:  zolpidem (AMBIEN) 10 MG tablet [[name], MD]    Preferred pharmacy: [facility]    Comment:

## 2014-07-12 NOTE — Telephone Encounter (Signed)
Pt has not been seen in over a year. Needs to be seen in Clinic for refill of controled medication. Pt notified via [place].

## 2014-08-01 ENCOUNTER — Encounter (INDEPENDENT_AMBULATORY_CARE_PROVIDER_SITE_OTHER): Payer: Commercial Managed Care - HMO | Admitting: Sports Medicine

## 2014-08-16 ENCOUNTER — Telehealth (INDEPENDENT_AMBULATORY_CARE_PROVIDER_SITE_OTHER): Payer: Self-pay | Admitting: Family Medicine

## 2014-08-16 NOTE — Telephone Encounter (Signed)
This RN called # [phone] spoke to Brett Palmer/patient    C/O:  Increased episode of anxiety/PTSD causing tachycardia/SOB - patient reports taking his HR range 119-149 with SOB episode desat to 92% x 3 days  Patient reports his last f/u visit at [facility] was 01/2013 and last visit with Dr. Melrose Nakayama was 01/2013 here at [place]    Denies:  SOB, CP, pain/discomfort during conversation with RN    Treatment Tried and Failed: none    Scheduled on 08/18/14 Friday @ 1600 with Dr. Jerrell Mylar (Red Team PCR/[place])   Routed to Dr. Kathee Polite

## 2014-08-16 NOTE — Telephone Encounter (Signed)
Patient called regarding symptom below. Attempt to contact RN unsuccessfully. Please advise.    Route encounter to triage RN due to symptom.    Chief complaint: f/u sob, heart reate 107/min, SBO2 is 93,  TACHYCARDIA x 3 days. Per patient stated when walking upstairs to home he gest shortness of breath and have to pause before continuing.  Onset: 3 days ago  Duration: on-goiing  Treatments: none  Fever: N  Chest Pain: N  Shortness of Breath: yes  Additional info:

## 2014-08-18 ENCOUNTER — Encounter (INDEPENDENT_AMBULATORY_CARE_PROVIDER_SITE_OTHER): Payer: Commercial Managed Care - HMO | Admitting: Family Medicine

## 2014-08-29 ENCOUNTER — Encounter (INDEPENDENT_AMBULATORY_CARE_PROVIDER_SITE_OTHER): Payer: Commercial Managed Care - HMO | Admitting: Family Medicine

## 2014-10-19 ENCOUNTER — Emergency Department
Admission: EM | Admit: 2014-10-19 | Discharge: 2014-10-19 | Disposition: A | Discharge status: Home / Self Care | Payer: Commercial Managed Care - HMO | Attending: Emergency Medicine | Admitting: Emergency Medicine

## 2014-10-19 ENCOUNTER — Encounter (HOSPITAL_COMMUNITY): Payer: Self-pay | Admitting: Emergency Medicine

## 2014-10-19 DIAGNOSIS — R0602 Shortness of breath: Secondary | ICD-10-CM | POA: Insufficient documentation

## 2014-10-19 DIAGNOSIS — K219 Gastro-esophageal reflux disease without esophagitis: Secondary | ICD-10-CM | POA: Insufficient documentation

## 2014-10-19 DIAGNOSIS — R079 Chest pain, unspecified: Secondary | ICD-10-CM | POA: Insufficient documentation

## 2014-10-19 DIAGNOSIS — R945 Abnormal results of liver function studies: Secondary | ICD-10-CM

## 2014-10-19 DIAGNOSIS — F419 Anxiety disorder, unspecified: Secondary | ICD-10-CM | POA: Insufficient documentation

## 2014-10-19 DIAGNOSIS — I471 Supraventricular tachycardia: Secondary | ICD-10-CM | POA: Insufficient documentation

## 2014-10-19 DIAGNOSIS — R7989 Other specified abnormal findings of blood chemistry: Secondary | ICD-10-CM

## 2014-10-19 DIAGNOSIS — R002 Palpitations: Secondary | ICD-10-CM | POA: Insufficient documentation

## 2014-10-19 DIAGNOSIS — F4310 Post-traumatic stress disorder, unspecified: Secondary | ICD-10-CM | POA: Insufficient documentation

## 2014-10-19 DIAGNOSIS — Z95810 Presence of automatic (implantable) cardiac defibrillator: Secondary | ICD-10-CM

## 2014-10-19 DIAGNOSIS — D72829 Elevated white blood cell count, unspecified: Secondary | ICD-10-CM | POA: Insufficient documentation

## 2014-10-19 LAB — CBC WITH DIFF, BLOOD
ANC-Automated: 6 10*3/uL (ref 1.6–7.0)
Abs Eosinophils: 0.2 10*3/uL (ref 0.1–0.7)
Abs Lymphs: 3.2 10*3/uL — ABNORMAL HIGH (ref 0.8–3.1)
Abs Monos: 0.8 10*3/uL (ref 0.2–0.8)
Eosinophils: 2 % (ref 1–4)
Hct: 46.7 % (ref 40.0–50.0)
Hgb: 16.3 gm/dL (ref 13.7–17.5)
Lymphocytes: 31 % (ref 19–53)
MCH: 32.1 pg — ABNORMAL HIGH (ref 26.0–32.0)
MCHC: 34.9 % (ref 32.0–36.0)
MCV: 92.1 um3 (ref 79.0–95.0)
MPV: 9.9 fL (ref 9.4–12.4)
Monocytes: 8 % (ref 5–12)
Plt Count: 193 10*3/uL (ref 140–370)
RBC: 5.07 10*6/uL (ref 4.60–6.10)
RDW: 13.6 % (ref 12.0–14.0)
Segs: 58 % (ref 34–71)
WBC: 10.3 10*3/uL — ABNORMAL HIGH (ref 4.0–10.0)

## 2014-10-19 LAB — COMPREHENSIVE METABOLIC PANEL, BLOOD
ALT (SGPT): 367 U/L — ABNORMAL HIGH (ref 0–41)
AST (SGOT): 388 U/L — ABNORMAL HIGH (ref 0–40)
Albumin: 4.8 g/dL (ref 3.5–5.2)
Alkaline Phos: 110 U/L (ref 40–129)
Anion Gap: 17 mmol/L — ABNORMAL HIGH (ref 7–15)
BUN: 13 mg/dL (ref 6–20)
Bicarbonate: 24 mmol/L (ref 22–29)
Bilirubin, Tot: 0.71 mg/dL (ref <1.20)
Calcium: 10.5 mg/dL (ref 8.5–10.6)
Chloride: 98 mmol/L (ref 98–107)
Creatinine: 0.86 mg/dL (ref 0.67–1.17)
GFR: >60 mL/min
Glucose: 121 mg/dL — ABNORMAL HIGH (ref 70–115)
Potassium: 4 mmol/L (ref 3.5–5.1)
Sodium: 139 mmol/L (ref 136–145)
Total Protein: 8.4 g/dL — ABNORMAL HIGH (ref 6.0–8.0)

## 2014-10-19 LAB — TROPONIN T, BLOOD
Troponin T: <0.01 ng/mL (ref <0.01)
Troponin T: <0.01 ng/mL (ref <0.01)

## 2014-10-19 LAB — CPK-CREATINE PHOSPHOKINASE, BLOOD: CPK: 132 U/L (ref 0–175)

## 2014-10-19 LAB — CKMB+INDEX (NO CPK), BLOOD
CK-MB Index: 1.1 %
CK-MB: 1.4 ng/mL (ref 0.0–4.8)

## 2014-10-19 MED ORDER — METOPROLOL TARTRATE 25 MG OR TABS
25.0000 mg | ORAL_TABLET | Freq: Once | ORAL | Status: AC
Start: 2014-10-19 — End: 2014-10-19
  Administered 2014-10-19: 25 mg via ORAL
  Filled 2014-10-19: qty 1

## 2014-10-19 MED ORDER — SODIUM CHLORIDE 0.9 % IV SOLN
Freq: Once | INTRAVENOUS | Status: AC
Start: 2014-10-19 — End: 2014-10-19
  Administered 2014-10-19: 19:00:00 via INTRAVENOUS

## 2014-10-19 MED ORDER — ADENOSINE 6 MG/2ML IV SOLN
INTRAVENOUS | Status: DC
Start: 2014-10-19 — End: 2014-10-20
  Filled 2014-10-19: qty 4

## 2014-10-19 MED ORDER — METOPROLOL TARTRATE 25 MG OR TABS
25.0000 mg | ORAL_TABLET | Freq: Two times a day (BID) | ORAL | Status: DC
Start: 2014-10-19 — End: 2014-11-22

## 2014-10-19 MED ORDER — ADENOSINE 6 MG/2ML IV SOLN
12.0000 mg | Freq: Once | INTRAVENOUS | Status: AC
Start: 2014-10-19 — End: 2014-10-19
  Administered 2014-10-19: 12 mg via INTRAVENOUS

## 2014-10-19 NOTE — ED Notes (Signed)
Blood samples collected/labeled and sent to the lab.

## 2014-10-19 NOTE — ED Notes (Signed)
Pt alert no chest pain. No sob. Co "feeling tired". Pt has had previous episodes since 2011. No current cardiology follow-up.

## 2014-10-19 NOTE — ED Notes (Signed)
pt states chest pain/cp since about 1730, states high hrt rate, HR 200's in triage, took his own hr at home found to be in 200's, after adenosine, ST 110-120 on monitor/audible, skin warm, wet, normal for color/ethnicity, breathing non-lab, even, spo2 98% on ra, BP high, MD was at beside, and aware

## 2014-10-19 NOTE — ED EKG Interpretation (Signed)
ED EKG Interpretation    EKG: Supraventricular Tachycardia with Left Axis deviation and nonspecific ST and T wave changes   Nl intervals poor rwp.

## 2014-10-19 NOTE — ED Notes (Addendum)
Pt on full cardiac monitor and zoll, report a little sob/cp, placed on 6lnc per md, SVT 200 on monitor/audible, MD Stephanie Coup at bedside, 12 mg adenosine pushed, pt went into ST 120's

## 2014-10-19 NOTE — ED Notes (Signed)
SB[facility] report received from Jemmy and Elana. Pt care assumed.

## 2014-10-19 NOTE — ED Provider Notes (Signed)
Emergency Dept Note    Chief Complaint:   Chief Complaint   Patient presents with    Irregular Heart Beat     pt states chest pain/cp since about 1730, states high hrt rate, HR 200's in triage       HPI:  Brett Palmer is a 48 year old  male with past medical history of PTSD and GERD who presents today with sudden onset of shortness of breath and palpitations at 5:30 this afternoon. The patient says he is a mild amount of midsternal chest pain. He has no fevers, chills, nausea vomiting and diarrhea. He has had some mild stomach upset for the 3 days prior. He denies any urinary symptoms or any headache. When he started feeling the symptoms he put himself on some moderate heart rate to be over 200. He then had his wife tried to the emergency department.       What To Do With Your Medications      CONTINUE taking these medications       Add'l Info    ALPRAZolam 1 MG tablet   Commonly known as:  XANAX   Take 1 tablet by mouth 3 times daily as needed for Anxiety.    Quantity:  30 tablet   Refills:  0       hydrocortisone 2.5 % cream   Apply to affected area twice daily as needed for itching.    Quantity:  60 g   Refills:  0       ketoconazole 2 % cream   Commonly known as:  NIZORAL   Apply a thin layer as directed twice daily    Quantity:  60 Tube   Refills:  0       lisinopril-hydrochlorothiazide 10-12.5 MG tablet   Commonly known as:  PRINZIDE, ZESTORETIC   Take 1 tablet by mouth daily.    Quantity:  30 tablet   Refills:  6       prazosin 1 MG capsule   Commonly known as:  MINIPRESS   Take 4 capsules by mouth nightly.    Quantity:  30 capsule   Refills:  0       PRILOSEC 20 MG capsule   Take 20 mg by mouth daily.   Generic drug:  omeprazole    Refills:  0       sumatriptan 100 MG tablet   Commonly known as:  IMITREX   Take 1 tablet by mouth once as needed for Migraine. May repeat in 2 hours in needed    Quantity:  10 tablet   Refills:  5       zolpidem 10 MG tablet   Commonly known as:  AMBIEN   Take 1 tablet by mouth  nightly as needed for Insomnia.    Quantity:  30 tablet   Refills:  1             Allergies: Compazine and Latex    Past Medical and Surgical History:   Past Medical History   Diagnosis Date    Gastroesophageal reflux disease     Migraine headache      part of [place] war syndrome    PTSD (post-traumatic stress disorder) 1995-present     treated by Dr. Joen Laura at [facility]      Past Surgical History   Procedure Laterality Date    No past surgical history         Family History:   Family History   Problem Relation  Age of Onset    Cancer Father      melanoma, died at age 69       Social History:   Works as a Marine scientist.      ROS:  As per HPI, unless noted below   Review of systems:   Gen (-) fever or chills  Neuro (-) headache, (-) arm/leg weakness, (-) difficulty speaking/walking   ENT (-) sore throat or nasal discharge   Eyes (-) blurry vision or floaters   Resp (-) cough or pleuritic pain   GI (-) abd pain, (-) vomiting   CV (+) chest pain or trauma  GU (-) dysuria or hematura  Musculoskeletal (-) neck pain, (-) back pain   Skin (-) rashes or blisters        Physical exam  Vital signs reviewed and noted -   4    10/19/14  1815 10/19/14  1835 10/19/14  1838 10/19/14  1845   BP: 166/107 164/102  146/99   Pulse: 205 113  116   Temp:   97.6 F (36.4 C)    Resp: 19 18 16 21    SpO2: 95% 97%  95%        Gen: Patient is in mild distress, some flushing  HEENT: NC/AT, PERRL. No icterus, ptosis. Normal oropharynx w/out exudates, erythema. moist mucous membranes.  Neck: Supple, no JVD, no LAD.  Lungs: Normal breath sounds. No wheeze/rales/rhonchi   CV: regular and tachy. Normal heart sounds. No murmurs appreciated  Abdomen: Normal bowel sounds. NTND. No masses, organomegaly.  Back: No CVA tenderness.  Extremities: No cyanosis, edema.   Neurologic: Mentation appropriate.  CN II-XII grossly normal.    LABS  Results for orders placed or performed during the hospital encounter of 10/19/14   COMPREHENSIVE METABOLIC PANEL, BLOOD   Result  Value Ref Range    Glucose 121 (H) 70 - 115 mg/dL    BUN 13 6 - 20 mg/dL    Creatinine 0.86 0.67 - 1.17 mg/dL    GFR >60 mL/min    Sodium 139 136 - 145 mmol/L    Potassium 4.0 3.5 - 5.1 mmol/L    Chloride 98 98 - 107 mmol/L    Bicarbonate 24 22 - 29 mmol/L    Anion Gap 17 (H) 7 - 15 mmol/L    Calcium 10.5 8.5 - 10.6 mg/dL    Total Protein 8.4 (H) 6.0 - 8.0 g/dL    Albumin 4.8 3.5 - 5.2 g/dL    Bilirubin, Tot 0.71 <1.20 mg/dL    AST (SGOT) 388 (H) 0 - 40 U/L    ALT (SGPT) 367 (H) 0 - 41 U/L    Alkaline Phos 110 40 - 129 U/L   CBC WITH ADIFF, BLOOD   Result Value Ref Range    WBC 10.3 (H) 4.0 - 10.0 1000/mm3    RBC 5.07 4.60 - 6.10 mill/mm3    Hgb 16.3 13.7 - 17.5 gm/dL    Hct 46.7 40.0 - 50.0 %    MCV 92.1 79.0 - 95.0 um3    MCH 32.1 (H) 26.0 - 32.0 pgm    MCHC 34.9 32.0 - 36.0 %    RDW 13.6 12.0 - 14.0 %    MPV 9.9 9.4 - 12.4 fL    Plt Count 193 140 - 370 1000/mm3    Segs 58 34 - 71 %    Lymphocytes 31 19 - 53 %    Monocytes 8 5 - 12 %  Eosinophils 2 <1-4 %    ANC-Automated 6.0 1.6 - 7.0 1000/mm3    Abs Lymphs 3.2 (H) 0.8 - 3.1 1000/mm3    Abs Monos 0.8 0.2 - 0.8 1000/mm3    Abs Eosinophils 0.2 <0.1-0.7 1000/mm3    Diff Type Automated    CPK-CREATINE PHOSPHOKINASE, BLOOD   Result Value Ref Range    CPK 132 0 - 175 U/L   CKMB+INDEX (NO CPK) PANEL, BLOOD   Result Value Ref Range    CK-MB 1.4 0.0 - 4.8 ng/mL    CK-MB Index 1.1 %   TROPONIN T, BLOOD   Result Value Ref Range    Troponin T <0.01 <0.01 ng/mL       DIAGNOSTIC STUDIES  EKG interpretation: SVT in 200s, no st changes,  No ectopy        Assessment and Clinical Decision-making  48 yo male with hx of svt now with episode of sustain svt.  EKG confirmed and treated with fluids and adenosine.  LFT elevations but nml trop and mild leukocytosis.  Will monitor and cards f/u.        Patient discussed with attending, Erle Crocker, *, before final recommendations are made.       Royce Macadamia Labron Bloodgood  7:18 PM        Joycelyn Rua, MD  Resident  10/19/14  1950    Erle Crocker, MD  10/19/14 [phone]

## 2014-10-19 NOTE — ED Notes (Signed)
Pt feels well. Up to bathroom. Ambulating well independently

## 2014-10-19 NOTE — Discharge Instructions (Signed)
Abnormal Liver Function Tests    You had abnormal liver tests.    Your liver is an important organ in your body. The liver metabolizes (breaks down) drugs and reduces toxins in your blood stream. The liver also makes clotting factors to stop bleeding and helps with the storage of fat and vitamins. Several blood tests can be done to see how well your liver is working. Common tests include AST (aspartate aminotransferase) and ALT (alanine aminotransferase). These are enzymes (important proteins in the blood) that are found in your liver. If there is injury, these proteins and others leak into your blood stream and can be measured.    Your doctor found a small abnormality in your liver tests. Some reasons for this are:   Acetaminophen (Tylenol) overdose or too much use.   Abusing alcohol or using it too much.   Chronic hepatitis.   Fatty liver because of diabetes or obesity.   Some medications like anti-seizure medications, antibiotics or cholesterol medications.    Your doctor does not feel that anything needs to be done about your elevated liver functions right away. You might need another exam or more tests to find out why you have this abnormal lab result. At this time, the cause of your laboratory results does not seem dangerous. You do not need to stay in the hospital.    In some cases, an abnormal liver function result can be a sign of something serious or an illness. This is why it s important for your primary doctor to keep a close eye on you. The doctor will need to recheck your abnormal laboratory result.    You will need to follow up with your doctor to have your liver tests rechecked. You will need to have more tests done to figure out why your liver tests are abnormal. You should also let your primary care doctor know about your abnormal test results right away.    If medication is prescribed for you, please take it as instructed.     YOU SHOULD SEEK MEDICAL ATTENTION IMMEDIATELY, EITHER  HERE OR AT THE NEAREST EMERGENCY DEPARTMENT, IF ANY OF THE FOLLOWING OCCUR:     You can t get your liver function tests rechecked in the time period your doctor recommended today.   You notice your skin or eyes turning yellow (jaundice).   You have easy bruising or bleeding.   You have severe abdominal (belly) pain.   You have chest pain or shortness of breath.    If you can t follow up with your doctor, or if at any time you feel you need to be rechecked or seen again, come back here or go to the nearest emergency department.              Cardiac Dysrhythmia    You have been seen for a cardiac (heart) dysrhythmia.    Any change from the heart s normal rhythm or rate (speed of beating) is called a cardiac dysrhythmia, or arrhythmia. This may happen because the heart s electrical system is not working normally. Some dysrhythmias are harmless and occur in healthy hearts. However, others may cause a stroke or sudden cardiac arrest (stopping of the heart).    The heart has an electrical system that gets the heart muscle to contract and pump blood to the body. This system is the heart s natural pacemaker. It keeps the heart pumping by following a strict pathway. Any abnormalities in the route can cause an irregular heartbeat.  Types of dysrhythmia can include:     Bradycardia: This is when the heart beats too slowly--less than 60 beats per minute in an adult. People who are athletes or in very good shape are an exception. Their hearts work very efficiently, so their heart rates can be in the 65s or 45s and still be normal.    Bradycardia can limit the amount of blood flow to the body and brain. This can cause dizziness, fatigue (feeling tired) or fainting. This can happen because of a problem in the heart s electrical system, damage from heart disease, or an underlying condition like hypothermia (dangerously low body temperature). Sometimes this needs treatment when there are symptoms like dizziness,  fatigue or fainting. If you had this condition, you may have been treated with transcutaneous pacing. This is when electrodes are put on the outside of your chest to get your heart to beat. You may have gotten a pacemaker to permanently stimulate your heart to beat more times per minute so that your body gets the blood and oxygen it needs.     Tachycardia: This is when the heart beats too fast--a rate of more than 100 beats per minute. A racing heart, palpitations (abnormal heartbeats), dizziness or feeling lightheaded are all possible symptoms. Tachycardia usually happens when the heart s electrical system isn t working properly. Sometimes tachycardia is caused by dehydration (not having enough water) or fever and will go away once the fever goes away or you have enough fluid in your system. Other times, there is a more serious cause for the fast heart rate that needs additional treatments or medications.     Fibrillation: With this condition, the heart just quivers. It does not forcefully pump blood through the body. If this happens in the upper chambers of the heart, the condition is called atrial fibrillation, or A-Fib. If there is fibrillation in the ventricles, or lower chambers of the heart, the condition causes sudden death unless it can be reversed immediately. A person in ventricular fibrillation needs immediate emergency medical attention. If this happened to you, you probably needed CPR and electrical shocking to your heart to make it beat properly again.     Atrial Flutter: This is a condition where the top of the heart beats too fast and cannot squeeze blood to the rest of the heart in a regular way. When you have atrial flutter, your heartbeats may be noticeable (palpitations). You may also feel tired (fatigue) or be short of breath. It may seem like your heart is beating very fast. The heart often beats at around 150 beats per minute.     Premature Contractions: Sometimes the heart beats an  extra time, or sooner than usual. This is a called a premature contraction. This happens when your heart seems to skip a beat or it feels like it flip-flops in the chest. Most people have these at one time or another. They are very common in teenagers and small children. Usually these do not need any treatment, even though they can be scary or cause a strange feeling.     EKGs help diagnose these dysrhythmias. You probably had additional blood tests and possibly X-rays to help make a final diagnosis of your dysrhythmia.    Your doctor may have prescribed medications to help you to manage your hearts rhythm. Follow the instructions for taking these medications.     Though we dont believe your condition is dangerous right now, it is important to be careful. Sometimes  a problem that seems mild can become serious later. This is why it is very important that you return here or go to the nearest Emergency Department if you are not improving or your symptoms are getting worse.    Dysrhythmias can be controlled if you follow these suggestions closely. Follow these instructions:     Take all medications as prescribed; do not skip doses. It s always a good idea to take all of your medication bottles with you when you see your doctor or visit the Emergency Department.     Exercise. Exercise is recommended, but start slowly. Walking is great exercise. Don t do vigorous (where your heart rate is high) or extreme exercise. You can do normal everyday activities as your body allows. Take rest breaks if you feel tired. Do not overdo it. Stop activity if you have pain, get short of breath or feel dizzy.     Stop smoking! Smoking is dangerous to your health! Smoking leads to chronic obstructive pulmonary disease (Emphysema), and increases the risk of heart disease, cancer, and stroke. Many medications and aids can help you quit smoking. Ask your doctor for information.     Follow-up. Dysrhythmia is a chronic (ongoing)  medical problem. You need CLOSE follow-up with your doctor to keep an eye on it. Follow all instructions and take your medications to help prevent hospitalizations and Emergency Department visits and make your quality of life better. Remember to take all your medications with you when you see your doctor.    Dysrhythmia can be a serious disease. Follow up with your doctor or your cardiologist as you were instructed.    YOU SHOULD SEEK MEDICAL ATTENTION IMMEDIATELY, EITHER HERE OR AT THE NEAREST EMERGENCY DEPARTMENT, IF ANY OF THE FOLLOWING OCCUR:   You have new or more swelling in your feet or legs.   You have to add more pillows when you sleep because you are short of breath.   You feel worse at any time, or develop any new symptoms or concerns.   Your blood pressure is higher or lower than your caregiver recommends.   You have severe shortness of breath. This can include the need to sleep sitting up.   You feel weak or lightheaded.   You notice more palpitations (extra heart beats) or strange heart beats than usual.   Your heart starts beating very fast.   You start having symptoms of a heart attack: Any chest pain or chest discomfort, or pain/discomfort in one or both arms, back, neck, jaw or stomach; shortness of breath; breaking out in cold sweat; feeling sick (nausea) or light-headed. Call 911 immediately if you have any combination of these symptoms!    If you can t follow up with your doctor, or if at any time you feel you need to be rechecked or seen again, come back here or go to the nearest emergency department.

## 2014-10-19 NOTE — ED Notes (Signed)
Pt to be discharged. Waiting for paperwork to be completed by MD. Pt getting dressed.

## 2014-10-19 NOTE — ED Notes (Signed)
Resident at bedside to evaluate pt.

## 2014-10-20 ENCOUNTER — Other Ambulatory Visit (INDEPENDENT_AMBULATORY_CARE_PROVIDER_SITE_OTHER): Payer: Self-pay | Admitting: Family Medicine

## 2014-10-20 ENCOUNTER — Other Ambulatory Visit (INDEPENDENT_AMBULATORY_CARE_PROVIDER_SITE_OTHER): Payer: Self-pay | Admitting: General Practice

## 2014-10-20 DIAGNOSIS — G4700 Insomnia, unspecified: Secondary | ICD-10-CM

## 2014-10-20 DIAGNOSIS — I10 Essential (primary) hypertension: Principal | ICD-10-CM

## 2014-10-20 NOTE — ED Follow-up Note (Signed)
Follow-up type: Callback       Routine ED Patient Call Back    Patient contacted by telephone appreciated phone call and would like [place] to pass on to staff excellent care. No further palpitations and plans fu as discussed

## 2014-10-23 NOTE — Telephone Encounter (Signed)
Last office visit 02/22/13. Seen with Dr. Melrose Nakayama. Sent mychart message to patient to call clinic to schedule an appointment.

## 2014-10-23 NOTE — Telephone Encounter (Signed)
From: Brett Palmer  Sent: 10/20/2014 5:16 PM PST  Subject: Medication Renewal Request    Original authorizing provider: Hettie Holstein, MD    Brett Palmer would like a refill of the following medications:  lisinopril-hydrochlorothiazide (PRINZIDE, ZESTORETIC) 10-12.5 MG tablet Hettie Holstein, MD]    Preferred pharmacy: [place]    Comment:      Medication renewals requested in this message routed to other providers:  zolpidem (AMBIEN) 10 MG tablet [[name], MD]

## 2014-10-24 MED ORDER — LISINOPRIL-HYDROCHLOROTHIAZIDE 10-12.5 MG OR TABS
1.0000 | ORAL_TABLET | Freq: Every day | ORAL | Status: DC
Start: 2014-10-24 — End: 2014-11-06

## 2014-10-24 NOTE — Telephone Encounter (Signed)
Rx authorized. Pt has appt scheduled with me for 12/7.

## 2014-10-24 NOTE — Telephone Encounter (Signed)
From: Brett Palmer  To: [name], MD  Sent: 10/20/2014 5:16 PM PST  Subject: Medication Renewal Request    Original authorizing provider: Jeannetta Nap, MD    Brett Palmer would like a refill of the following medications:  zolpidem (AMBIEN) 10 MG tablet [[name], MD]    Preferred pharmacy: [place]    Comment:      Medication renewals requested in this message routed to other providers:  lisinopril-hydrochlorothiazide (PRINZIDE, ZESTORETIC) 10-12.5 MG tablet Hettie Holstein, MD]

## 2014-10-25 NOTE — Telephone Encounter (Signed)
Pt has not been seen in this clinic since 02/22/2013 & is askin g for refills on his ambien &li

## 2014-10-25 NOTE — Telephone Encounter (Signed)
This is a Brett Palmer pt, routed to [facility].

## 2014-10-29 LAB — ECG 12-LEAD
ATRIAL RATE: 118 {beats}/min
P AXIS: 37 degrees
PR INTERVAL: 156 ms
QRS INTERVAL/DURATION: 92 ms
QT: 322 ms
QTC INTERVAL: 451 ms
R AXIS: 131 degrees
T AXIS: 7 degrees
VENTRICULAR RATE: 118 {beats}/min

## 2014-11-06 ENCOUNTER — Ambulatory Visit (INDEPENDENT_AMBULATORY_CARE_PROVIDER_SITE_OTHER): Payer: Commercial Managed Care - HMO | Admitting: Family Medicine

## 2014-11-06 ENCOUNTER — Telehealth (INDEPENDENT_AMBULATORY_CARE_PROVIDER_SITE_OTHER): Payer: Self-pay | Admitting: Family Medicine

## 2014-11-06 ENCOUNTER — Encounter (INDEPENDENT_AMBULATORY_CARE_PROVIDER_SITE_OTHER): Payer: Self-pay | Admitting: Family Medicine

## 2014-11-06 VITALS — BP 135/78 | HR 79 | Temp 98.9°F | Ht 72.0 in | Wt 262.0 lb

## 2014-11-06 DIAGNOSIS — I471 Supraventricular tachycardia: Principal | ICD-10-CM

## 2014-11-06 DIAGNOSIS — J309 Allergic rhinitis, unspecified: Secondary | ICD-10-CM

## 2014-11-06 DIAGNOSIS — R234 Changes in skin texture: Secondary | ICD-10-CM

## 2014-11-06 DIAGNOSIS — G4700 Insomnia, unspecified: Secondary | ICD-10-CM

## 2014-11-06 DIAGNOSIS — I10 Essential (primary) hypertension: Secondary | ICD-10-CM

## 2014-11-06 DIAGNOSIS — F411 Generalized anxiety disorder: Secondary | ICD-10-CM

## 2014-11-06 DIAGNOSIS — R7401 Elevation of levels of liver transaminase levels: Secondary | ICD-10-CM

## 2014-11-06 DIAGNOSIS — R740 Nonspecific elevation of levels of transaminase and lactic acid dehydrogenase [LDH]: Secondary | ICD-10-CM

## 2014-11-06 DIAGNOSIS — Z23 Encounter for immunization: Secondary | ICD-10-CM

## 2014-11-06 MED ORDER — FLUTICASONE PROPIONATE 50 MCG/ACT NA SUSP
2.0000 | Freq: Every day | NASAL | Status: DC
Start: 2014-11-06 — End: 2015-07-04

## 2014-11-06 MED ORDER — LISINOPRIL-HYDROCHLOROTHIAZIDE 10-12.5 MG OR TABS
1.0000 | ORAL_TABLET | Freq: Every day | ORAL | Status: DC
Start: 2014-11-06 — End: 2016-11-10

## 2014-11-06 MED ORDER — ZOLPIDEM TARTRATE 10 MG OR TABS
10.0000 mg | ORAL_TABLET | Freq: Every evening | ORAL | Status: DC | PRN
Start: 2014-11-06 — End: 2017-01-07

## 2014-11-06 NOTE — Interdisciplinary (Signed)
Pre-visit chart review and huddle completed with staff and physician.    Outstanding labs, imaging and consults reviewed and identified.    Health maintanence issues identified and addressed:    Health Maintenance   Topic Date Due    INFLUENZA VACCINE  07/01/2014    IMM_TD/TDAP=>48 YO  07/11/2018

## 2014-11-06 NOTE — Telephone Encounter (Signed)
Requesting verbal approval for DU:PBDHDIXB (AMBIEN) 10 MG tablet     Pt waiting at pharmacy...Marland KitchenMarland Kitchen

## 2014-11-06 NOTE — Patient Instructions (Addendum)
Do not combine alcohol with ambien or xanax.   Wean off xanax over the next few days - 3 tabs tonight, 2 tabs tomorrow night, 1 tab the next night, then can start taking Ambien.

## 2014-11-06 NOTE — Progress Notes (Signed)
[facility] Clinic Progress Note    CC: f/u ED visit    Subjective: Brett Palmer is a 48 year old male with history of SVT who is here for ED f/u.    #SVT: Pt was recently in ED with HR up to 207. Found to be in SVT. Cardiac enzymes normal. Referred to arrhythmia clinic, appt on 12/10. Notes periodic dyspnea on exertion, but improved since starting the metoprolol. Had stress echo in 2011 that was normal.    #PTSD: Has been in counseling for PTSD since experience in [place]. Psychiatrist has trialed him on Zoloft, Paxil, Abilify, SNRIs, has not had good responses. 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 It has been stressful. Thinks he has been more anxious lately. Exercise used to be a healthy outlet for stress but has not been able to do so recently with heart condition. Has been trying to cut down EtOH, down to 2 glasses of wine daily. Previously 1 bottle of wine daily.     #Insomnia: Takes Xanax 4mg  QHS to help sleep. Knows he needs to wean off, has tried to go to 3mg  but has been difficult. Trazodone, hydroxyzine make him too groggy. Takes minipress for night sweats, night terrors, which helps.      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 Takes pregabalin 50mg  QHS, which helps somewhat.    #Scab: Has noted a scab in front of his right ear that has not healed for the past couple weeks.    Additional ROS: Nasal congestion and rhinorrhea      Patient Active Problem List   Diagnosis    Hypertensive disorder    Anxiety    Palpitations    ETOH abuse    PTSD (post-traumatic stress disorder)     Outpatient Prescriptions Prior to Visit   Medication Sig Dispense Refill    ALPRAZolam (XANAX) 1 MG tablet Take 1 tablet by mouth 3 times daily as needed for Anxiety. 30 tablet 0    hydrocortisone 2.5 % cream Apply to affected area twice daily as needed for itching. 60 g 0     ketoconazole (NIZORAL) 2 % cream Apply a thin layer as directed twice daily 60 Tube 0    lisinopril (PRINIVIL, ZESTRIL) 10 MG tablet Take 1 tablet by mouth daily. 30 tablet 5    lisinopril-hydrochlorothiazide (PRINZIDE, ZESTORETIC) 10-12.5 MG tablet Take 1 tablet by mouth daily. 30 tablet 0    metoprolol tartrate (LOPRESSOR) 25 MG tablet Take 1 tablet (25 mg) by mouth 2 times daily. 60 tablet 0    omeprazole (PRILOSEC) 20 MG capsule Take 20 mg by mouth daily.      prazosin (MINIPRESS) 1 MG capsule Take 4 capsules by mouth nightly. 30 capsule 0    sumatriptan (IMITREX) 100 MG tablet Take 1 tablet by mouth once as needed for Migraine. May repeat in 2 hours in needed 10 tablet 5    zolpidem (AMBIEN) 10 MG tablet Take 1 tablet by mouth nightly as needed for Insomnia. 30 tablet 1     No facility-administered medications prior to visit.     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     Social History Main Topics    Smoking status: Former Smoker -- 0.50 packs/day for 10 years     Types: Cigarettes     Quit date: 01/11/1990    Smokeless tobacco: Not on file    Alcohol Use: 3.5 - 7.0 oz/week     7-14 drink(s) per week      Comment: wine    Drug Use: No    Sexual Activity:     Partners: Female     Other Topics Concern    Not on file     Social History Narrative         Objective:  Filed Vitals:    11/06/14 0950   BP: 135/78   Pulse: 79   Temp: 98.9 F (37.2 C)   TempSrc: Oral   Height: 6' (1.829 m)   Weight: 118.842 kg (262 lb)     Estimated body mass index is 35.53 kg/(m^2) as calculated from the following:    Height as of this encounter: 6' (1.829 m).     Weight as of this encounter: 118.842 kg (262 lb).    Physical Examination  General Appearance: healthy, alert, no distress, pleasant affect, cooperative.  Eyes:  conjunctivae and corneas clear. PERRL, EOM's intact.   Ears:  normal TMs and canal.  Nose: narrow nasal canal with large turbinates bilaterally  Neck:  Neck supple. No adenopathy, thyroid symmetric, normal size.  Throat: oropharynx with mild irritation, no exudate  Heart:  normal rate and regular rhythm, no murmurs, clicks, or gallops.  Lungs: clear to auscultation, no chest deformities noted.  Abdomen: Abdomen soft, non-tender. No masses or organomegaly. Bowel sounds normal.  Extremities:  no cyanosis, clubbing, or edema.  Skin: 5x71mm scab with irregular borders anterior to right ear    Labs:  Recent Results (from the past 672 hour(s))   COMPREHENSIVE METABOLIC PANEL, BLOOD    Collection Time: 10/19/14  6:22 PM   Result Value Ref Range    Glucose 121 (H) 70 - 115 mg/dL    BUN 13 6 - 20 mg/dL    Creatinine 0.86 0.67 - 1.17 mg/dL    GFR >60 mL/min    Sodium 139 136 - 145 mmol/L    Potassium 4.0 3.5 - 5.1 mmol/L    Chloride 98 98 - 107 mmol/L    Bicarbonate 24 22 - 29 mmol/L    Anion Gap 17 (H) 7 - 15 mmol/L    Calcium 10.5 8.5 - 10.6 mg/dL    Total Protein 8.4 (H) 6.0 - 8.0 g/dL    Albumin 4.8 3.5 - 5.2 g/dL    Bilirubin, Tot 0.71 <1.20 mg/dL    AST (SGOT) 388 (H) 0 - 40 U/L    ALT (SGPT) 367 (H) 0 - 41 U/L    Alkaline Phos 110 40 - 129 U/L   CBC WITH ADIFF, BLOOD    Collection Time: 10/19/14  6:22 PM   Result Value Ref Range    WBC 10.3 (H) 4.0 - 10.0 1000/mm3    RBC 5.07 4.60 - 6.10 mill/mm3    Hgb 16.3 13.7 - 17.5 gm/dL    Hct 46.7 40.0 - 50.0 %    MCV 92.1  79.0 - 95.0 um3    MCH 32.1 (H) 26.0 - 32.0 pgm    MCHC 34.9 32.0 - 36.0 %    RDW 13.6 12.0 - 14.0 %    MPV 9.9 9.4 - 12.4 fL    Plt Count 193 140 - 370 1000/mm3    Segs 58 34 - 71 %    Lymphocytes 31 19 - 53 %    Monocytes 8 5 - 12 %    Eosinophils 2 <1-4 %    ANC-Automated 6.0 1.6 - 7.0 1000/mm3     Abs Lymphs 3.2 (H) 0.8 - 3.1 1000/mm3    Abs Monos 0.8 0.2 - 0.8 1000/mm3    Abs Eosinophils 0.2 <0.1-0.7 1000/mm3    Diff Type Automated    CPK-CREATINE PHOSPHOKINASE, BLOOD    Collection Time: 10/19/14  6:22 PM   Result Value Ref Range    CPK 132 0 - 175 U/L   CKMB+INDEX (NO CPK) PANEL, BLOOD    Collection Time: 10/19/14  6:22 PM   Result Value Ref Range    CK-MB 1.4 0.0 - 4.8 ng/mL    CK-MB Index 1.1 %   TROPONIN T, BLOOD    Collection Time: 10/19/14  6:22 PM   Result Value Ref Range    Troponin T <0.01 <0.01 ng/mL   ECG 12-LEAD    Collection Time: 10/19/14  6:28 PM   Result Value Ref Range    VENTRICULAR RATE 118 BPM    ATRIAL RATE 118 BPM    PR INTERVAL 156 ms    QRS INTERVAL/DURATION 92 ms    QT 322 ms    QTC INTERVAL 451 ms    P AXIS 37 degrees    R AXIS 131 degrees    T AXIS 7 degrees    ECG INTERPRETATION       Sinus tachycardia  Possible Left atrial enlargement  Left posterior fascicular block  T wave abnormality, consider inferior ischemia  Abnormal ECG    Confirmed by Above generated by computer only, Results in ED notes (204),   editor Lucienne Minks ([id number]) on 10/29/2014 7:54:00  AM     TROPONIN T, BLOOD    Collection Time: 10/19/14  9:03 PM   Result Value Ref Range    Troponin T <0.01 <0.01 ng/mL     [place] PHQ9 DEPRESSION QUESTIONNAIRE 11/17/2011 11/16/2012 11/29/2012 11/29/2012 02/22/2013 11/06/2014   Interest 1 1 1 3 1 1    Depressed 0 1 1 2 1 1    Sleep -- 1 -- 3 3 0   Energy -- 1 -- 3 3 1    Appetite -- 1 -- 3 3 1    Failure -- 0 -- 3 0 1   Concentration -- 1 -- 3 3 0   Movement -- 0 -- 2 0 0   Suicide -- 0 -- 0 0 0   Summary(Manual) -- 6 -- -- -- --   Summary(Calculated) -- 6 -- 22 14 5    Functional -- 1 -- 3 2 1        GAD 7 11/29/2012 11/06/2014   Feeling afraid as if something awful might happen 3 2   Being easily annoyed or irritable 3 1   Worrying too much about different things 3 1   If you checked off any problems, how difficult have these problems made it for you to do your job along with other  people? Extremely difficult Somewhat difficult   Feeling nervous, anxious or on edge 3 1  Trouble relaxing 3 1   Being so restless that it is hard to sit still 3 1   GAD7 Patient Total 21 8   Not being able to stop or control worrying 3 1         Assessment: Kamali Nephew is a 48 year old male with history of SVT and PTSD    Plan:  1. SVT (supraventricular tachycardia)  Pt currently RRR, doing well with beta-blocker. Will be evaluated in arrhythmia clinic this week for possible ablation, ideally stress test as well. Will review cardio recs at next appt.    2. Essential hypertension  BP at goal, continue current regimen.  - lisinopril-hydrochlorothiazide (PRINZIDE, ZESTORETIC) 10-12.5 MG tablet; Take 1 tablet by mouth daily.  Dispense: 30 tablet; Refill: 2    3. GAD (generalized anxiety disorder)  GAD7 score of 8 consistent with GAD. Minimal depressive symptoms on PHQ9. Pt following with psychiatry, has not had good results with several psychotropic meds. Will also refer for counseling as adjunctive therapy.  - [place] PHQ9 FLOWSHEET  - [facility]    4. Insomnia, unspecified insomnia  Reviewed that taking a short-acting benzodiazepine at night is not an ideal solution for insomnia. Will at least make the switch to Ambien. Pt counseled on tapering off benzodiazepine over the next couple days, then initating Ambien. Emphasized not to mix the two meds together and not to consume alcohol with these meds.  - zolpidem (AMBIEN) 10 MG tablet; Take 1 tablet (10 mg) by mouth nightly as needed for Insomnia.  Dispense: 30 tablet; Refill: 0    5. Transaminitis  LFTs in the 300s, significantly increased from previous. Very likely due to alcoholic transaminitis. Evaluate with abdominal US. Encouraged Pt to continue cutting back on EtOH intake. Trend LFTs periodically.  - US Abdomen Complete; Future    6. Allergic rhinitis, unspecified allergic rhinitis type  Trial of intranasal steroids for likely  allergic rhinitis symptoms.  - fluticasone propionate (FLONASE) 50 MCG/ACT nasal spray; Spray 2 sprays into each nostril daily.  Dispense: 1 bottle; Refill: 11    7. Influenza vaccine needed  - Flu Vaccine, IM >=3 Years    8. Need for pneumococcal vaccination  Pneumovax indicated given alcoholism.   - Pneumococcal (PNEUMOVAX-23)    9. Scab  Somewhat concerning appearance for possible underlying dysplasia, but given it has only been a few weeks, will re-evaluate at next appointment and if still not healing, plan for biopsy.    Return in about 4 weeks (around 12/04/2014) for follow-up skin lesion, imaging, cardio recs.    [name], MD     Patient Instruction:   Medications reviewed with patient and medication list reconciled.   Over the counter medications, herbal therapies and supplements reviewed.   Patient's understanding and response to medications assessed.   Barriers to medications assessed and addressed.   Risks, benefits, alternatives to medications reviewed.   Barriers to Learning assessed: none. Patient verbalizes understanding of teaching and instructions.

## 2014-11-07 NOTE — ED Follow-up Note (Signed)
Follow-up type: Callback       Routine ED Patient Call Back    Patient unable to be contacted, no message left

## 2014-11-09 ENCOUNTER — Encounter (HOSPITAL_COMMUNITY): Payer: Commercial Managed Care - HMO | Admitting: Cardiology

## 2014-11-09 ENCOUNTER — Encounter (HOSPITAL_COMMUNITY): Payer: Self-pay | Admitting: Cardiology

## 2014-11-09 ENCOUNTER — Ambulatory Visit: Payer: Commercial Managed Care - HMO | Attending: Emergency Medicine | Admitting: Cardiology

## 2014-11-09 VITALS — BP 140/85 | HR 96 | Temp 98.5°F | Resp 20 | Ht 72.0 in | Wt 264.0 lb

## 2014-11-09 DIAGNOSIS — I10 Essential (primary) hypertension: Secondary | ICD-10-CM | POA: Insufficient documentation

## 2014-11-09 DIAGNOSIS — I471 Supraventricular tachycardia: Principal | ICD-10-CM | POA: Insufficient documentation

## 2014-11-09 MED ORDER — TRAMADOL HCL 50 MG OR TABS
ORAL_TABLET | ORAL | Status: DC
Start: 2012-10-25 — End: 2016-12-09

## 2014-11-09 MED ORDER — MENTHOL (TOPICAL ANALGESIC) 4 % EX GEL
CUTANEOUS | Status: DC
Start: 2012-10-25 — End: 2015-03-07

## 2014-11-09 MED ORDER — HYDROCHLOROTHIAZIDE 12.5 MG OR CAPS
ORAL_CAPSULE | ORAL | Status: DC
Start: ? — End: 2015-03-07

## 2014-11-09 MED ORDER — METHOCARBAMOL 750 MG OR TABS
ORAL_TABLET | ORAL | Status: DC
Start: 2012-10-25 — End: 2015-03-07

## 2014-11-09 MED ORDER — ALPRAZOLAM 2 MG OR TABS
2.00 mg | ORAL_TABLET | Freq: Every evening | ORAL | Status: DC | PRN
Start: ? — End: 2016-08-11

## 2014-11-09 MED ORDER — OMEPRAZOLE 20 MG OR CPDR
20.00 mg | DELAYED_RELEASE_CAPSULE | Freq: Every day | ORAL | Status: DC
Start: ? — End: 2018-06-11

## 2014-11-09 MED ORDER — NAPROXEN 500 MG OR TABS
ORAL_TABLET | ORAL | Status: DC
Start: ? — End: 2015-03-07

## 2014-11-09 NOTE — Telephone Encounter (Signed)
resolved 

## 2014-11-09 NOTE — Progress Notes (Signed)
[facility] ([place] CARDIAC ELECTROPHYSIOLOGY NEW PATIENT CONSULTATION    Encounter Date: 11/09/2014    Demographics:  Patient Name: Brett Palmer   Medical Record #: [id number]   DOB: [birth year]  Age: 48 year old  Sex: male    Requested by:      Aretta Nip Raguet   [place], [place] [facility] Location:      [facility]  [facility] CARDIOVASCULAR  [phone]  [place] [address]    Chief Complaint:   Chief Complaint   Patient presents with    New Patient    Palpitations     SVT       Thank you for referring Brett Palmer for consultation at the [facility] on 11/09/2014.     As you know Brett Palmer is a 48 year old male who has a history of paroxysmal SVT, HTN, PTSD, GERD and migraine, referred to EP for evaluation for SVT.    He is a Marine scientist, and teaches ACLS, has documented his HR to be 200-250 bpm in the past.  Recently diagnosed with SVT when presented to ER 10/19/14 with termination with adenosine.    The patient has had episodes of palpitations off and on since 2011.  He has been having episodes 1x/week, lasting 45 minutes.  They are described as moderate-intensity palpitations that are often associated with chest discomfort, ear fullness and diaphoretic and LH.  No shortness of breath. He has attempted but has not been successful terminating tachycardia with carotid sinus massage, vagal maneuvers, or coughing. Some of the episodes of palpitations have lasted 45 minutes. Metoprolol was started 10/19/14, no episodes since starting metoprolol.    The patient denies recent episodes of  change in exercise tolerance, swelling in legs, paroxysmal nocturnal dyspnea, orthopnea,  syncope.    PAST MEDICAL HISTORY:   Past Medical History   Diagnosis Date    Gastroesophageal reflux disease     Migraine headache      part of gulf war syndrome    PTSD (post-traumatic stress disorder) 1995-present     treated by Dr. Joen Laura at [facility]    -HTN    PAST SURGICAL  HISTORY:   Past Surgical History   Procedure Laterality Date    No past surgical history         ALLERGIES:   Compazine and Latex    MEDICATIONS:   Current Outpatient Prescriptions on File Prior to Visit   Medication Sig Dispense Refill    fluticasone propionate (FLONASE) 50 MCG/ACT nasal spray Spray 2 sprays into each nostril daily. 1 bottle 11    hydrocortisone 2.5 % cream Apply to affected area twice daily as needed for itching. 60 g 0    ketoconazole (NIZORAL) 2 % cream Apply a thin layer as directed twice daily 60 Tube 0    [DISCONTINUED] lisinopril (PRINIVIL, ZESTRIL) 10 MG tablet Take 1 tablet by mouth daily. 30 tablet 5    lisinopril-hydrochlorothiazide (PRINZIDE, ZESTORETIC) 10-12.5 MG tablet Take 1 tablet by mouth daily. 30 tablet 2    metoprolol tartrate (LOPRESSOR) 25 MG tablet Take 1 tablet (25 mg) by mouth 2 times daily. 60 tablet 0    omeprazole (PRILOSEC) 20 MG capsule Take 20 mg by mouth daily.      prazosin (MINIPRESS) 1 MG capsule Take 4 capsules by mouth nightly. 30 capsule 0    sumatriptan (IMITREX) 100 MG tablet Take 1 tablet by mouth once as needed for  Migraine. May repeat in 2 hours in needed 10 tablet 5    zolpidem (AMBIEN) 10 MG tablet Take 1 tablet (10 mg) by mouth nightly as needed for Insomnia. 30 tablet 0     No current facility-administered medications on file prior to visit.       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   Eyes: No visual changes.   GI: No melena, bright red blood per rectum, or abdominal pain.  GU: No hematuria.  Pulmonary: No coughing or wheezing.   Musculoskeletal: No myalgias or arthralgias.   Hematologic: No easy bruising or abnormal bleeding.   Endocrine: Normal.   Neurologic: No new headaches, focal weakness, numbness, or tingling.    The remainder of systems were reviewed and are normal or as per HPI.     PHYSICAL EXAMINATION:  BP 140/85 mmHg   Pulse 96   Temp(Src) 98.5 F (36.9 C) (Oral)   Resp 20   Ht 6' (1.829 m)   Wt 119.75 kg (264 lb)   BMI 35.80 kg/m2   SpO2 95%  Body mass index is 35.8 kg/(m^2).  GENERAL APPEARANCE: Average build and nutrition, no apparent distress noted.  HEAD: Normocephalic, atraumatic.   EYES: Normal conjunctivae, no scleral icterus. EOMI.  NECK: No thyromegaly or lymphadenopathy.  RESPIRATORY: Clear to auscultation bilaterally with no crackles or wheezes, normal respiratory effort.  CARDIOVASCULAR: Apical impulse non-sustained and non-displaced.  Regular rate and rhythm; normal S1, S2; no murmurs, rubs, or gallops; jugular venous pressure normal, no hepatojugular reflux. Carotid pulse is 2+, no bruits.   GI/ABDOMEN: soft, non-tender; no hepatosplenomegaly; normal bowel sounds.  EXTREMITIES: No edema. 2+ DP and PT pulses bilaterally. No clubbing or cyanosis.  SKIN: No rash. Warm, well-perfused.    NEUROLOGIC: Alert and oriented X 3.   Grossly non-focal.  PSYCHIATRIC: Normal speech and affect.     DATA ANALYSIS:   ECG 10/19/14.  SVT at 206 bpm. No discernible P wave    10/19/14  Sinus tachycardia, possible LAE, LPFB, Inferior t wave abnl    Lab Results   Component Value Date    WBC 10.3* 10/19/2014    RBC 5.07 10/19/2014    HGB 16.3 10/19/2014    HCT 46.7 10/19/2014    MCV 92.1  10/19/2014  MCH 32.1* 10/19/2014    MCHC 34.9 10/19/2014    PLT 193 10/19/2014     Lab Results   Component Value Date    CREAT 0.86 10/19/2014    BUN 13 10/19/2014    NA 139 10/19/2014    K 4.0 10/19/2014    CL 98 10/19/2014    BICARB 24 10/19/2014     Lab Results   Component Value Date    CHOL 264 06/25/2010    HDL 78 06/25/2010    LDLCALC 169 06/25/2010    TRIG 84 06/25/2010     No results found for: PTT, INR    Last Echo:  PEND    Last Stress Test: Exercise echo 05/28/11:  Resting Findings  Supine BP:104/83 Standing BP:88/63 HR:90 Rhythm:Sinus    Exercise Findings  The patient exercised 8 minutes and 10 seconds on the Bruce protocol  (13 METS) achieving a maximum heart rate of 175 bpm (100% of predicted)  and a maximal blood pressure of 177/78 mm Hg.  Exercise was stopped because of maximal effort.  There was no chest pain during exercise.    ECG Findings  Resting ECG: Nonspecific ST/T wave abnormalities  Exercise ECG: There were only minor nondiagnostic ST-T wave changes.    Duke treadmill score = 8, Low Risk.    Echo Findings  An LV contrast agent was used to improve endocardial definition.  Resting Echo: No regional wall motion abnormalities are identified.   Exercise Echo: There was normal augmentation of all segments with exercise on   only 2 views, apical 4 and apical 2 chamber views. Poor cardiac windows in the  parasternal views.    Conclusions  1) Negative for ischemia.  2) Excellent exercise capacity for the patient's age.  3) Normal blood pressure response to exercise.  4) Normal heart rate response to exercise     Event monitor 12/11/12:  [facility] Cardiac Outpatient Telemetry was performed from  11/25/2012 - 12/02/2012 and showed no arrhythmias or pauses.   Normal sinus rhythm throughout the monitoring period. No atrial  fibrillation or flutter. Rare premature ventricular contractions  noted. Patient reported symptoms of "light-headed", "Fatigue",  "heart racing" generally correlated with  normal sinus rhythm at  84-91 bpm.    ASSESSMENT AND PLAN:  Brett Palmer is a 48 year old male with the following issues:    #SVT-  Brett Palmer has palpitations and recurrent, symptomatic SVT refractory to medication.   Palpitations have correlated with episodes of supraventricular tachycardia. The differential diagnosis includes AV node reentrant tachycardia (AVNRT), AV reentrant tachycardia using an accessory pathway, or atrial tachycardia.  AVNRT is highest on the differential given lack of P waves on EKG during SVT     We discussed the options, including addition of a second AV nodal blocker vs. catheter ablation.      We discussed the risks of ablation including transseptal puncture  including heart attack, stroke, or death, and the possibility of AV nodal injury requiring a pacemaker. I also described additional risks including, but not limited to: failure to eliminate the tachycardia, vascular injury, pneumothorax, bleeding, infection, phrenic nerve injury, cardiac perforation and tamponade, the latter possibly requiring pericardiocentesis or a surgical repair. Finally, I explained that the patient may need to be only lightly sedated or even awake during portions of the procedure.  All questions were answered.    After a prolonged discussion, Brett Palmer would like to schedule the ablation. Brett Palmer understands and is able to communicate back the risks  and benefits as discussed above and has decided to proceed. We will be in contact to schedule. He will hold the antiarrhythmic therapy including AV nodal blocking agents for 7days prior to the procedure.    -Proceed with EPS /SVT for symptomatic SVT  -Obtain echo to r/o structural abnl    #HTN- Stable.  Continue current meds      I spent a total of 45 minutes face-to-face with the patient and more than half of that time was spent counseling regarding management options for his condition.    -F/u after procedure    Thank you for allowing me to participate in the care of this  patient.    Elesa Massed, MD, MAS

## 2014-11-10 ENCOUNTER — Telehealth (INDEPENDENT_AMBULATORY_CARE_PROVIDER_SITE_OTHER): Payer: Self-pay

## 2014-11-10 NOTE — Telephone Encounter (Signed)
Called patient regarding CC referral by Dr. Salvadore Dom from 11/06/14. Left first voicemail and requested call back. Provided therapist contact information.

## 2014-11-14 NOTE — Telephone Encounter (Signed)
Per chart this was refilled 11/06/2014, closing message

## 2014-11-17 NOTE — Telephone Encounter (Signed)
Called patient for the second time regarding a CC referral from Dr. Celebi. Left a second voicemail for patient with contact information and will wait one week for contact from client before closing the referral.

## 2014-11-21 ENCOUNTER — Other Ambulatory Visit (INDEPENDENT_AMBULATORY_CARE_PROVIDER_SITE_OTHER): Payer: Commercial Managed Care - HMO

## 2014-11-21 ENCOUNTER — Ambulatory Visit (HOSPITAL_BASED_OUTPATIENT_CLINIC_OR_DEPARTMENT_OTHER): Payer: Commercial Managed Care - HMO

## 2014-11-22 ENCOUNTER — Other Ambulatory Visit (INDEPENDENT_AMBULATORY_CARE_PROVIDER_SITE_OTHER): Payer: Self-pay | Admitting: Family Medicine

## 2014-11-22 DIAGNOSIS — I471 Supraventricular tachycardia: Principal | ICD-10-CM

## 2014-11-22 MED ORDER — METOPROLOL TARTRATE 25 MG OR TABS
25.0000 mg | ORAL_TABLET | Freq: Two times a day (BID) | ORAL | Status: DC
Start: 2014-11-22 — End: 2014-11-27

## 2014-11-22 NOTE — Telephone Encounter (Signed)
Patient notified on voicemail RX ready for pick up

## 2014-11-22 NOTE — Telephone Encounter (Signed)
Authorized with 1 refill. Printed and left with Gerald Stabs. Please call Pt to notify, also remind him about getting abdominal ultrasound done and he needs follow-up appt with me, can schedule for mid-February after his ablation procedure.

## 2014-11-22 NOTE — Telephone Encounter (Signed)
Patient calling requesting RX listed below.     metoprolol tartrate (LOPRESSOR) 25 MG tablet [[id number]]     Requesting call back when ready at the front desk, ready for pick up.    Patient verbalized and understood 48-72 hour turnaround time.

## 2014-11-27 ENCOUNTER — Other Ambulatory Visit (INDEPENDENT_AMBULATORY_CARE_PROVIDER_SITE_OTHER): Payer: Self-pay | Admitting: Family Medicine

## 2014-11-27 DIAGNOSIS — I471 Supraventricular tachycardia: Principal | ICD-10-CM

## 2014-11-27 MED ORDER — METOPROLOL TARTRATE 25 MG OR TABS
25.0000 mg | ORAL_TABLET | Freq: Two times a day (BID) | ORAL | Status: DC
Start: 2014-11-27 — End: 2015-03-13

## 2014-11-27 NOTE — Telephone Encounter (Signed)
Approved with 4 refills.

## 2014-12-05 ENCOUNTER — Ambulatory Visit (HOSPITAL_BASED_OUTPATIENT_CLINIC_OR_DEPARTMENT_OTHER): Payer: Commercial Managed Care - HMO

## 2014-12-24 ENCOUNTER — Encounter (HOSPITAL_COMMUNITY): Payer: Self-pay | Admitting: Nurse Practitioner

## 2015-01-02 ENCOUNTER — Telehealth (HOSPITAL_COMMUNITY): Payer: Self-pay | Admitting: Nurse Practitioner

## 2015-01-02 NOTE — Telephone Encounter (Signed)
Called patient message left to call our office    Plan: assess if proceeding with ablation  Patient cancel echocardiogram  He was a no show at pre-op

## 2015-01-03 ENCOUNTER — Telehealth (HOSPITAL_COMMUNITY): Payer: Self-pay | Admitting: Cardiology

## 2015-01-03 NOTE — Telephone Encounter (Signed)
Called and no answer for Mr. JANES COLEGROVE reason on being a no show for ablation this morning.  Emailed patient-    From: Tillie Rung   Sent: Wednesday, January 03, 2015 8:56 AM  To: '[email or web address]  Subject: Re: Cardiac Ablation    Dear Mr. Swor,  Dr. Rigoberto Noel had you scheduled for a cardiac ablation today Feb 3rd.  Please call us regarding rescheduling or canceling procedure.    Regards,    Contractor  [facility] [place]  [address] [facility] [place]  [place], [place] [address]  Ph. [phone]  Fax. [phone]    Live as if you were to die tomorrow. Learn as if you were to live forever. ~ Landis Martins

## 2015-01-03 NOTE — Telephone Encounter (Signed)
[name] called back and rescheduled ablation to April 12th Tuesday xhexk in time 630am.  Please call with pre procedure instructions first week of April, he will be back in town then.

## 2015-01-08 NOTE — Telephone Encounter (Signed)
Message received from [place]. [place] and patient called back and rescheduled ablation to April 12th   Message left to call our office as well as emailed patient

## 2015-01-26 ENCOUNTER — Other Ambulatory Visit (INDEPENDENT_AMBULATORY_CARE_PROVIDER_SITE_OTHER): Payer: Self-pay | Admitting: Family Medicine

## 2015-01-26 NOTE — Telephone Encounter (Signed)
Patient called again today regarding rx below. Re-inform patient of 48-72hours turn around time. Per patient also want PCP to know is out of rx and if possible fill rx today. Please advise.

## 2015-01-26 NOTE — Telephone Encounter (Signed)
Pt request Rx refill.  Advised 48-72 hour turn around time for refill request.  Pt is currently out of Rx and request refill completed sooner than normal turn around time.    alprazolam (XANAX) 2 MG tablet (Order# [id number])

## 2015-01-30 NOTE — Telephone Encounter (Signed)
Therapy discontinued at December appt-- Pt is to take Ambien at night prn insomnia. Does he need an Ambien refill? If this is not working he should have f/u appt with me.

## 2015-01-30 NOTE — Telephone Encounter (Signed)
Called and left message for a patient to call clinic regarding Dr. Pat Patrick message below. If patient call please let him know. He can schedule an appointment with Dr. Salvadore Dom.

## 2015-02-08 ENCOUNTER — Encounter (HOSPITAL_COMMUNITY): Payer: Commercial Managed Care - HMO | Admitting: Adult Health

## 2015-03-01 ENCOUNTER — Ambulatory Visit
Admission: RE | Admit: 2015-03-01 | Discharge: 2015-03-01 | Disposition: A | Discharge status: Home / Self Care | Payer: Commercial Managed Care - HMO | Source: Ambulatory Visit | Attending: Emergency Medicine | Admitting: Emergency Medicine

## 2015-03-01 DIAGNOSIS — I517 Cardiomegaly: Secondary | ICD-10-CM

## 2015-03-01 DIAGNOSIS — I471 Supraventricular tachycardia: Principal | ICD-10-CM | POA: Insufficient documentation

## 2015-03-01 LAB — 2D ECHO WITH IMAGE ENHANCEMENT AGENT IF NECESSARY
IVC Diameter: 2.12 cm
LA Volume Index: 22 ml/m²
LV Ejection Fraction: 63 %
PA Pressure: 14.8 mmHg

## 2015-03-04 ENCOUNTER — Encounter (HOSPITAL_COMMUNITY): Payer: Self-pay | Admitting: Nurse Practitioner

## 2015-03-04 DIAGNOSIS — R002 Palpitations: Principal | ICD-10-CM

## 2015-03-07 ENCOUNTER — Ambulatory Visit: Payer: Commercial Managed Care - HMO | Attending: Family | Admitting: Family

## 2015-03-07 ENCOUNTER — Other Ambulatory Visit: Payer: Commercial Managed Care - HMO | Attending: Nurse Practitioner

## 2015-03-07 ENCOUNTER — Encounter (HOSPITAL_BASED_OUTPATIENT_CLINIC_OR_DEPARTMENT_OTHER): Payer: Self-pay

## 2015-03-07 VITALS — BP 114/73 | HR 84 | Temp 97.4°F | Resp 18 | Ht 73.0 in | Wt 245.0 lb

## 2015-03-07 DIAGNOSIS — Z01818 Encounter for other preprocedural examination: Principal | ICD-10-CM | POA: Insufficient documentation

## 2015-03-07 DIAGNOSIS — R002 Palpitations: Principal | ICD-10-CM | POA: Insufficient documentation

## 2015-03-07 LAB — PROTHROMBIN TIME, BLOOD
INR: 1
PT,Patient: 10.8 s (ref 9.7–12.5)

## 2015-03-07 LAB — BASIC METABOLIC PANEL, BLOOD
Anion Gap: 14 mmol/L (ref 7–15)
BUN: 8 mg/dL (ref 6–20)
Bicarbonate: 27 mmol/L (ref 22–29)
Calcium: 9.6 mg/dL (ref 8.5–10.6)
Chloride: 94 mmol/L — ABNORMAL LOW (ref 98–107)
Creatinine: 0.8 mg/dL (ref 0.67–1.17)
GFR: >60 mL/min
Glucose: 106 mg/dL (ref 70–115)
Potassium: 3.8 mmol/L (ref 3.5–5.1)
Sodium: 135 mmol/L — ABNORMAL LOW (ref 136–145)

## 2015-03-07 LAB — CBC WITH DIFF, BLOOD
ANC-Automated: 3.9 10*3/uL (ref 1.6–7.0)
Abs Eosinophils: 0.2 10*3/uL (ref 0.1–0.7)
Abs Lymphs: 1.8 10*3/uL (ref 0.8–3.1)
Abs Monos: 0.6 10*3/uL (ref 0.2–0.8)
Basophils: 1 % (ref 0–2)
Eosinophils: 4 % (ref 1–4)
Hct: 43.5 % (ref 40.0–50.0)
Hgb: 15.2 gm/dL (ref 13.7–17.5)
Lymphocytes: 27 % (ref 19–53)
MCH: 32.1 pg — ABNORMAL HIGH (ref 26.0–32.0)
MCHC: 34.9 % (ref 32.0–36.0)
MCV: 91.8 um3 (ref 79.0–95.0)
MPV: 9.5 fL (ref 9.4–12.4)
Monocytes: 9 % (ref 5–12)
Plt Count: 179 10*3/uL (ref 140–370)
RBC: 4.74 10*6/uL (ref 4.60–6.10)
RDW: 13.5 % (ref 12.0–14.0)
Segs: 60 % (ref 34–71)
WBC: 6.5 10*3/uL (ref 4.0–10.0)

## 2015-03-07 LAB — APTT, BLOOD: PTT: 32.5 s (ref 25.0–34.0)

## 2015-03-07 NOTE — Patient Instructions (Addendum)
PREOPERATIVE SURGICAL INFORMATION    Your surgery is currently scheduled at [facility] EP LAB hospital/facility/department on 03/13/2015  With a planned report time of [facility], [address], [place], [place] [address]  Please check in at the Admissions Desk 2nd floor       These instructions have been created specifically for you after a very thorough evaluation of your health.   If you have received instructions from your surgeons office which contradict these instructions please contact us as soon as possible so that we can help resolve any confusion.     [facility]: [phone]     On the day of your Surgery or Procedure, please arrive at the time and location provided by your Surgeon.  The time that you have been provided with is an estimate of when you will need to check in for surgery.  If you have any questions regarding your arrival time, please call:   [place] [facility]: [place]:     It is very important that you continue taking all of your regular prescription medications before and after your surgery unless you have been given special instructions.    Regular medications should be taken after midnight on the day of surgery with a sip of water unless you were instructed below not to take them.     Do not take LISINOPRIL  on the day of surgery.    Hold Metoprolol X 7 days per surgeon's request   Please hold all Nsaids (Motrin, Aleve, Ibuprofen, Mobic, Celebrex ), vitamins, supplements, herbs, and fish oil, for 7 days prior to surgery.     You do not have to stop taking acetaminophen (Tylenol), tramadol (Ultram), hydrocodone (Vicodin, Percocet, Norco, and oxycodone) and other pain pills not containing aspirin type ingredients prior to surgery.    Please bring your CPAP mask and tubing with you to surgery.        Preparing for your  [facility] is a non-smoking facility   Contact lenses, all jewelry, piercings, and false teeth must be removed before surgery.    It is best not to bring valuables with you unless you can leave them in the care of a friend or family member while you are in the operating room.    Please wear clean loose-fitting clothes.   Shower before surgery and/or use the special soap according to the special instructions provided.   Bring a picture ID and your insurance card, and be prepared to pay your deductible or co-insurance by cash, check, or credit card when you arrive.   If you will be admitted as an in-patient, [facility] has open visiting hours around the clock, but it is up to the discretion of your nurse to allow friends/family to enter or stay overnight.    Please be sure to arrange for an adult to drive you home after your surgery.  Your surgery will be canceled if you have not arranged this!  You may take a taxi or bus home only if a responsible adult will be able to accompany you home in the taxi or the bus.    Absolutely No Solid Food Is Allowed After Midnight The Night Before Surgery (Regardless Of The Time Your Surgery Will Start).    You May Have Clear Liquids  Until 4 Hours Before Surgery Is Scheduled To Start. (Clear Liquids Include Clear Broth, Black Coffee Or Tea Without Cream, Apple Juice, Jell-O, And Other Liquids That You Can See Through In The Sunlight. Milk And Pulpy Juices Are Not Considered Clear Liquids. Sugar, Artificial Sweeteners, And Honey Are Okay To Add To Other Clear Liquids).    On The Day of Your Surgery:      Check in at the location where your surgery or procedure will be performed.    You will be able to talk with your anesthesiologist and surgeon before surgery and all of your questions will be answered   You will wake up in the recovery area after your operation is over and be able to see your friends/family once you are awake.    You will  receive pain control by means of oral medication, IV medication, and/or nerve blocks/epidural.    You will be admitted to the hospital or discharged home after you have recovered from anesthesia based on your postoperative medical condition.   You will receive additional discharge instructions and information before you are discharged from the recovery room.   You will need to someone to stay at home with you for the first 24 hours after surgery.          Additional detailed information about what to expect before and after surgery is available to you online at:  [email or web address]    Or by searching You-tube for [place] before surgery and [place] after surgery    You medical records are available to you at [email or web address]  Select create account.

## 2015-03-07 NOTE — Addendum Note (Signed)
Addendum  created 03/07/15 1443 by Haynes Hoehn, NP    Modules edited: Medications, Patient Instructions Section    Medications:  Home Meds: Discontinued order #[id number]; Home Meds; Home Meds: Discontinued order #[id number]; Home Meds: Discontinued order #[id number]; Home Meds: Discontinued order #[id number]; Home Meds: Discontinued order #[id number]; Home Meds: Discontinued order #[id number]; Home Meds: Discontinued order #[id number]; Home Meds: Discontinued order #[id number]    Patient Instructions Section:  File: [id number]

## 2015-03-07 NOTE — Anesthesia Preprocedure Evaluation (Addendum)
ANESTHESIA PRE-OPERATIVE EVALUATION    Patient Information    Name: Brett Palmer    MRN: [id number]    DOB: 20-Sep-1966    Age: 49 year old    Sex: male  Procedure(s):  EP STUDY+SVT ABLATION      BP 114/73 mmHg   Pulse 84   Temp(Src) 97.4 F (36.3 C) (Oral)   Resp 18   Ht _0  (1.854 m)   Wt 111.131 kg (245 lb)   BMI 32.33 kg/m2   SpO2 96%   BMI kg/m2: 32.32 kg/m2    Primary language spoken:  English    ROS/Medical History:  General Review & History of Anesthetic Complications:   Patient summary reviewed   No history of anesthetic complications, No PONV, No history of difficult intubation and no family history of anesthetic complications      Cardiovascular:    Exercise tolerance: <4 METS (Able to walk 5oo yards. + SOB and increase in HR. Unable to exercise at this time)  (+)    hypertension well controlled        (-) past MI, CAD    ROS comment: EKG 10/19/2014  Sinus tachycardia  HR=118    ECHO 03/01/2015  Summary:  1. The left ventricular size is normal and systolic function is normal.  2. Mild left ventricular hypertrophy.  3. Mild or grade I (impaired relaxation pattern) LV diastolic filling.  4. Mildly dilated left atrium.  5. No previous study for comparison.   Pulmonary:     (+) sleep apnea on CPAP, , , , ,   (-) pneumonia, recent URI Hematology/Oncology:   - negative ROS   Neuro/Psych:        (+) , psychiatric history (PTSD, insomnia, anxiety),    (-) TIA, CVA   Infectious Disease:   Negative ROS         Endo/Other:   - negative ROS  GI/Hepatic:     (+) GERD well controlled,   (-) hepatitis, liver disease   Renal:    - Negative ROS     No Electrolyte acid base abnormality and IV Contrast in last 24-48 hours   Pregnancy History:             [facility]) Notes:  beta blocker therapy  [facility] Test & records reviewed by [facility] provider  [facility] Echo/ECG/Angio test available.  [facility] labs available.    :                 Last  OSA (STOP BANG) Score:  No Data Recorded    Last OSA Score for   No Data  Recorded                 Past Medical History   Diagnosis Date    Gastroesophageal reflux disease     Migraine headache      part of gulf war syndrome    PTSD (post-traumatic stress disorder) 1995-present     treated by Dr. Joen Laura at [facility]     SVT (supraventricular tachycardia) 2011     Past Surgical History   Procedure Laterality Date    Gallbladder surgery  2011     History   Substance Use Topics    Smoking status: Former Smoker -- 0.50 packs/day for 10 years     Types: Cigarettes     Quit date: 01/11/1990    Smokeless tobacco: Not on file    Alcohol Use: 4.2 - 8.4 oz/week  7-14 Standard drinks or equivalent per week      Comment: wine       Current Outpatient Prescriptions   Medication Sig Dispense Refill    alprazolam (XANAX) 2 MG tablet Take 2 mg by mouth nightly as needed for Insomnia. Pt. Takes 38m at night as needed.      fluticasone propionate (FLONASE) 50 MCG/ACT nasal spray Spray 2 sprays into each nostril daily. 1 bottle 11    hydrochlorothiazide (MICROZIDE) 12.5 MG capsule Take by mouth.      hydrocortisone 2.5 % cream Apply to affected area twice daily as needed for itching. 60 g 0    ketoconazole (NIZORAL) 2 % cream Apply a thin layer as directed twice daily 60 Tube 0    [DISCONTINUED] lisinopril (PRINIVIL, ZESTRIL) 10 MG tablet Take 1 tablet by mouth daily. 30 tablet 5    lisinopril-hydrochlorothiazide (PRINZIDE, ZESTORETIC) 10-12.5 MG tablet Take 1 tablet by mouth daily. 30 tablet 2    Menthol, Topical Analgesic, (BIOFREEZE) 4 % GEL       methocarbamol (ROBAXIN) 750 MG tablet Take by mouth.      metoprolol tartrate (LOPRESSOR) 25 MG tablet Take 1 tablet (25 mg) by mouth 2 times daily. 60 tablet 4    naproxen (NAPROSYN) 500 MG tablet Take by mouth.      omeprazole (PRILOSEC) 20 MG capsule Take by mouth.      omeprazole (PRILOSEC) 20 MG capsule Take 20 mg by mouth daily.      prazosin (MINIPRESS) 1 MG capsule Take 4 capsules by mouth nightly. 30 capsule 0    sumatriptan  (IMITREX) 100 MG tablet Take 1 tablet by mouth once as needed for Migraine. May repeat in 2 hours in needed 10 tablet 5    traMADol (ULTRAM) 50 MG tablet Take by mouth.      zolpidem (AMBIEN) 10 MG tablet Take 1 tablet (10 mg) by mouth nightly as needed for Insomnia. 30 tablet 0     No current facility-administered medications for this visit.     Allergies   Allergen Reactions    Compazine Anxiety    Latex Rash       Labs and Other Data  Lab Results   Component Value Date    NA 139 10/19/2014    K 4.0 10/19/2014    CL 98 10/19/2014    BICARB 24 10/19/2014    BUN 13 10/19/2014    CREAT 0.86 10/19/2014    GLU 121 10/19/2014    [place] 10.5 10/19/2014     Lab Results   Component Value Date    AST 388 10/19/2014    ALT 367 10/19/2014    GGT 297 11/17/2011    ALK 110 10/19/2014    TP 8.4 10/19/2014    ALB 4.8 10/19/2014    TBILI 0.71 10/19/2014    DBILI 0.2 08/20/2010     Lab Results   Component Value Date    WBC 10.3 10/19/2014    RBC 5.07 10/19/2014    HGB 16.3 10/19/2014    HCT 46.7 10/19/2014    MCV 92.1 10/19/2014    MCHC 34.9 10/19/2014    RDW 13.6 10/19/2014    PLT 193 10/19/2014    MPV 9.9 10/19/2014    SEG 58 10/19/2014    LYMPHS 31 10/19/2014    MONOS 8 10/19/2014    EOS 2 10/19/2014     No results found for: INR, PTT  No results found for: ARTPH, ARTPO2, ARTPCO2  Anesthesia Plan:  ASA 2 (Mild systemic disease)     Planned induction method: MAC (Sedation)   Planned monitoring method: Routine monitoring      Comments:    Dental risks explained & discussed

## 2015-03-09 NOTE — Progress Notes (Signed)
Pre op labs reviewed; WNL

## 2015-03-12 ENCOUNTER — Ambulatory Visit (HOSPITAL_BASED_OUTPATIENT_CLINIC_OR_DEPARTMENT_OTHER): Payer: Commercial Managed Care - HMO

## 2015-03-13 ENCOUNTER — Ambulatory Visit (HOSPITAL_BASED_OUTPATIENT_CLINIC_OR_DEPARTMENT_OTHER): Payer: Commercial Managed Care - HMO | Admitting: Certified Registered"

## 2015-03-13 ENCOUNTER — Other Ambulatory Visit (INDEPENDENT_AMBULATORY_CARE_PROVIDER_SITE_OTHER): Payer: Commercial Managed Care - HMO

## 2015-03-13 ENCOUNTER — Ambulatory Visit
Admission: RE | Admit: 2015-03-13 | Discharge: 2015-03-13 | Disposition: A | Discharge status: Home / Self Care | Payer: Commercial Managed Care - HMO | Attending: Cardiology | Admitting: Cardiology

## 2015-03-13 ENCOUNTER — Encounter (HOSPITAL_BASED_OUTPATIENT_CLINIC_OR_DEPARTMENT_OTHER): Admission: RE | Disposition: A | Discharge status: Home Health | Payer: Self-pay | Attending: Cardiology

## 2015-03-13 DIAGNOSIS — I471 Supraventricular tachycardia: Secondary | ICD-10-CM

## 2015-03-13 DIAGNOSIS — Z87891 Personal history of nicotine dependence: Secondary | ICD-10-CM | POA: Insufficient documentation

## 2015-03-13 DIAGNOSIS — I10 Essential (primary) hypertension: Secondary | ICD-10-CM | POA: Insufficient documentation

## 2015-03-13 DIAGNOSIS — F4310 Post-traumatic stress disorder, unspecified: Secondary | ICD-10-CM | POA: Insufficient documentation

## 2015-03-13 DIAGNOSIS — K219 Gastro-esophageal reflux disease without esophagitis: Secondary | ICD-10-CM | POA: Insufficient documentation

## 2015-03-13 LAB — TYPE & SCREEN
ABO/RH: O NEG
Antibody Screen: NEGATIVE

## 2015-03-13 SURGERY — ELECTROPHYSIOLOGY STUDY, WITH ABLATION

## 2015-03-13 MED ORDER — FENTANYL CITRATE 0.05 MG/ML IJ SOLN
50.0000 ug | INTRAMUSCULAR | Status: DC | PRN
Start: 2015-03-13 — End: 2015-03-13
  Administered 2015-03-13 (×3): 50 ug via INTRAVENOUS
  Filled 2015-03-13 (×2): qty 2

## 2015-03-13 MED ORDER — FENTANYL CITRATE 0.05 MG/ML IJ SOLN
25.0000 ug | INTRAMUSCULAR | Status: DC | PRN
Start: 2015-03-13 — End: 2015-03-13

## 2015-03-13 MED ORDER — LABETALOL HCL 5 MG/ML IV SOLN
INTRAVENOUS | Status: DC | PRN
Start: 2015-03-13 — End: 2015-03-13
  Administered 2015-03-13: 5 mg via INTRAVENOUS

## 2015-03-13 MED ORDER — HEPARIN SODIUM (PORCINE) 1000 UNIT/ML IJ SOLN (CUSTOM)
INTRAMUSCULAR | Status: AC
Start: 2015-03-13 — End: 2015-03-13
  Filled 2015-03-13: qty 30

## 2015-03-13 MED ORDER — HEPARIN (PORCINE) IN NACL 2-0.9 UNIT/ML-% IJ SOLN
INTRAMUSCULAR | Status: AC
Start: 2015-03-13 — End: 2015-03-13
  Filled 2015-03-13: qty 500

## 2015-03-13 MED ORDER — PROPOFOL 10 MG/ML IV EMUL
INTRAVENOUS | Status: DC | PRN
Start: 2015-03-13 — End: 2015-03-13
  Administered 2015-03-13: 100 ug/kg/min via INTRAVENOUS

## 2015-03-13 MED ORDER — ONDANSETRON HCL 4 MG/2ML IV SOLN
4.0000 mg | Freq: Once | INTRAMUSCULAR | Status: DC | PRN
Start: 2015-03-13 — End: 2015-03-13

## 2015-03-13 MED ORDER — FENTANYL CITRATE 0.05 MG/ML IJ SOLN
INTRAMUSCULAR | Status: AC
Start: 2015-03-13 — End: 2015-03-13
  Filled 2015-03-13: qty 5

## 2015-03-13 MED ORDER — NALOXONE HCL 0.4 MG/ML IJ SOLN
0.1000 mg | INTRAMUSCULAR | Status: DC | PRN
Start: 2015-03-13 — End: 2015-03-13

## 2015-03-13 MED ORDER — LIDOCAINE HCL 1 % IJ SOLN
INTRAMUSCULAR | Status: AC
Start: 2015-03-13 — End: 2015-03-13
  Filled 2015-03-13: qty 20

## 2015-03-13 MED ORDER — LIDOCAINE HCL 1 % IJ SOLN
0.1000 mL | INTRAMUSCULAR | Status: DC | PRN
Start: 2015-03-13 — End: 2015-03-13

## 2015-03-13 MED ORDER — HYDROMORPHONE HCL 1 MG/ML IJ SOLN
0.5000 mg | INTRAMUSCULAR | Status: DC | PRN
Start: 2015-03-13 — End: 2015-03-13

## 2015-03-13 MED ORDER — MIDAZOLAM HCL 2 MG/2ML IJ SOLN
INTRAMUSCULAR | Status: DC | PRN
Start: 2015-03-13 — End: 2015-03-13
  Administered 2015-03-13: 2 mg via INTRAVENOUS

## 2015-03-13 MED ORDER — HEPARIN SODIUM (PORCINE) 1000 UNIT/ML IJ SOLN (CUSTOM)
INTRAMUSCULAR | Status: DC | PRN
Start: 2015-03-13 — End: 2015-03-13
  Administered 2015-03-13: 5000 [IU] via INTRAVENOUS
  Administered 2015-03-13 (×2): 1000 [IU] via INTRAVENOUS

## 2015-03-13 MED ORDER — MIDAZOLAM HCL 2 MG/2ML IJ SOLN
INTRAMUSCULAR | Status: AC
Start: 2015-03-13 — End: 2015-03-13
  Filled 2015-03-13: qty 2

## 2015-03-13 MED ORDER — HEPARIN (PORCINE) IN NACL 2-0.9 UNIT/ML-% IJ SOLN
INTRAMUSCULAR | Status: AC
Start: 2015-03-13 — End: 2015-03-13
  Filled 2015-03-13: qty 1000

## 2015-03-13 MED ORDER — LACTATED RINGERS IV SOLN
INTRAVENOUS | Status: DC
Start: 2015-03-13 — End: 2015-03-13

## 2015-03-13 MED ORDER — DIPHENHYDRAMINE HCL 50 MG/ML IJ SOLN
INTRAMUSCULAR | Status: DC | PRN
Start: 2015-03-13 — End: 2015-03-13
  Administered 2015-03-13: 12.5 mg via INTRAVENOUS

## 2015-03-13 MED ORDER — LACTATED RINGERS IV SOLN
INTRAVENOUS | Status: DC
Start: 2015-03-13 — End: 2015-03-13
  Administered 2015-03-13: 09:00:00 via INTRAVENOUS

## 2015-03-13 MED ORDER — FENTANYL CITRATE 0.05 MG/ML IJ SOLN
INTRAMUSCULAR | Status: DC | PRN
Start: 2015-03-13 — End: 2015-03-13
  Administered 2015-03-13 (×4): 25 ug via INTRAVENOUS

## 2015-03-13 MED ORDER — MEPERIDINE HCL 25 MG/ML IJ SOLN
12.5000 mg | INTRAMUSCULAR | Status: DC | PRN
Start: 2015-03-13 — End: 2015-03-13

## 2015-03-13 MED ORDER — ISOPROTERENOL HCL 0.2 MG/ML IJ SOLN
INTRAMUSCULAR | Status: AC
Start: 2015-03-13 — End: 2015-03-13
  Filled 2015-03-13: qty 5

## 2015-03-13 MED ORDER — PROTAMINE SULFATE 10 MG/ML IV SOLN
INTRAVENOUS | Status: DC | PRN
Start: 2015-03-13 — End: 2015-03-13
  Administered 2015-03-13: 25 mg via INTRAVENOUS

## 2015-03-13 MED ORDER — ACETAMINOPHEN 10 MG/ML IV SOLN
INTRAVENOUS | Status: DC | PRN
Start: 2015-03-13 — End: 2015-03-13
  Administered 2015-03-13: 1000 mg via INTRAVENOUS

## 2015-03-13 SURGICAL SUPPLY — 19 items
BLANKET BAIR HUGGER UNDERBODY (Misc Medical Supply) ×2 IMPLANT
CATHETER 3D MAPPING INTELLAMAP ORION HIGH RESOLUTION (Procedural wires/sheaths/catheters/balloons/dilators) ×2 IMPLANT
CATHETER BLAZER II HTD ABLATION ASYMMETRIC 4 7FR 4MM (Drains/Catheter/Tubes/Reservoir) ×2 IMPLANT
CATHETER CS F CURVE 6F 115CM WITH AUTO ID (Drains/Catheter/Tubes/Reservoir) ×2
CATHETER INTRODUCER 8.5 FR 63CM SRO (Drains/Catheter/Tubes/Reservoir) ×2
CATHETER SUPREME CRD 6FR (Drains/Catheter/Tubes/Reservoir) ×4
CATHETER SUPREME DAO 6FR (Drains/Catheter/Tubes/Reservoir) ×2
COVER FLEXIBLE LIGHT HANDLE SINGLE- STERILE (Drape/Gowns/Gloves/Pack) ×2
COVER PROBE MICROTEK INTRAOPERATIVE 5" X 96" PC1308 (Drapes/towels) ×2 IMPLANT
DRAPE IOBAN 2 ANTIMICROBIAL 23" X 17" (Drapes/towels) ×1
FILM IOBAN 2 ANTIMICROBIAL 23" X 17" (Drapes/towels) ×1 IMPLANT
KIT ABLATION RHYTHMIA LOCATION REFERENCE PATCH (M004RAPATCH10) (Cautery) ×4 IMPLANT
KIT MERIT MAK MINI ACCESS 4FR INTRO 21G X 7CM (Procedural wires/sheaths/catheters/balloons/dilators) ×2 IMPLANT
PACK EP CUSTOM (Drape/Gowns/Gloves/Pack) ×2 IMPLANT
PAD DEFIB CPR ADULT STAT-PADZ (Misc Medical Supply) ×2 IMPLANT
PAD GROUND LESION GENERATOR (Misc Medical Supply) ×4
RESTRAINTS POSEY WRIST LARGE QUILTED (Misc Medical Supply) ×2 IMPLANT
SHEATH 6FR FASTCATH INTRO (Drains/Catheter/Tubes/Reservoir) ×2 IMPLANT
SHEATH 7FR FASTCATH INTRO (Drains/Catheter/Tubes/Reservoir) ×2

## 2015-03-13 NOTE — H&P (Signed)
History and Physical    BP 126/89 mmHg   Pulse 111   Temp(Src) 97.3 F (36.3 C)   Resp 16   Ht 6' (1.829 m)   Wt 111.131 kg (245 lb)   BMI 33.22 kg/m2   SpO2 95%      Attending MD: Dr. Elesa Massed, MD    49 year old male with paroxysmal SVT. He had adenosine terminated SVT in November 2015. In general, his symptoms are managed with metoprolol but he still has a few breakthrough palpitation episodes. Since being off his beta blocker, he states he has had palpitations daily, sometimes lasting all day. He denies any chest pain, shortness of breath.     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    Pain Assessment: Intensity 0 Location:  Quality:     Past Medical History   Diagnosis Date    Gastroesophageal reflux disease     Migraine headache      part of gulf war syndrome    PTSD (post-traumatic stress disorder) 1995-present     treated by Dr[facility] Joen Laura at [facility]     SVT (supraventricular tachycardia) 2011     Past Surgical History   Procedure Laterality Date    Gallbladder surgery  2011     Allergies   Allergen Reactions    Compazine Anxiety    Latex Rash     Current Facility-Administered Medications   Medication     lactated ringers infusion    lidocaine 1% injection 0.1 mL     Review of Systems - Constitutional: negative.  CV: palpitations.  Resp: negative.  Physical Exam: General Appearance: healthy, alert, no distress, pleasant affect, cooperative.  Heart:  normal rate and regular rhythm, no murmurs, clicks, or gallops.  Lungs: clear to auscultation and percussion, no chest deformities noted.    Assessment and Care Plan  49 year old male here with paroxysmal SVT. Will proceed with diagnostic EP study with probable ablation.    Pain management needs/options discussed? yes  Does this patient have Advance Directives?   Resuscitative status: Full Code, Full Care      The risks, benefits and alternatives of the planned procedure have been discussed with the patient and/or her legal representative, all questions have been answered and they agree to proceed.    The risks,  benefits, and alternatives to the procedure have been thoroughly reviewed with the patient. These risks include, but are not limited to the following.Marland KitchenMarland Kitchen    1) The risk of damage to the AV node, requiring permanent pacemaker has been discussed with the patient.     2) The risk of stroke from left sided ablation and the need for anticoagulation during the procedure has been discussed.    3) The risk of cardiac trauma requiring pericardiocentesis or emergent surgery has been discussed with patient.    4) The risk of bleeding requiring a blood transfusion. The risk of infection with the HIV virus (1/500,000) and Hepatitis virus (1/60,000) has been reviewed. Autologous blood donation has been discussed.    5) The risks of infection requiring antibiotics, a prolonged hospital course, and possible reoperation have been reviewed. Wound issues have been discussed.      Shrinivas Hebsur      ELECTROPHYSIOLOGY ATTENDING HISTORY AND PHYSICAL ATTESTATION    Subjective    Chief complaint:  SVT    History of present illness:  49 year old male who has a history of paroxysmal  SVT, HTN, PTSD, GERD and migraine, referred for EPS/ablation for SVT.  Recently diagnosed with SVT when presented to ER 10/19/14 with termination with adenosine. The patient has had episodes of palpitations off and on since 2011. He has been having episodes 1x/week, lasting 45 minutes. They are described as moderate-intensity palpitations that are often associated with chest discomfort, ear fullness and diaphoretic and LH. Due to symptomatic SVT, he is referred for EPS/SVT ablation.    See history and physical for further details of the patient's history.    Objective    I have examined the patient and concur with the fellow exam.    Assessment and Plan    I agree with the fellow care plan.    See the fellow history and physical for further details.    #Symptomatic SVT  -Proceed with EPS/SVT ablation    Elesa Massed, MD, MAS

## 2015-03-13 NOTE — Plan of Care (Signed)
Problem: Discharge Planning  Goal: Participation in care planning  Outcome: Met  Discharge orderws received and addressed w/ patient and wife at bedside. Addressed medication changes, follow up apt, and activity restrictions. Both verbalized understanding and had no questions.

## 2015-03-13 NOTE — Discharge Instructions (Signed)
Diagnosis and Reason for Admission    You were admitted to the hospital for the following reason(s):  SVT evaluation2    Your full diagnosis list is located on this After Visit Summary in the Hospital Problems section.    What [facility] Hospital Stay    The main treatment(s) done for you during this hospitalization are listed below:    Electrophysiology study and radiofrequency catheter ablation.    The following evaluation is still important to complete after discharge from the hospital:  SVT ablation    Instructions for After Discharge    Your diet at home should be a low-salt diet.    Follow up with arrhythmia Clinic in 4 weeks.    Your activity level at home should be limited for one week, as follows:  -- No lifting, pushing, or pulling more than 10 pounds for one week.  -- No strenuous exercise for one week.  -- No prolonged standing for one week.    Wound care instructions:  -- Keep area clean and dry.  -- You can shower.  -- Do not submerge the wound under water for one week.  -- If oozing or bleeding occurs at a groin puncture site, apply firm direct pressure for 15 to 30 minutes while lying as flat as possible.  Stay resting in bed for 6 hours after the bleeding stops.    Your medication list is located on this After Visit Summary in the Current Discharge Medication List section.  Your nurse will review this information with you before you leave the hospital.    It is very important for you to keep a current medication list with you in order to assist your doctors with your medical care.  Bring this After Visit Summary with you to your follow up appointments.    What to Expect After [place]    During the first 72 hours following an ablation, many patients experience:   Mild shortness of breath    Fatigue    Chest discomfort and/or chest tightness    To prevent and relieve these symptoms, we ask that you take acetaminophen (Tylenol) 650 to 1000 mg orally every 4 to 6 hours as needed.  You  should not take more than 4000 mg of acetaminophen in a 24-hour period.  If your symptoms are not relieved, please call our clinic office at [phone] during office hours, or call [phone] after hours and ask for the Electrophysiology provider on call.    Your doctor may also prescribe an anti-inflammatory drug called colchicine.  Colchicine is a well-established anti-inflammatory drug that can help control the inflammation and prevent pericarditis (inflammation of the sac around the heart) from happening weeks to months later.    After undergoing an ablation, it is also common for patients to experience:   Skipped heart beats    The feeling that their heart may be racing    Short periods of atrial fibrillation    This period of heart beat irritability is normal and usually occurs during the first 2 to 8 weeks after an ablation.  Please call the office if you experience any such episodes after the 8 week post-ablation period, or if you experience episodes of atrial fibrillation lasting longer than 24 hours.    Reasons to Contact a Doctor Urgently    Call 911 or return to the hospital immediately if:   The area where the catheter was put in is bleeding and will not stop.  The area the catheter was put in is warm, red, swollen, or has pus coming from it.    You faint (pass out).   Your arm or leg feels warm, tender, and painful. It may look swollen and red.   Your hand or foot becomes numb (loses feeling), cold, or turns blue.   You have signs or symptoms of a heart attack:   o Chest pain or discomfort that spreads to your arms, jaw, or back.  o New, sudden back pain.  o Nausea (feeling sick to your stomach).  o Trouble breathing.  o Sweating.  o Lips or nailbeds that turn blue or white in color.  o This is an emergency.  Call 911 or 0 (operator) for an ambulance to get to the nearest hospital or clinic.  Do not drive yourself!   You have signs and symptoms of a stroke:   The following signs and  symptoms may happen suddenly:  o A very bad headache.  This may feel like the worst headache of your life.  o Too dizzy to stand.  o Weakness or numbness in your arm, leg, or face. This may happen on only one side of your body.  o Confusion and problems speaking or understanding.  o Not able to see out of one or both of your eyes.  o This is an emergency.  Call 911 or 0 (operator) for an ambulance to get to the nearest hospital.  Do not drive yourself!    You should contact either your primary care physician or your hospital cardiologist for any of the following reasons:    You feel dizzy or light-headed.   You feel new or increased palpitations in your chest, neck or throat.   You have a fever (increased body temperature).    You have chest pain or trouble breathing that is getting worse over time.   You have questions or concerns about your procedure or care.  If you have any questions about your hospital care, your medications, or if you have new or concerning symptoms soon after going home from the hospital, and you need to contact your hospital cardiologist, then do the following:  -- From 8:30 AM to 4:30 PM:  Call the Cardiac Electrophysiology Department for questions and concerns at [phone].  -- After hours:  Call the paging operator at [phone] and ask for the on-call electrophysiology doctor.    Once you are able to see your primary care physician (PCP), your PCP will then be responsible for further medication refills, or appointment referrals.    What Needs to Happen Next After Discharge -- Appointments and Follow Up    Any appointments already scheduled at [facility] will be listed in the Future Appointments section at the top of this After Visit Summary.  Any appointments that have been requested, but have not yet been scheduled, will be listed below that under Post Discharge Referrals.    Sometimes tests performed in the hospital do not yet have results by the time a patient goes home.   The following key tests will need to be followed up at your next appointment: None    Medical Home Information    Your primary care provider or clinic currently on file at [place] is: [name]    Handouts Given to You (if applicable)

## 2015-03-14 ENCOUNTER — Other Ambulatory Visit: Payer: Self-pay | Admitting: Case Management

## 2015-03-15 ENCOUNTER — Other Ambulatory Visit: Payer: Self-pay | Admitting: Case Management

## 2015-03-22 ENCOUNTER — Other Ambulatory Visit: Payer: Self-pay | Admitting: Case Management

## 2015-03-30 ENCOUNTER — Telehealth (HOSPITAL_COMMUNITY): Payer: Self-pay | Admitting: Cardiology

## 2015-03-30 NOTE — Telephone Encounter (Signed)
Spoke with patient. States since procedure (4/12) he has continuously felt "heart beating"  States he is SOB with minimal exertion and takes a long time to recover after after moderate exertion.   States he is very fatigued.   Denies any CP, swelling, orthopnea.   Reports HR at 90 after taking Metoprolol 25 mg.  Instructed to report to ED should he feel, CP, SOB that does not subside, verbalizes understanding.  Routing to Dr. Rigoberto Noel, EP NP and Primary RN for follow up.

## 2015-03-30 NOTE — Telephone Encounter (Signed)
SP Ablation 03/13/2015 - patient states his HR resting has remained around 110 and with any exertion the HR will be 130 to 140. Patient states he is still very tired and short of breath - Patient states he started back on the metoprolol tartrate (LOPRESSOR) 25 MG tablet 1 in the morning and 1 at night about a week after procedure which will drop the HR to 88 but still remains very fatigued.   - Patient is asking if this will change because he seems to feel the same as before the procedure.  [phone]

## 2015-03-30 NOTE — Telephone Encounter (Signed)
Brief EP Note:    Agree with ER precautions.  Let's have EP NP check in and if does not improve can consider further workup.    Elesa Massed, MD, MAS

## 2015-04-03 NOTE — Telephone Encounter (Signed)
Patient calling to return a call from Leron Croak NP.  Please call again

## 2015-04-03 NOTE — Telephone Encounter (Signed)
Called patient message left to call our office

## 2015-04-04 NOTE — Anesthesia Postprocedure Evaluation (Signed)
Anesthesia Transfer of Care Note    Patient: Brett Palmer    Procedures performed: Procedure(s):  EP STUDY+SVT ABLATION    Vital signs: stable           Anesthesia Post Note    Patient: Brett Palmer    Procedure(s) Performed: Procedure(s):  EP STUDY+SVT ABLATION      Final anesthesia type: MAC (Sedation)    Patient location: PACU    Post anesthesia pain: adequate analgesia    Post assessment: no apparent anesthetic complications, tolerated procedure well and no evidence of recall    Mental status: awake, alert  and oriented    Airway Patent: Yes    Last Vitals:   Filed Vitals:    03/13/15 1830   BP: 148/88   Pulse: 84   Temp:    Resp: 22   SpO2:        Post vital signs: stable    Hydration: adequate    N/V:no    Anesthetic complications: no        Plan of care per primary team.

## 2015-04-06 ENCOUNTER — Other Ambulatory Visit: Payer: Self-pay | Admitting: Case Management

## 2015-04-09 NOTE — Telephone Encounter (Signed)
Called patient message left to call our office

## 2015-04-12 ENCOUNTER — Telehealth (HOSPITAL_COMMUNITY): Payer: Self-pay | Admitting: Nurse Practitioner

## 2015-04-12 DIAGNOSIS — I471 Supraventricular tachycardia: Principal | ICD-10-CM

## 2015-04-12 NOTE — Telephone Encounter (Signed)
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

## 2015-04-17 ENCOUNTER — Ambulatory Visit
Admission: RE | Admit: 2015-04-17 | Discharge: 2015-04-17 | Disposition: A | Discharge status: Home / Self Care | Payer: Commercial Managed Care - HMO | Source: Ambulatory Visit | Attending: Cardiovascular Disease | Admitting: Cardiovascular Disease

## 2015-04-17 ENCOUNTER — Telehealth (HOSPITAL_COMMUNITY): Payer: Self-pay | Admitting: Nurse Practitioner

## 2015-04-17 DIAGNOSIS — R9431 Abnormal electrocardiogram [ECG] [EKG]: Principal | ICD-10-CM

## 2015-04-17 DIAGNOSIS — I4510 Unspecified right bundle-branch block: Secondary | ICD-10-CM | POA: Insufficient documentation

## 2015-04-17 DIAGNOSIS — Z136 Encounter for screening for cardiovascular disorders: Secondary | ICD-10-CM | POA: Insufficient documentation

## 2015-04-17 DIAGNOSIS — I471 Supraventricular tachycardia: Secondary | ICD-10-CM

## 2015-04-17 DIAGNOSIS — I450 Right fascicular block: Secondary | ICD-10-CM | POA: Insufficient documentation

## 2015-04-17 DIAGNOSIS — I4519 Other right bundle-branch block: Secondary | ICD-10-CM | POA: Insufficient documentation

## 2015-04-17 DIAGNOSIS — I10 Essential (primary) hypertension: Secondary | ICD-10-CM

## 2015-04-17 DIAGNOSIS — I4581 Long QT syndrome: Secondary | ICD-10-CM | POA: Insufficient documentation

## 2015-04-17 NOTE — Telephone Encounter (Signed)
Spoke to patient and feels like HR is elevated with slightly movement but does go to 80's at rest    Review of EKG from today and noted T-wave inversion throughout all leads spoke to patient and he states since 2011 his T-waves inversion comes and goes as well  Sometime just in certain leads     He does note DOE since 2011 in which over the years this seem a little worse and notice interfering with activity now     Echocardiogram 01/2015 with normal systolic function with mild LVH    Stress echo 2012 was negative for ischemia    Plan: Stress mibi if negative will order CT chest to evaluate for PE  \  Message routed to Dr. Rigoberto Noel to review EKG's as well when he returns tomorrow    Spoke to patient and he will call and schedule

## 2015-04-18 ENCOUNTER — Telehealth (HOSPITAL_COMMUNITY): Payer: Self-pay | Admitting: Nurse Practitioner

## 2015-04-18 ENCOUNTER — Telehealth (HOSPITAL_BASED_OUTPATIENT_CLINIC_OR_DEPARTMENT_OTHER): Payer: Self-pay | Admitting: Internal Medicine

## 2015-04-18 NOTE — Telephone Encounter (Signed)
I have attempted to reach the patient and schedule a Nuclear Medicine appointment. I was able to leave a message and provide our phone number [phone], so the patient may call us back to schedule at their earliest convenience.

## 2015-04-18 NOTE — Telephone Encounter (Signed)
zio monitor order,faxed order.patch to be mailed For status update please call [employer] [phone]

## 2015-04-22 DIAGNOSIS — I4510 Unspecified right bundle-branch block: Secondary | ICD-10-CM

## 2015-04-22 DIAGNOSIS — I450 Right fascicular block: Secondary | ICD-10-CM

## 2015-04-22 DIAGNOSIS — I4581 Long QT syndrome: Secondary | ICD-10-CM

## 2015-04-22 DIAGNOSIS — Z136 Encounter for screening for cardiovascular disorders: Secondary | ICD-10-CM

## 2015-04-22 DIAGNOSIS — R9431 Abnormal electrocardiogram [ECG] [EKG]: Secondary | ICD-10-CM

## 2015-04-22 DIAGNOSIS — I4519 Other right bundle-branch block: Secondary | ICD-10-CM

## 2015-04-22 LAB — ECG 12-LEAD
ATRIAL RATE: 84 {beats}/min
ECG INTERPRETATION: NORMAL
P AXIS: 51 degrees
PR INTERVAL: 158 ms
QRS INTERVAL/DURATION: 92 ms
QT: 392 ms
QTC INTERVAL: 463 ms
R AXIS: 240 degrees
T AXIS: -78 degrees
VENTRICULAR RATE: 84 {beats}/min

## 2015-04-24 NOTE — Telephone Encounter (Signed)
Pt contacted in alternate encounter. Closing this enc. MIBI and event monitor were ord

## 2015-04-27 ENCOUNTER — Ambulatory Visit
Admission: RE | Admit: 2015-04-27 | Discharge: 2015-04-27 | Disposition: A | Discharge status: Home / Self Care | Payer: Commercial Managed Care - HMO | Source: Ambulatory Visit | Attending: Nurse Practitioner | Admitting: Nurse Practitioner

## 2015-04-27 DIAGNOSIS — R9431 Abnormal electrocardiogram [ECG] [EKG]: Secondary | ICD-10-CM | POA: Insufficient documentation

## 2015-04-27 DIAGNOSIS — R0602 Shortness of breath: Principal | ICD-10-CM | POA: Insufficient documentation

## 2015-04-27 DIAGNOSIS — I471 Supraventricular tachycardia: Secondary | ICD-10-CM | POA: Insufficient documentation

## 2015-05-07 ENCOUNTER — Ambulatory Visit (HOSPITAL_COMMUNITY): Payer: Commercial Managed Care - HMO | Admitting: Nurse Practitioner

## 2015-05-15 ENCOUNTER — Ambulatory Visit
Admit: 2015-05-15 | Discharge: 2015-05-15 | Payer: Commercial Managed Care - HMO | Attending: Nurse Practitioner | Admitting: Nurse Practitioner

## 2015-05-15 DIAGNOSIS — I471 Supraventricular tachycardia: Principal | ICD-10-CM

## 2015-07-04 ENCOUNTER — Other Ambulatory Visit (INDEPENDENT_AMBULATORY_CARE_PROVIDER_SITE_OTHER): Payer: Self-pay | Admitting: Family Medicine

## 2015-07-04 ENCOUNTER — Telehealth (HOSPITAL_COMMUNITY): Payer: Self-pay | Admitting: Nurse Practitioner

## 2015-07-04 DIAGNOSIS — J309 Allergic rhinitis, unspecified: Principal | ICD-10-CM

## 2015-07-04 MED ORDER — FLUTICASONE PROPIONATE 50 MCG/ACT NA SUSP
2.0000 | Freq: Every day | NASAL | 11 refills | Status: DC
Start: 2015-07-04 — End: 2016-05-29

## 2015-07-04 NOTE — Telephone Encounter (Signed)
From: Brett Palmer  To: [name], MD  Sent: 07/04/2015 10:16 AM PDT  Subject: Medication Renewal Request    Original authorizing provider: [name], MD    Brett Palmer would like a refill of the following medications:  fluticasone propionate (FLONASE) 50 MCG/ACT nasal spray [name], MD]    Preferred pharmacy: [facility] [phone] this will be my new phar    Comment:  Please call in to [facility] [phone] Thank You [name]

## 2015-07-04 NOTE — Telephone Encounter (Signed)
Event Monitor Results in Epic.     Routing to EP NPs for Event Monitor interpretation.  Routing to primary RN for follow-up.

## 2015-07-04 NOTE — Telephone Encounter (Signed)
Patient is requesting refill of medication, please see orders for preloaded prescription refill of fluticasone propionate (FLONASE) 50 MCG/ACT nasal spray .    Last visit with this physician:    11/06/14.  Next appointment with this physician:   Visit date not found    Last visit with this office:     Visit date not found  Next appointment with this office:    Visit date not found    HM Items that are overdue or due soon include:  There are no preventive care reminders to display for this patient.

## 2015-07-04 NOTE — Telephone Encounter (Signed)
Patient calling for the results of the Event monitor -  [phone]  -

## 2015-07-04 NOTE — Telephone Encounter (Signed)
Authorized.

## 2015-07-10 NOTE — Telephone Encounter (Signed)
Called patient message left to called our office      Monitor results mailed to patient     If patient calls back please relay results   Predominant underlying rhythm was Sinus Rhythm. Very short SVT. Patient triggered events correlated with sinus rhythm, occasionally with PACs and PVCs.   -no change in treatment     Also needs f/u appt with Dr. Rigoberto Noel in October

## 2015-07-10 NOTE — Telephone Encounter (Signed)
LMOM to call and schedule w/[facility] in October.

## 2015-07-26 ENCOUNTER — Telehealth (INDEPENDENT_AMBULATORY_CARE_PROVIDER_SITE_OTHER): Payer: Self-pay | Admitting: Family Medicine

## 2015-07-26 DIAGNOSIS — Z111 Encounter for screening for respiratory tuberculosis: Principal | ICD-10-CM

## 2015-07-26 NOTE — Telephone Encounter (Signed)
Done - please notify Pt via [place].

## 2015-07-26 NOTE — Telephone Encounter (Signed)
Teed up order and routed to Dr. Celebi.

## 2015-07-26 NOTE — Telephone Encounter (Signed)
Pt calling to request a PPD TB blood test. Per patient he will need it by the 30th for his work. Pt would like to be notified through my chart once order is in the system

## 2015-07-27 NOTE — Telephone Encounter (Signed)
Patient notified at mychart.

## 2015-07-30 ENCOUNTER — Other Ambulatory Visit: Payer: Commercial Managed Care - HMO | Attending: Family Medicine

## 2015-07-30 DIAGNOSIS — Z111 Encounter for screening for respiratory tuberculosis: Principal | ICD-10-CM | POA: Insufficient documentation

## 2015-08-01 LAB — QUANTIFERON-TB, BLOOD
Quantiferon TB: NEGATIVE
TB Antigen - Nil: 0.031 IU/mL

## 2015-08-20 ENCOUNTER — Encounter (INDEPENDENT_AMBULATORY_CARE_PROVIDER_SITE_OTHER): Payer: Commercial Managed Care - HMO | Admitting: Family Medicine

## 2015-09-03 ENCOUNTER — Other Ambulatory Visit: Payer: Commercial Managed Care - HMO | Attending: Family Medicine | Admitting: Family Medicine

## 2015-09-03 ENCOUNTER — Encounter (INDEPENDENT_AMBULATORY_CARE_PROVIDER_SITE_OTHER): Payer: Self-pay | Admitting: Family Medicine

## 2015-09-03 VITALS — BP 134/84 | HR 86 | Temp 98.7°F | Resp 20 | Ht 72.0 in | Wt 263.0 lb

## 2015-09-03 DIAGNOSIS — Z23 Encounter for immunization: Secondary | ICD-10-CM

## 2015-09-03 DIAGNOSIS — R7401 Elevation of levels of liver transaminase levels: Secondary | ICD-10-CM

## 2015-09-03 DIAGNOSIS — R740 Nonspecific elevation of levels of transaminase and lactic acid dehydrogenase [LDH]: Secondary | ICD-10-CM | POA: Insufficient documentation

## 2015-09-03 DIAGNOSIS — C44319 Basal cell carcinoma of skin of other parts of face: Secondary | ICD-10-CM

## 2015-09-03 DIAGNOSIS — D485 Neoplasm of uncertain behavior of skin: Secondary | ICD-10-CM | POA: Insufficient documentation

## 2015-09-03 LAB — COMPREHENSIVE METABOLIC PANEL, BLOOD
ALT (SGPT): 320 U/L — ABNORMAL HIGH (ref 0–41)
AST (SGOT): 405 U/L — ABNORMAL HIGH (ref 0–40)
Albumin: 4.4 g/dL (ref 3.5–5.2)
Alkaline Phos: 87 U/L (ref 40–129)
Anion Gap: 16 mmol/L — ABNORMAL HIGH (ref 7–15)
BUN: 6 mg/dL (ref 6–20)
Bicarbonate: 26 mmol/L (ref 22–29)
Bilirubin, Tot: 0.75 mg/dL (ref <1.20)
Calcium: 9.4 mg/dL (ref 8.5–10.6)
Chloride: 99 mmol/L (ref 98–107)
Creatinine: 0.79 mg/dL (ref 0.67–1.17)
GFR: >60 mL/min
Glucose: 94 mg/dL (ref 70–99)
Potassium: 3.9 mmol/L (ref 3.5–5.1)
Sodium: 141 mmol/L (ref 136–145)
Total Protein: 7.8 g/dL (ref 6.0–8.0)

## 2015-09-03 LAB — LIPID(CHOL FRACT) PANEL, BLOOD
Cholesterol: 232 mg/dL — ABNORMAL HIGH (ref <200)
HDL-Cholesterol: 51 mg/dL
LDL-Chol (Calc): 158 mg/dL (ref <160)
Non-HDL Cholesterol: 181 mg/dL
Triglycerides: 115 mg/dL (ref 10–170)

## 2015-09-03 LAB — GLYCOSYLATED HGB(A1C), BLOOD: Glyco Hgb (A1C): 5.6 % (ref 4.8–5.9)

## 2015-09-03 NOTE — Interdisciplinary (Signed)
Pre-visit chart review and huddle completed with staff and physician.    Outstanding labs, imaging and consults reviewed and identified.    Health maintanence issues identified and addressed:    Health Maintenance   Topic Date Due    INFLUENZA VACCINE  08/02/2015    IMM_TD/TDAP=>49 YO  07/11/2018

## 2015-09-03 NOTE — Progress Notes (Signed)
Family Medicine Clinic Progress Note    CC:   Chief Complaint   Patient presents with    Derm Problem     R ear scab         Subjective: Brett Palmer is a 49 year old male with history of SVT who is here for f/u scab.    #Scab:  Has the same scab in front of his R ear, has not healed. First appeared around November 2015, never followed up despite my counseling due to being out of town for work.    #SVT:  Had cardiac ablation back in April 2016. No chest pain or palpitations at rest, but notes that HR goes up easily to 150s with mild exertion.  Also had nuclear stress test that was normal.    #Transaminitis:   Noted on labs back in November. No trending since.    Patient Active Problem List   Diagnosis    Hypertensive disorder    Anxiety    Palpitations    ETOH abuse    PTSD (post-traumatic stress disorder)    Routine lab draw    Transaminitis     Outpatient Medications Prior to Visit   Medication Sig Dispense Refill    alprazolam (XANAX) 2 MG tablet Take 2 mg by mouth nightly as needed for Insomnia. Pt. Takes 4mg  at night as needed.      fluticasone propionate (FLONASE) 50 MCG/ACT nasal spray Spray 2 sprays into each nostril daily. 1 bottle 11    lisinopril (PRINIVIL, ZESTRIL) 10 MG tablet Take 1 tablet by mouth daily. 30 tablet 5    lisinopril-hydrochlorothiazide (PRINZIDE, ZESTORETIC) 10-12.5 MG tablet Take 1 tablet by mouth daily. 30 tablet 2    omeprazole (PRILOSEC) 20 MG capsule Take by mouth.      sumatriptan (IMITREX) 100 MG tablet Take 1 tablet by mouth once as needed for Migraine. May repeat in 2 hours in needed 10 tablet 5    traMADol (ULTRAM) 50 MG tablet Take by mouth.      zolpidem (AMBIEN) 10 MG tablet Take 1 tablet (10 mg) by mouth nightly as needed for Insomnia. 30 tablet 0     No facility-administered medications prior to visit.      Immunization History   Administered Date(s) Administered    Influenza Vaccine >=3 Years 10/04/2012, 11/06/2014, 09/03/2015    Pneumococcal 23 Vaccine  (PNEUMOVAX-23) 11/06/2014     Allergies   Allergen Reactions    Compazine Anxiety    Latex Rash       Objective:  Vitals:    09/03/15 1352   BP: 134/84   Pulse: 86   Resp: 20   Temp: 98.7 F (37.1 C)   TempSrc: Oral   SpO2: 96%   Weight: 119.3 kg (263 lb)   Height: 6' (1.829 m)     Estimated body mass index is 35.67 kg/(m^2) as calculated from the following:    Height as of this encounter: 6' (1.829 m).    Weight as of this encounter: 119.3 kg (263 lb).    Physical Examination  General Appearance: healthy, alert, no distress, pleasant affect, cooperative.  Skin:  11mm x 38mm crusty scab in R pre-auricular space with central clearing and slightly rolled borders    Procedure  Time Out: performed at 2:15 PM.  Correct patient:  yes  Correct procedure:  yes  Correct side/site:  yes  Correct position:  yes  Correct equipment and/or implants:  yes   Procedure note: The  risks and benefits of the procedure were explained and consent was obtained.  A "time out"/questioning of patient was performed to verify correct location of procedure. Area was cleansed with alcohol Area under the lesion was injected with 71ml of 1% lidocaine with epi. Lesion was shaved off with derma blade and specimen was sent to pathology. Hemostasis was achieved with direct pressure and Dry-Sol.  Area was cleaned and dressed. Pt tol well without complications. EBL < 32ml. After care instructions discussed including cleaning and signs/symptoms of infection.       Labs reviewed:  COMPREHENSIVE METABOLIC PANEL, BLOOD    Collection Time: 10/19/14  6:22 PM   Result Value Ref Range    Glucose 121 (H) 70 - 115 mg/dL    BUN 13 6 - 20 mg/dL    Creatinine 0.86 0.67 - 1.17 mg/dL    GFR >60 mL/min    Sodium 139 136 - 145 mmol/L    Potassium 4.0 3.5 - 5.1 mmol/L    Chloride 98 98 - 107 mmol/L    Bicarbonate 24 22 - 29 mmol/L    Anion Gap 17 (H) 7 - 15 mmol/L    Calcium 10.5 8.5 - 10.6 mg/dL    Total Protein 8.4 (H) 6.0 - 8.0 g/dL    Albumin 4.8 3.5 - 5.2 g/dL     Bilirubin, Tot 0.71 <1.20 mg/dL    AST (SGOT) 388 (H) 0 - 40 U/L    ALT (SGPT) 367 (H) 0 - 41 U/L    Alkaline Phos 110 40 - 129 U/L       Assessment: Brett Palmer is a 50 year old male with non-healing scab.    Plan:  1. Neoplasm of uncertain behavior of skin  Highly concerning for skin Haslett (BCC vs SCC) given non-healing scab for almost a year's time. Shave biopsy performed today and sent to pathology.   - Derm Path Tissue Exam    2. Transaminitis  Trend and further management pending labs. If still elevated, will plan for further work-up/investigation.  - Comprehensive Metabolic Panel Green Plasma Separator Tube; Future    3. Morbid obesity due to excess calories (CMS-HCC)  Obesity screening labs ordered.  - Glycosylated Hgb(A1C), Blood Lavender; Future  - Lipid Panel Green Plasma Separator Tube; Future  - Comprehensive Metabolic Panel Green Plasma Separator Tube; Future    4. Influenza vaccine needed  - Flu Vaccine, IM >=3 Years      Return if symptoms worsen or fail to improve.    [name], MD    Patient Instruction:   Barriers to Learning assessed: none. Patient verbalizes understanding of teaching and instructions.

## 2015-09-03 NOTE — Patient Instructions (Signed)
Get your labs done on your way out.

## 2015-09-05 ENCOUNTER — Telehealth (INDEPENDENT_AMBULATORY_CARE_PROVIDER_SITE_OTHER): Payer: Self-pay | Admitting: Family Medicine

## 2015-09-05 DIAGNOSIS — R7401 Elevation of levels of liver transaminase levels: Secondary | ICD-10-CM

## 2015-09-05 DIAGNOSIS — C4491 Basal cell carcinoma of skin, unspecified: Principal | ICD-10-CM

## 2015-09-05 DIAGNOSIS — R740 Nonspecific elevation of levels of transaminase and lactic acid dehydrogenase [LDH]: Secondary | ICD-10-CM

## 2015-09-05 LAB — DERM PATH TISSUE EXAM

## 2015-09-05 NOTE — Telephone Encounter (Signed)
Pt's biopsy results show infiltrative basal cell carcinoma. Pt will likely need Mohs surgery given the location of the lesion on his face and its infiltrative nature.    I have placed a stat referral to derm surg and also spoken to administrative staff at dermatology who is aware of his case.    I attempted to call Pt with results but reached voicemail - told him to call back ASAP to discuss results. I am not in clinic this afternoon but if Pt calls back, please do not relay any of the info above but instead have staff page me so that I can call Pt back to explain pathology and next steps.    I will also try calling Pt again tomorrow and send him a MyChart message to call me.

## 2015-09-06 ENCOUNTER — Telehealth (INDEPENDENT_AMBULATORY_CARE_PROVIDER_SITE_OTHER): Payer: Self-pay | Admitting: Micrographic Dermatologic Surgery

## 2015-09-06 DIAGNOSIS — R7401 Elevation of levels of liver transaminase levels: Secondary | ICD-10-CM | POA: Insufficient documentation

## 2015-09-06 DIAGNOSIS — R740 Nonspecific elevation of levels of transaminase and lactic acid dehydrogenase [LDH]: Secondary | ICD-10-CM

## 2015-09-06 NOTE — Telephone Encounter (Signed)
Attempted call, reached VM again, left message asking Pt to call back and see if staff can pull me out of room if I'm in with a Pt as I would like to talk to him today.

## 2015-09-06 NOTE — Telephone Encounter (Signed)
Pt called back, we discussed his pathology results and implications, likely need for Mohs, all questions answered. Pt given derm surg scheduling number and will call right away.    Also discussed that his liver enzymes are still quite elevated. Pt endorses drinking 2 bottles of wine daily to self-medicate his anxiety. He follows with a psychiatrist at the [place] who has him on 3mg  xanax but refuses to budge on the dose (Pt has not done well with other anxiety meds in the past). I explained to Pt that it is not safe to combine benzos and EtOH, and both are likely contributing to his transaminitis. Pt was planning to quit cold [place], but I advised for safety reasons and to avoid possible withdrawal, that he should taper down over the next couple weeks and then plan to recheck LFTs next month (order placed). If still elevated after weaning down EtOH, will do further labs/work-up. Pt also reminded to get abdominal US scheduled, though this is lower priority than his derm issue.

## 2015-09-06 NOTE — Telephone Encounter (Signed)
Please review patients path and referral "49yo M with non-healing scab of R preauricular space, shave biopsy confirmed infiltrative BCC, suspect need for Mohs" for possible MOHS.   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

## 2015-09-06 NOTE — Telephone Encounter (Signed)
Yes, please schedule for preop ASAP

## 2015-09-13 NOTE — Telephone Encounter (Signed)
Patient was called and scheduled a pre op with Dr.Jiang 09/25/15@ 2:45

## 2015-09-25 ENCOUNTER — Ambulatory Visit (INDEPENDENT_AMBULATORY_CARE_PROVIDER_SITE_OTHER): Payer: Commercial Managed Care - HMO | Admitting: Micrographic Dermatologic Surgery

## 2015-09-25 DIAGNOSIS — C44310 Basal cell carcinoma of skin of unspecified parts of face: Secondary | ICD-10-CM

## 2015-09-25 DIAGNOSIS — C4441 Basal cell carcinoma of skin of scalp and neck: Secondary | ICD-10-CM

## 2015-09-25 DIAGNOSIS — L814 Other melanin hyperpigmentation: Secondary | ICD-10-CM

## 2015-09-25 DIAGNOSIS — L578 Other skin changes due to chronic exposure to nonionizing radiation: Secondary | ICD-10-CM

## 2015-09-25 DIAGNOSIS — C4491 Basal cell carcinoma of skin, unspecified: Secondary | ICD-10-CM

## 2015-09-25 DIAGNOSIS — I781 Nevus, non-neoplastic: Secondary | ICD-10-CM

## 2015-09-25 NOTE — Progress Notes (Signed)
Primary MD: [name]  Consult Requested By: [name]      HISTORY:  Brett Palmer is a 49 year old male who presents for the evaluation and management of:  1.  Infiltrative bcc - R preauricular space (R preauricular scalp)    No itch or pain today.  No change on the lesion since biopsy date.    ICD-10-CM ICD-9-CM   1. Basal cell carcinoma, scalp/neck C44.41 173.41   2. Infiltrative basal cell carcinoma C44.91 173.91       Path #: [id number]    Artificial heart valves/ Pacemaker/Defibrillator: no   Cardiac Transplant or previous history of endocarditis: no   Take antibiotics prior to having procedures: no  Artificial hips, knees, or joints within the last 2 years: no   Currently taking anticoagulants or anti-platelet agents: no  Currently smoking: no    Pain Score (0=no pain 10=extreme pain):      Past Medical History   Diagnosis Date    Gastroesophageal reflux disease     Migraine headache      part of gulf war syndrome    PTSD (post-traumatic stress disorder) 1995-present     [facility] by [facility] Joen Laura at [facility]     SVT (supraventricular tachycardia) (CMS-HCC) 2011    Allergies: Compazine and Latex    Past Surgical History   Procedure Laterality Date    Gallbladder surgery  2011      Family History   Problem Relation Age of Onset    Cancer Father      melanoma, died at age 79      Current Outpatient Prescriptions   Medication Sig    alprazolam (XANAX) 2 MG tablet Take 2 mg by mouth nightly as needed for Insomnia. Pt. Takes 4mg  at night as needed.    fluticasone propionate (FLONASE) 50 MCG/ACT nasal spray Spray 2 sprays into each nostril daily.    [DISCONTINUED] lisinopril (PRINIVIL, ZESTRIL) 10 MG tablet Take 1 tablet by mouth daily.    lisinopril-hydrochlorothiazide (PRINZIDE, ZESTORETIC) 10-12.5 MG tablet Take 1 tablet by mouth daily.    omeprazole (PRILOSEC) 20 MG capsule Take by mouth.    sumatriptan (IMITREX) 100 MG tablet Take 1 tablet by mouth once as needed for Migraine. May repeat in  2 hours in needed    traMADol (ULTRAM) 50 MG tablet Take by mouth.    zolpidem (AMBIEN) 10 MG tablet Take 1 tablet (10 mg) by mouth nightly as needed for Insomnia.     No current facility-administered medications for this visit.       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     Social History Main Topics    Smoking status: Former Smoker     Packs/day: 0.50     Years: 10.00     Types: Cigarettes     Quit date: 01/11/1990    Smokeless tobacco: Not on file    Alcohol use 4.2 - 8.4 oz/week     7 - 14 Standard drinks or equivalent per week      Comment: wine    Drug use: No      Comment: Denies MJ use    Sexual activity: Yes     Partners:  Female     Other Topics Concern    Not on file     Social History Narrative       REVIEW OF SYSTEMS:  Constitutional: wnl   Integument: wnl except as noted below    PHYSICAL EXAM:  Skin Type: 1  General Appearance: within normal limits  Neuro: Alert and oriented x 3  Psych: Mood and affect within normal limits  Lids and conjunctiva: within normal limits  Lips and Oral Mucosa: within normal limits  Head:  within normal limits.  Neck: within normal limits.    Photodamage appreciated in sun-exposed areas.  Numerous tan uniformly pigmented macules in sun-exposed areas, consistent with solar lentigines.  Numerous fine erythematous telangiectasias in sun-exposed areas.    R scalp (anterior to root of superior helix) with atrophic plaque    ASSESSMENT AND TREATMENT PLAN:    1. Infiltrative BCC - R scalp (labeled R preauricular space on biopsy) :Recommend MMS with repair.    A thorough review of the indication for Mohs micrographic surgery with the appropriate reconstructive repair was discussed with the patient including but not limited to location of tumor, size, cure rate, type of skin cancer etc.  The nature of the  procedure, risks, benefits, alternative treatments available were discussed and offered to the patient. Surgical repair of the surgical defect was also discussed which may include but is not limited to any of the following: primary closure, complex or simple repair, tissue-rearrangement or skin-flap, skin-graft, referral to another physician for repair, and secondary intention healing. All of the patient's questions regarding the procedure and after-care were addressed today.      2. Photodamage, Solar Lentigines, and Telangiectasias    Discussed with the patient that these findings are evidence of photodamage. Skin cancer education was provided. The patient was recommended to wear a daily sunscreen (SPF greater than 45) and to practice sun avoidance.     [employer]

## 2015-09-25 NOTE — Progress Notes (Signed)
Attending Note    Subjective:   I reviewed the history.   Patient interviewed and examined.     History of present illness (HPI):   New BCC infiltrative on the R preauricular area (temple/scalp).  No changes on the lesion since the biopsy date.  No pain or itch today.    Review of Systems (ROS):   As per the fellow's note.     Past Medical, Family, Social History:   As per the fellow's note.     Objective:   I have examined the patient and I concur with the fellow's exam. Biopsy scar noted on the above location and confirmed with the patient.  No other suspicious lesions noted around biopsy area.  Sun damaged skin with brown macules and telangiectases were also noted.    Assessment and plan reviewed with the fellow:  BCC infiltrative on the R preauricular area (temple/scalp).  Rec. MMS with repair.  Possibility of requiring a flap or a graft for reconstruction was discussed with the patient.  Dermatoheliosis, lentigines, and telangiectases - Rec. sunprotection daily and TBE qyear.    See the fellow's note for further details.

## 2015-09-27 ENCOUNTER — Ambulatory Visit (INDEPENDENT_AMBULATORY_CARE_PROVIDER_SITE_OTHER): Payer: Commercial Managed Care - HMO | Admitting: Micrographic Dermatologic Surgery

## 2015-09-27 ENCOUNTER — Telehealth (INDEPENDENT_AMBULATORY_CARE_PROVIDER_SITE_OTHER): Payer: Self-pay | Admitting: Dermatology

## 2015-09-27 VITALS — BP 153/77 | HR 92 | Temp 98.8°F

## 2015-09-27 DIAGNOSIS — C4491 Basal cell carcinoma of skin, unspecified: Secondary | ICD-10-CM

## 2015-09-27 DIAGNOSIS — C44319 Basal cell carcinoma of skin of other parts of face: Secondary | ICD-10-CM

## 2015-09-27 DIAGNOSIS — C44212 Basal cell carcinoma of skin of right ear and external auricular canal: Secondary | ICD-10-CM

## 2015-09-27 MED ORDER — TRAMADOL HCL 50 MG OR TABS
50.0000 mg | ORAL_TABLET | Freq: Four times a day (QID) | ORAL | 0 refills | Status: DC | PRN
Start: 2015-09-27 — End: 2017-01-07

## 2015-09-27 MED ORDER — DOXYCYCLINE HYCLATE 100 MG OR TABS
100.00 mg | ORAL_TABLET | Freq: Two times a day (BID) | ORAL | 0 refills | Status: AC
Start: 2015-09-27 — End: 2015-10-04

## 2015-09-27 MED ORDER — TRAMADOL HCL 50 MG OR TABS
50.0000 mg | ORAL_TABLET | Freq: Four times a day (QID) | ORAL | 0 refills | Status: DC | PRN
Start: 2015-09-27 — End: 2015-09-27

## 2015-09-27 NOTE — Progress Notes (Signed)
Primary MD: [name]  Consult Requested By: [place]                                               MOHS MICROGRAPHIC OPERATIVE REPORT    Case #: [id number]    Surgeon:  Hart Carwin, MD  Resident Assistant: J.Reganne Messerschmidt         Discharge: J.Carlson  Preoperative diagnosis:     ICD-10-CM ICD-9-CM   1. Infiltrative basal cell carcinoma C44.91 173.91   2. 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 Basal cell carcinoma of right cheek C44.319 173.31      Postoperative diagnosis:  same  Preoperative Size:: 14x3mm  Lesion Location: Right Preauricular ([id number]) (involving right ear and right cheek)  Indication:  Meets Appropriate Use Criteria (AUC) defined by the American Academy of Dermatology (AAD); Area H;Aggressive histopathology and location    Written and verbal, witnessed informed consent was obtained.  Time Out: performed at 1:45 PM. Correct patient: Yes  Correct site: Yes (Pt confirmed location)    ANESTHESIA:: 7 ml of 0.5% lidocaine with epinephrine 1:200,000 was infiltrated as a field block during the procedure.    Operative Details:  The clinical outlines of the tumor were marked and local anesthesia infiltrated.  The surgical site was prepped with hibiclens and draped with sterile towels.  The lesion was removed by Mohs surgery, fresh-tissue technique. At the end of each stage of surgery, hemostasis was obtained with light electrocoagulation and a temporary bandage was placed.  Additional stages were performed in the same manner as the first stage until the surgical margin sections were tumor free.    Each tissue removed from the lesion site was examined by frozen section technique with detailed mappings of the tissue, and with Dr. Rockney Ghee reading the slides in the manner of Mohs micrographic or microscopically controlled surgery.  At the completion of the surgery, all sections (blocks) in the final stage (Final stage: III) were free of tumor.The total number  of stages required in this Mohs surgery was: The total number of stages required in this Mohs surgery was: 3.  The number of histological sections (blocks) per stage is as follows:    Stage I  4, Stage II 3, Stage III 1    Histologic Description of First Stage: Initial and deeper sections of this specimen were cut and examined to reveal nests of atypical basophilic cells present within the fibrotic dermis and display an infiltrative pattern of invasion. Also noted were nests of atypical basophilic cells present right below or budding off the epidermis with skip areas.  Each subsequent stage demonstrated histopathology similar to the first stage unless otherwise noted.    A total of 0 additional blocks of tissue in all stages were processed in addition to the five blocks allowed per stage.     The final post-operative size measured was: The final post-operative size measured was: 28x29mm.  Estimated blood loss less than 52ml. After all margins were free of tumor, the repair options were discussed.  If repair was not performed immediately after Mohs Surgery, the wound was bandaged with a pressure dressing.  Written and verbal wound care instructions were given.  The patient will return in 7 days.    Additional Information:   Discharge time: 1630  [facility]  OPERATIVE REPORT    Surgeon: Hart Carwin  Resident Assistant: J.Josiephine Simao  Case #: [id number]    PREOPERATIVE DIAGNOSIS:    ICD-10-CM ICD-9-CM   1. Infiltrative basal cell carcinoma C44.91 173.91   2. 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 Basal cell carcinoma of right cheek C44.319 173.31     POSTOPERATIVE DIAGNOSIS: Same  OPERATIVE PROCEDURE: Repair by full thickness skin graft  Recipient site: Right Preauricular   SIZE OF DEFECT: : 28x26mm  Graft size (cm2): 1.89 cm2    Written and verbal, witnessed informed consent was obtained.  Time Out: performed at 3:30 PM. Correct patient: Yes  Correct site: Yes (Pt  confirmed location)     ANESTHESIA: 16 ml of 0.5% lidocaine with 1:200,000 epinephrine.    DURATION OF PROCEDURE: 86min  DURATION OF OBSERVATION: 83min    INDICATION:  All other repair options were considered including secondary intention healing, linear closure, and adjacent tissue transfer.  Because of the size, depth, and location of the defect, it was decided that a full thickness skin graft was the best reconstruction option at this time.    DESCRIPTION OF PROCEDURE:  The patient was brought back to the operating room, reanesthetized, reprepped and redraped.  Donor site: Adjacent Tissue.  The size of the donor skin to be harvested was measured, then the area was prepared with Hibiclens, cleaned, and draped.  It was anesthetized locally and an ellipse was harvested.  Wound edges were widely undermined.  Hemostasis was achieved with electrocoagulation.  Repair of the donor site was accomplished with subcutaneous sutures as follows:  Deep suture gauge: 4-0  Deep suture material: Monocryl  Epidermal edge closure was accomplished with superficial sutures as follows:  Superficial suture gauge: 6-0  Superficial suture material: Prolene  Superficial suture type: Simple running (Simple Interrupted)  The full thickness skin graft was thinned and then draped over the defect.  It was stabilized with several simple interrupted sutures at the superior edge and then excessive donor skin was trimmed.   One basting stitch was placed in the center of the graft.  The remainder of the graft edge was attached to the defect edge using  Sutures as follows:  Suture gauge: 5-0  Suture material: Fast absorbing gut   Rolled xeroform gauze was used as a dressing, then it was covered by a telfa dressing and secured with tape.    The donor site was cleaned with sterile saline, dried off, Petrolatum ointment was applied, and the wound was covered.  A pressure dressing was applied.  The patient tolerated the procedure well without any  complications.  The patient was given detailed oral and written instructions on postoperative care.  The patient was asked to return to the unit for suture removal in 7 days.    The patient left the office in good medical condition.     [facility]  OPERATIVE REPORT    Surgeon: Hart Carwin, MD    Resident Assistant: J.Javayah Magaw  Discharge: J.Carlson  Case #: [id number]    PREOPERATIVE DIAGNOSIS:    ICD-10-CM ICD-9-CM   1. Infiltrative basal cell carcinoma C44.91 173.91   2. 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 Basal cell carcinoma of right cheek C44.319 173.31       OPERATIVE PROCEDURE: Type of flap: Advancement Flap,          LOCATION:: Right Preauricular  SIZE OF DEFECT: : 28x4mm  SIZE OF FLAP:: 16.04 cm2    Written and verbal, witnessed informed consent was obtained.  Time Out: performed at 3:30 PM. Correct patient: Yes  Correct site: Yes (Pt confirmed location)     ANESTHESIA:: 7 ml of 0.5% lidocaine with 1:200,000 epinephrine    DURATION OF PROCEDURE: 45 min.   POSTOPERATIVE OBSERVATION: 15 min.    INDICATION:  Other repair options were considered including secondary intention healing, linear closure, and grafting.  Because of the size, depth, and location of the defect, it was decided that an adjacent tissue transfer repair is the most ideal reconstruction option at this time.    DESCRIPTION OF PROCEDURE:  The patient was brought back into the operating room, reanesthetized, reprepped and redraped.  The edges of the wound were debeveled.  The wound was undermined.  A Flap was designed as follows:  Type of flap: Advancement Flap,    It was incised with a scalpel and elevated with blunt tipped scissors. Hemostasis was achieved with electrocoagulation.  The wound was closed with subcutaneous sutures as follows:  Deep suture gauge: 4-0   Deep suture material: Monocryl.    Epidermal edge closure was accomplished with superficial sutures as follows: Superficial  suture gauge: 6-0   Superficial suture material: Prolene   Superficial suture type: Simple running (Simple Interrupted)    The patient tolerated the procedure well without any complications.    The wound was cleaned with sterile saline, dried off, Petrolatum ointment was applied, and the wound was covered.  A dressing was applied for stabilization and light pressure.  The patient was given detailed oral and written instructions on postoperative care.  The patient left in good medical condition.    The patient was advised to return in 7 days for suture removal.    Patient was instructed to take Doxycycline 100mg  two times per day for one week. Risk of GI upset discussed.  Rx tramadol 50mg  PO q 6 hours as needed for moderate pain.  Dispense 20. No refills.    DISCHARGE SUMMARY:    Discharge time: 1630. Accompanied by: Self.   Verbal and Written Post-Op Instructions Given: yes    Dollene Cleveland, MD  Micrographic Surgery and Dermatologic Oncology Fellow

## 2015-09-27 NOTE — Patient Instructions (Signed)
[facility]    WOUND CARE AFTER SKIN GRAFTING:  . The bandage placed on the skin graft at the time of surgery should be kept dry and intact for 5-7days.  Do not remove or change the bandage during this time unless instructed to do otherwise.  o DO NOT GET YOUR BANDAGE WET DURING THIS WEEK.  o After 5-7 days, you will return to the clinic for a suture removal or dressing change and post-op check of the skin graft.  . Your skin graft is extremely fragile.  It is extremely important not to disturb the graft or bandage, as a delicate blood supply is starting to form between the graft and your body during the first two weeks.   o The new blood vessels are tiny and very fragile. If the graft is injured from friction, strenuous exercise or activity, or from changes in the bloodstream caused by smoking, it may not survive.  . After your bandage is removed, do not be surprised about the discoloration and crusting on the graft. This will improve.  o The skin graft will look very purple and black when you return in 5-7 days to get the bandage removed. This is normal, and the discoloration will decrease in intensity over the next 3-6 months.   . If your bandage comes off, apply a thick layer of Aquaphor/Petrolatum Ointment to the Graft Site and keep covered. Call our office for further instruction.    WHAT TO EXPECT AFTER MOHS SURGERY:  . A pressure dressing has been applied to your wound to prevent bleeding and minimize swelling. Donor site bandage may be removed in 48 hours.  o Do not get the pressure dressing wet.  o Apply an ice pack around your dressing for 20 minutes every hour until bedtime to help reduce swelling and discomfort.    o A small amount of blood on the edges of the dressing is normal.  o If bleeding persists and soils the dressing, apply firm and direct pressure over the dressing using clean gauze for 15 minutes.  Do not periodically "peek" at wound, but rather hold constant pressure for 15  minutes.  o If the bleeding persists, repeat the pressure for an additional 15 minutes.  o If a large, swollen, purple area is noted around the surgery site that appears as if it may be full of blood (a hematoma), please call our clinic.  o In rare instances, if bleeding continues, call our office at [phone] or call operator to page Dermatology on call Resident.  . If your surgical area is on the head, sleep with your head elevated.  This helps to keep swelling down.  . If your surgical area is near the eye or forehead, you may experience swelling and get a black eye.  This is normal, the swelling will get worse the first 48 hours after procedure.   . Discomfort is usually minimal.  o Tylenol 500-650mg every 6 hours and Ibuprofen 400-600mg every 6 hours is usually enough for post-operative discomfort.   o Do not start taking non-prescribed aspirin containing medications for 2 days after your surgery.  o Avoid alcohol and smoking.  o Resting and elevating the wound area are encouraged.    DRESSING CHANGES FOR YOUR DONOR SITE:  TWICE DAILY DRESSING CHANGES RECOMMENDED  . Remove donor site dressing or bandage 48 hours following your procedure.  . Wash hands  . Clean wound using diluted vinegar solution, see next page for  (DO NOT   USE HYDROGEN PEROXIDE). This cleaning can be done by using a Q-tip or clean gauze.  Gently remove any dried blood or excess crusting; and then rinse well.  . Apply a coating of petrolatum ointment/Aquaphor or Vaseline (DO NOT APPLY NEOSPORIN, POLYSPORIN, BACITRACIN OR ANTIBIOTIC OINTMENT). Keep ointment on the wound at all times until the wound is completely healed.  . Cover wound with a non-stick dressing (example: Telfa) and tape until suture removal.  . Adequate cleaning will prevent a thick scab from forming.  This is important since a scab is destructive to good wound healing and may lead to a more noticeable scar.  . Allow any scab to fall off on its own. Do not pick at  scabs.    WHAT TO EXPECT DURING THE HEALING PROCESS:  . Avoid exercise after surgery to optimize healing.  o Limit aerobic activity, walking, stair climbing, bending, and lifting.  o Exercise may be resumed after sutures are removed as tolerated.  . Avoid soaking in the tub, hot tubs, and swimming pools until fully healed.  . Normal wound healing changes:  o The wound edges may be pink and slightly tender to touch.  o Expect mild itching, discomfort, numbness, or bruising around the wound.  . If the wound becomes bright red, swollen, hot, acutely painful to touch, or has increased drainage, call our office immediately.  These are signs of infection and may require antibiotics for resolution.    **SPECIAL WOUND CARE INSTRUCTIONS:**        **ONLY USE INSTRUCTIONS IF INSTRUCTED BY PROVIDER**  . If you have dissolving sutures, gently cleanse your surgery site with mild soap and water, pat dry the area and apply Aquaphor or Vaseline and cover with non-stick pad.     o If dissolving sutures is on face wait one week or if on the trunk of the body wait two weeks before removing remaining sutures.  o Use a warm wet wash cloth and gently rub in a circular motion to remove the remaining sutures.    DILUTED VINEGAR SOLUTION:  . Wound care instructions for open wounds using dilute vinegar solution:  o How to prepare vinegar solution:  . Mix 1 tablespoon of white household vinegar with 1 cups of water  . Store in the refrigerator and use for up to 5 days  o Soak gauze with vinegar solution and apply to wound for 5 minutes  o Remove wet gauze  o Clean wound and apply dressing as instructed on the wound care instruction sheet    INSTURUCTIONS FOR ALL SKIN CANCER SURGERY AND CONTACT NUMBERS:  . If you feel you have an emergency, please call [phone] and ask for Zoe or Glee.  . After hours/weekends/holidays, please call [phone] and ask for the on call Dermatology Resident.  . If necessary, you may go to the emergency room or  contact your primary care physician.  . Thank you for allowing us the opportunity to participate in your health care.

## 2015-09-27 NOTE — Progress Notes (Signed)
Attending Note:   I was physically present during all the key parts of the Mohs Surgery procedure.  I have reviewed all diagnostic slides and have edited the gross and/or microscopic portion of this report as part of my pathologic assessment and final diagnosis.  I reviewed and agree with the operative/pathology report as per fellow's note.    Electronically signed by Quenton Fetter, MD -- 09/27/2015 7:39 PM    Attending Note:   I was physically present during all the key parts of the reconstruction procedure.      I reviewed and agree with the operative report as per fellow's note.

## 2015-09-27 NOTE — Telephone Encounter (Signed)
The patient was contacted at the number on record following the surgical procedure. No answer.  A detailed voicemail was left for the patient.     Should any unexpected issues arise, the patient was asked to contact our office at [phone]. In the event of an emergency, they were advised to page the dermatology resident on call.      Maryln Eastham, MD  Micrographic Surgery and Dermatologic Oncology Fellow

## 2015-09-28 ENCOUNTER — Telehealth (INDEPENDENT_AMBULATORY_CARE_PROVIDER_SITE_OTHER): Payer: Self-pay | Admitting: Family Medicine

## 2015-09-28 NOTE — Telephone Encounter (Signed)
Patient calling to check status of previous encounter  Per patient has picked up both medications but states that the Tramadol is not working for his pain  Patient is requesting a call from Dr Salvadore Dom as he did decline a appt for a stronger medication  Please advise

## 2015-09-28 NOTE — Telephone Encounter (Signed)
Pt requesting status on request below.    Requests call back    Please advise

## 2015-09-28 NOTE — Telephone Encounter (Signed)
Pt requesting Rx for pain for 3-4 days, pt states he had R ear surgery yesterday and was prescribed traMADol (ULTRAM) 50 MG tablet.  Pt states he is still in pain, and Dr Salvadore Dom is aware of procedure.  Offered pt appt, pt declined    Requests call back     Pharmacy- [id number], [place]

## 2015-10-01 ENCOUNTER — Ambulatory Visit
Admission: RE | Admit: 2015-10-01 | Discharge: 2015-10-01 | Disposition: A | Discharge status: Home / Self Care | Payer: Commercial Managed Care - HMO | Source: Ambulatory Visit | Attending: Diagnostic Radiology | Admitting: Diagnostic Radiology

## 2015-10-01 DIAGNOSIS — R740 Nonspecific elevation of levels of transaminase and lactic acid dehydrogenase [LDH]: Secondary | ICD-10-CM

## 2015-10-01 DIAGNOSIS — R161 Splenomegaly, not elsewhere classified: Secondary | ICD-10-CM | POA: Insufficient documentation

## 2015-10-01 DIAGNOSIS — K760 Fatty (change of) liver, not elsewhere classified: Principal | ICD-10-CM | POA: Insufficient documentation

## 2015-10-01 DIAGNOSIS — R7401 Elevation of levels of liver transaminase levels: Secondary | ICD-10-CM

## 2015-10-02 NOTE — Telephone Encounter (Signed)
Routing to Pt's derm surgeon to manage pain meds in peri-operative period.

## 2015-10-02 NOTE — Telephone Encounter (Signed)
Routing message to PCP for review

## 2015-10-03 NOTE — Telephone Encounter (Signed)
Called patient, no answer. Voicemail left.  Patient to call back during office hours, will offer follow-up visit for reevaluation.

## 2015-10-04 ENCOUNTER — Ambulatory Visit (INDEPENDENT_AMBULATORY_CARE_PROVIDER_SITE_OTHER): Payer: Commercial Managed Care - HMO | Admitting: Micrographic Dermatologic Surgery

## 2015-10-04 ENCOUNTER — Encounter (INDEPENDENT_AMBULATORY_CARE_PROVIDER_SITE_OTHER): Payer: Self-pay | Admitting: Micrographic Dermatologic Surgery

## 2015-10-04 DIAGNOSIS — Z09 Encounter for follow-up examination after completed treatment for conditions other than malignant neoplasm: Secondary | ICD-10-CM

## 2015-10-04 NOTE — Progress Notes (Signed)
Follow-up for Brett Palmer status post Mohs micrographic surgery with the appropriate reconstructive repair for infiltrative BCC R ear and R cheek - with FTSG and advancement flap repair on 09/27/15. Patient has no complaints.    PHYSICAL EXAM: Well healing surgical scar with graft intact.  No evidence of infection.    ASSESSMENT/PLAN:  Well healed surgical scar.  Suture removal today.  Advised patient to continue applying topical hydrophillic petrolatum for another week to flap.  Continue daily gentle wound care to graft site for 2-3 weeks.    Patient advised to protect the surgical site from sun exposure.  Patient will follow up with general dermatology in 6 months and in 4 months for reevaluation.    Dollene Cleveland, MD  Micrographic Surgery and Dermatologic Oncology Fellow

## 2015-10-04 NOTE — Telephone Encounter (Signed)
Patient seen today 10/04/2015 for suture removal with Dr. Rockney Ghee.  Patient states the area is feeling much better and has no pain.

## 2015-11-06 ENCOUNTER — Encounter (INDEPENDENT_AMBULATORY_CARE_PROVIDER_SITE_OTHER): Payer: Self-pay | Admitting: Family Medicine

## 2015-12-21 ENCOUNTER — Telehealth (HOSPITAL_COMMUNITY): Payer: Self-pay | Admitting: Cardiology

## 2015-12-21 DIAGNOSIS — I471 Supraventricular tachycardia: Secondary | ICD-10-CM

## 2015-12-21 DIAGNOSIS — R002 Palpitations: Principal | ICD-10-CM

## 2015-12-21 MED ORDER — METOPROLOL TARTRATE 25 MG OR TABS
25.0000 mg | ORAL_TABLET | Freq: Two times a day (BID) | ORAL | 3 refills | Status: DC
Start: 2015-12-21 — End: 2015-12-21

## 2015-12-21 MED ORDER — METOPROLOL TARTRATE 25 MG OR TABS
25.0000 mg | ORAL_TABLET | Freq: Two times a day (BID) | ORAL | 0 refills | Status: DC
Start: 2015-12-21 — End: 2016-12-09

## 2015-12-21 NOTE — Addendum Note (Signed)
Addended by: Caryl Pina, Jake Samples on: 12/21/2015 05:08 PM     Modules accepted: Orders

## 2015-12-21 NOTE — Telephone Encounter (Signed)
Pt is calling stating that he is having a rapid heart rate of 135 BPM. Pt wakes up with the rapid heart rate and SOB. This happens in the middle of the night and is awaken by the bothersome symptomsfrom it. He then relaxed in bed until the symptoms go away. Please call to advise.

## 2015-12-21 NOTE — Telephone Encounter (Signed)
Routed for advise to Dr. Emeline Gins EP Fellow on call. Paged physician. Stated will call patient.

## 2015-12-21 NOTE — Telephone Encounter (Signed)
Called patient  Patient reports that he had an ablation 03/2015  Reports symptoms in the last 3 weeks  Waking up with rapid HR and SOB that lasts until the afternoon  Denies chest pain or dizziness  "Flutter feeling in my chest with shortness of breath where I need to pause when talking"  Pulse 120-135 even while resting  Would like metoprolol  Reports he was on 25 mg daily previously  [facility] [address] [place]    Medications:  No cardiac medication  Xanax  Omeprazole  Stopped taking imitrex, because he would go into SVT    ER/911 precautions reviewed.  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    Routed to Dr. Rigoberto Noel to advise

## 2015-12-21 NOTE — Telephone Encounter (Addendum)
Phoned patient to relay NP Martinez's recommendations: ordered Metoprolol and Event monitor.  Patient stated he had an event monitor before and is familiar with it. All questions answered.  Advised patient to follow up with the clinic to discuss further recommendation by Dr. Rigoberto Noel  after event monitor has been resulted.

## 2015-12-24 ENCOUNTER — Telehealth (HOSPITAL_COMMUNITY): Payer: Self-pay | Admitting: Cardiology

## 2015-12-24 NOTE — Telephone Encounter (Signed)
Zio patch ordered faxed to [facility]. For more information on delivery status contact [phone].

## 2016-01-22 ENCOUNTER — Ambulatory Visit
Admit: 2016-01-22 | Discharge: 2016-01-22 | Payer: Commercial Managed Care - HMO | Attending: Adult Health | Admitting: Adult Health

## 2016-01-22 DIAGNOSIS — R002 Palpitations: Principal | ICD-10-CM

## 2016-01-29 ENCOUNTER — Encounter (HOSPITAL_COMMUNITY): Payer: Self-pay | Admitting: Adult Health

## 2016-05-29 ENCOUNTER — Ambulatory Visit (INDEPENDENT_AMBULATORY_CARE_PROVIDER_SITE_OTHER): Payer: Commercial Managed Care - HMO | Admitting: Family Medicine

## 2016-05-29 ENCOUNTER — Encounter (INDEPENDENT_AMBULATORY_CARE_PROVIDER_SITE_OTHER): Payer: Self-pay | Admitting: Family Medicine

## 2016-05-29 VITALS — BP 133/77 | HR 106 | Temp 98.4°F | Resp 14 | Ht 72.0 in | Wt 248.0 lb

## 2016-05-29 DIAGNOSIS — J309 Allergic rhinitis, unspecified: Secondary | ICD-10-CM

## 2016-05-29 DIAGNOSIS — H8102 Meniere's disease, left ear: Principal | ICD-10-CM

## 2016-05-29 MED ORDER — PROMETHAZINE HCL 25 MG OR TABS
25.0000 mg | ORAL_TABLET | Freq: Four times a day (QID) | ORAL | 1 refills | Status: DC | PRN
Start: 2016-05-29 — End: 2017-01-07

## 2016-05-29 MED ORDER — FLUTICASONE PROPIONATE 50 MCG/ACT NA SUSP
2.0000 | Freq: Every day | NASAL | 11 refills | Status: DC
Start: 2016-05-29 — End: 2016-11-13

## 2016-05-29 NOTE — Progress Notes (Signed)
Family Medicine Clinic Progress Note    CC:   Chief Complaint   Patient presents with   . Other     ear problems         Subjective: Brett Palmer is a 50 year old male who is here for CC above.    Has what he believes is a recurrence of his Meniere's.  Was diagnosed back in L ear ~1995 on MRI by ENT Dr. Nikki Dom, just told symptomatic management ([facility]). Hasn't had flare in years.  Has 70% hearing loss in L ear, feels it is getting worse.  Noticing increased fullness in both ears  General fatigue  Had bad vertigo yesterday, progressed to N/V.  Seems that it occurred after the Somers  Not currently having vertigo or nausea  Certain Epley's maneuvers help    Patient Active Problem List   Diagnosis   . Hypertensive disorder   . Anxiety   . Palpitations   . ETOH abuse   . PTSD (post-traumatic stress disorder)   . Transaminitis     Outpatient Medications Prior to Visit   Medication Sig Dispense Refill   . alprazolam (XANAX) 2 MG tablet Take 2 mg by mouth nightly as needed for Insomnia. Pt. Takes 4mg  at night as needed.     . fluticasone propionate (FLONASE) 50 MCG/ACT nasal spray Spray 2 sprays into each nostril daily. 1 bottle 11   . lisinopril (PRINIVIL, ZESTRIL) 10 MG tablet Take 1 tablet by mouth daily. 30 tablet 5   . lisinopril-hydrochlorothiazide (PRINZIDE, ZESTORETIC) 10-12.5 MG tablet Take 1 tablet by mouth daily. 30 tablet 2   . metoprolol tartrate (LOPRESSOR) 25 MG tablet Take 1 tablet (25 mg) by mouth 2 times daily. 60 tablet 0   . omeprazole (PRILOSEC) 20 MG capsule Take by mouth.     . sumatriptan (IMITREX) 100 MG tablet Take 1 tablet by mouth once as needed for Migraine. May repeat in 2 hours in needed 10 tablet 5   . traMADol (ULTRAM) 50 MG tablet Take 1 tablet (50 mg) by mouth every 6 hours as needed for Moderate Pain (Pain Score 4-6). 20 tablet 0   . traMADol (ULTRAM) 50 MG tablet Take by mouth.     . zolpidem (AMBIEN) 10 MG tablet Take 1 tablet (10 mg) by mouth nightly as needed for  Insomnia. 30 tablet 0     No facility-administered medications prior to visit.      Immunization History   Administered Date(s) Administered   . Influenza Vaccine >=3 Years 10/04/2012, 11/06/2014, 09/03/2015   . Pneumococcal 23 Vaccine (PNEUMOVAX-23) 11/06/2014     Allergies   Allergen Reactions   . Compazine Anxiety   . Latex Rash       Objective:  Vitals:    05/29/16 1610   BP: 133/77   BP cuff site: Left;Upper;Arm   BP Patient Position: Sitting   BP cuff size: Regular   Pulse: 106   Resp: 14   Temp: 98.4 F (36.9 C)   TempSrc: Oral   SpO2: 98%   Weight: 112.5 kg (248 lb)   Height: 6' (1.829 m)     Estimated body mass index is 33.63 kg/(m^2) as calculated from the following:    Height as of this encounter: 6' (1.829 m).    Weight as of this encounter: 112.5 kg (248 lb).    Physical Examination  General Appearance: healthy, alert, no distress, pleasant affect, cooperative.  Eyes:  No nystagmus.  Ears: TMs/canals clear bilaterally    Assessment: Brett Palmer is a 50 year old male    Plan:  1. Meniere's disease, left  - promethazine (PHENERGAN) 25 MG tablet; Take 1 tablet (25 mg) by mouth every 6 hours as needed for Nausea.  Dispense: 30 tablet; Refill: 1  - ENT Clinic    2. Allergic rhinitis, unspecified allergic rhinitis trigger, unspecified rhinitis seasonality  - fluticasone propionate (FLONASE) 50 MCG/ACT nasal spray; Spray 2 sprays into each nostril daily.  Dispense: 1 bottle; Refill: 11      Follow up PRN    [name], MD    Patient Instruction:   Medications reviewed with patient and medication list reconciled.   Over the counter medications, herbal therapies and supplements reviewed.   Patient's understanding and response to medications assessed.   Barriers to medications assessed and addressed.   Risks, benefits, alternatives to medications reviewed.   Barriers to Learning assessed: none. Patient verbalizes understanding of teaching and instructions.

## 2016-06-25 ENCOUNTER — Encounter (INDEPENDENT_AMBULATORY_CARE_PROVIDER_SITE_OTHER): Payer: Self-pay | Admitting: Family Medicine

## 2016-06-25 DIAGNOSIS — Z111 Encounter for screening for respiratory tuberculosis: Principal | ICD-10-CM

## 2016-06-25 NOTE — Telephone Encounter (Signed)
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 Can I get that from you?

## 2016-06-25 NOTE — Telephone Encounter (Signed)
Teed up order and routed message  to PCP to authorized.

## 2016-06-27 ENCOUNTER — Encounter (INDEPENDENT_AMBULATORY_CARE_PROVIDER_SITE_OTHER): Payer: Self-pay | Admitting: Family Medicine

## 2016-08-10 ENCOUNTER — Encounter (INDEPENDENT_AMBULATORY_CARE_PROVIDER_SITE_OTHER): Payer: Self-pay | Admitting: Family Medicine

## 2016-08-10 DIAGNOSIS — F419 Anxiety disorder, unspecified: Principal | ICD-10-CM

## 2016-08-11 ENCOUNTER — Telehealth (INDEPENDENT_AMBULATORY_CARE_PROVIDER_SITE_OTHER): Payer: Self-pay | Admitting: Family Medicine

## 2016-08-11 ENCOUNTER — Encounter (INDEPENDENT_AMBULATORY_CARE_PROVIDER_SITE_OTHER): Payer: Self-pay | Admitting: Family Medicine

## 2016-08-11 MED ORDER — ALPRAZOLAM 1 MG OR TABS
1.0000 mg | ORAL_TABLET | Freq: Three times a day (TID) | ORAL | 0 refills | Status: DC | PRN
Start: 2016-08-11 — End: 2019-04-10

## 2016-08-11 NOTE — Telephone Encounter (Signed)
Message routed to Dr. Celebi to give advise.

## 2016-08-11 NOTE — Telephone Encounter (Signed)
From: Jackalyn Lombard  To: [name], MD  Sent: 08/10/2016 11:25 AM PDT  Subject: 20-Other    Hi Dr. Salvadore Dom,    I have an urgent request. My mother passed away this past month and I had a prescription for Xanax through the [place], which I left half of it in my mother's house, which was cleaned out. I have run out and re-ordered a refill through the [place] but it won't arrive until Friday or Saturday next week. (snail mail) I am asking if you could prescribe me a small amount to get through the week. I take 3 mg per day in the evening. I can send you a pic of my last bottle. I have 3 refills remaining. It is on my medical record that I take Xanax and I cannot go cold [place]. [facility] because my doctor at the [facility] [place] quit and they have not assigned me a new doctor yet. It is difficult to get to one in such a short period of time. If this is possible I would greatly appreciate it.    Regards,    Brett Palmer

## 2016-08-12 NOTE — Telephone Encounter (Signed)
Addressed in other encounter 

## 2016-11-04 ENCOUNTER — Encounter (INDEPENDENT_AMBULATORY_CARE_PROVIDER_SITE_OTHER): Payer: Self-pay | Admitting: Family Medicine

## 2016-11-04 NOTE — Telephone Encounter (Signed)
From: Jackalyn Lombard  To: [name], MD  Sent: 11/04/2016 6:32 AM PST  Subject: 20-Other    Good Morning Dr. Salvadore Dom,    I have a terrible middle ear thing again with menears. I am dizzy and nauseous and was unable to drive to work today. Is it possible to get sick note? I need one for my new work. I have seen you in the past for this same thing. I did explain to my work that it is a chronic thing.    I am taking meclizine and taking it easy.     Thank you for your help.    Regards, Brett Palmer

## 2016-11-07 NOTE — Telephone Encounter (Signed)
RN sent MyChart message.

## 2016-11-10 ENCOUNTER — Other Ambulatory Visit (INDEPENDENT_AMBULATORY_CARE_PROVIDER_SITE_OTHER): Payer: Self-pay | Admitting: Family Medicine

## 2016-11-10 DIAGNOSIS — I10 Essential (primary) hypertension: Principal | ICD-10-CM

## 2016-11-10 NOTE — Telephone Encounter (Signed)
From: Brett Palmer  To: [name], MD  Sent: 11/10/2016 2:01 PM PST  Subject: Medication Renewal Request    Original authorizing provider: [name], MD    Brett Palmer would like a refill of the following medications:  lisinopril-hydrochlorothiazide (PRINZIDE, ZESTORETIC) 10-12.5 MG tablet [name], MD]  fluticasone propionate (FLONASE) 50 MCG/ACT nasal spray [name], MD]    Preferred pharmacy: [facility] [id number] - [address], [place] AT [place]:

## 2016-11-11 NOTE — Telephone Encounter (Signed)
Patient is requesting refill of medication, please see orders for preloaded prescription refill of.    Last visit with this physician:    05/29/2016  Next appointment with this physician:   Visit date not found    Last visit with this office:     05/29/2016  Next appointment with this office:    Visit date not found    List of HM items due or overdue:  Health Maintenance Due   Topic Date Due   . COLON CANCER SCREENING WITH COLONOSCOPY  01/24/2016   . INFLUENZA VACCINE  07/01/2016       Last 3 blood pressures:  Blood Pressure   05/29/16 133/77   09/27/15 153/77   09/03/15 134/84       Last basic metabolic panel:  Lab Results   Component Value Date    NA 141 09/03/2015    K 3.9 09/03/2015    CL 99 09/03/2015    BICARB 26 09/03/2015    BUN 6 09/03/2015    CREAT 0.79 09/03/2015    GLU 94 09/03/2015    [place] 9.4 09/03/2015       Last urinalysis:  No results found for: COLORUA, APPEARUA, [place], BILIUA, KETONEUA, [place], [place], [place], [place], UROBILUA, NITRITEUA, LEUKESTUA, [place], RBCUA, [place], [place], CRYSTALSUA, COMMENTSUA

## 2016-11-13 ENCOUNTER — Other Ambulatory Visit (INDEPENDENT_AMBULATORY_CARE_PROVIDER_SITE_OTHER): Payer: Self-pay | Admitting: Family Medicine

## 2016-11-13 DIAGNOSIS — J309 Allergic rhinitis, unspecified: Principal | ICD-10-CM

## 2016-11-13 NOTE — Telephone Encounter (Signed)
Patient is requesting refill of medication, please see orders for preloaded prescription refill of fluticasone propionate (FLONASE) 50 MCG/ACT nasal spray.      Last filled:                                      05/29/2016  Last visit with this physician:    05/29/2016  Next appointment with this physician:   Visit date not found    Last visit with this office:     05/29/2016  Next appointment with this office:    Visit date not found    HM Items that are overdue or due soon include:  Health Maintenance Due   Topic Date Due   . COLON CANCER SCREENING WITH COLONOSCOPY  01/24/2016   . INFLUENZA VACCINE  07/01/2016

## 2016-11-15 ENCOUNTER — Other Ambulatory Visit (INDEPENDENT_AMBULATORY_CARE_PROVIDER_SITE_OTHER): Payer: Self-pay | Admitting: Family Medicine

## 2016-11-15 DIAGNOSIS — I10 Essential (primary) hypertension: Principal | ICD-10-CM

## 2016-11-17 MED ORDER — LISINOPRIL-HYDROCHLOROTHIAZIDE 10-12.5 MG OR TABS
1.0000 | ORAL_TABLET | Freq: Every day | ORAL | 0 refills | Status: DC
Start: 2016-11-17 — End: 2017-01-07

## 2016-11-17 MED ORDER — FLUTICASONE PROPIONATE 50 MCG/ACT NA SUSP
2.0000 | Freq: Every day | NASAL | 11 refills | Status: DC
Start: 2016-11-17 — End: 2017-08-23

## 2016-11-17 NOTE — Telephone Encounter (Signed)
Patient is requesting refill of medication, please see orders for preloaded prescription refill of.    Last visit with this physician:    05/29/2016  Next appointment with this physician:   Visit date not found    Last visit with this office:     05/29/2016  Next appointment with this office:    Visit date not found    List of HM items due or overdue:  Health Maintenance Due   Topic Date Due   . COLON CANCER SCREENING WITH COLONOSCOPY  01/24/2016   . INFLUENZA VACCINE  07/01/2016       Last 3 blood pressures:  Blood Pressure   05/29/16 133/77   09/27/15 153/77   09/03/15 134/84       Last basic metabolic panel:  Lab Results   Component Value Date    NA 141 09/03/2015    K 3.9 09/03/2015    CL 99 09/03/2015    BICARB 26 09/03/2015    BUN 6 09/03/2015    CREAT 0.79 09/03/2015    GLU 94 09/03/2015    [place] 9.4 09/03/2015       Last urinalysis:  No results found for: COLORUA, APPEARUA, [place], BILIUA, KETONEUA, [place], [place], [place], [place], UROBILUA, NITRITEUA, LEUKESTUA, [place], RBCUA, [place], [place], CRYSTALSUA, COMMENTSUA

## 2016-11-17 NOTE — Telephone Encounter (Signed)
From: Jackalyn Lombard  To: [name], MD  Sent: 11/15/2016 2:12 PM PST  Subject: Medication Renewal Request    Original authorizing provider: [name], MD    Jackalyn Lombard would like a refill of the following medications:  lisinopril-hydrochlorothiazide (PRINZIDE, ZESTORETIC) 10-12.5 MG tablet [name], MD]    Preferred pharmacy: [facility] [id number] - [address], [place] AT [place]:

## 2016-11-17 NOTE — Telephone Encounter (Signed)
Rx authorzied but Pt due for labs and BP f/u, please schedule.

## 2016-11-18 ENCOUNTER — Other Ambulatory Visit (INDEPENDENT_AMBULATORY_CARE_PROVIDER_SITE_OTHER): Payer: Self-pay | Admitting: Family Medicine

## 2016-11-18 NOTE — Telephone Encounter (Signed)
Patient notified at mychart.

## 2016-11-18 NOTE — Telephone Encounter (Signed)
Duplicated - med already refilled.

## 2016-11-19 NOTE — Telephone Encounter (Signed)
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 Otherwise I totally understand why my favorite Doctor wants me to come in to see her. I am eating right, losing weight and cutting back on my drinking, so if it's not too much trouble, if you could approve my lysinopril prescription refills, I would really appreciate it. I am monitoring my blood pressure if you would like to see what it is at. Have a wonderful Christmas and a happy new year.  ----- Message -----  From: Adline Mango, LVN  Sent: 11/18/2016 3:32 PM PST  To: Brett Palmer  Subject: RE: Medication Renewal Request    Hi [name],  Your medication lisinopril-hydrochlorothiazide (PRINZIDE, ZESTORETIC) 10-12.5 MG tablet filled and sent to pharmacy. You're due for labs and BP f/u, please schedule an appointment. Please call [phone] to schedule.  Diamond Nickel    ----- Message -----   From: Brett Palmer   Sent: 11/10/2016 2:01 PM PST   To: Brett Bering, MD  Subject: Medication Renewal Request    Original authorizing provider: [name], MD    Brett Palmer would like a refill of the following medications:  lisinopril-hydrochlorothiazide (PRINZIDE, ZESTORETIC) 10-12.5 MG tablet Brett Bering, MD]  fluticasone propionate (FLONASE) 50 MCG/ACT nasal spray Brett Bering, MD]    Preferred pharmacy: [facility] [id number] - [address], [place] AT [place]:

## 2016-12-08 ENCOUNTER — Telehealth (INDEPENDENT_AMBULATORY_CARE_PROVIDER_SITE_OTHER): Payer: Self-pay | Admitting: Family Medicine

## 2016-12-08 NOTE — Telephone Encounter (Signed)
Symptom Triage          Name of PCP Provider: [name]   Insurance Coverage Verified: Active  Last office visit: Visit date not found  Next office visit:  12/24/2016    Who is reporting the symptoms? Patient    What symptom is the patient experiencing? Shortness of breath OR difficulty breathing . Pt is having SVT flare up. Pt is having shortness of breath, fatigue and rapid heart heart. Agent schedule pt with pcp first available appt.       Is this a new or ongoing symptom? new  Estimated time since experiencing symptom(s)? Few days     Best way to contact patient: [phone] home   Alternative communication method: [phone] home     Has been advised this message with symptoms will be transmitted to triage nurse.

## 2016-12-08 NOTE — Telephone Encounter (Signed)
RN triage, Pt reported:  Shortness of breath, fatigue, rapid heart rate x 5 weeks off and on but more frequent lately  Cardiac ablation history in 2016  Started new job at [employer], stressed with work  App on the phone records leads, in and out of A-fib, HR 109-110  Denies coffee intake due to rapid heart rate  Friday had same symptoms: shortness of breath and rapid heart rate  Feels like he ran a marathon  Rested and took metoprolol, symptoms went away  Was off metoprolol for a while, but now takes metoprolol twice a day  Friend had a massive heart attack, which worries pt at the moment  Denies chest pain or shortness of breath at the moment  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"      Plan:  Advised to go to ED, but pt stated that he knows whats going to happen given that he doesn't have any symptoms at the moment, they wont see anything  Scheduled appt with MD Salvadore Dom tomorrow  Strict ED precautions given for any new or worsening symptoms, pt verbalized he will go to ED as soon as he feels something      Future Appointments  Date Time Provider [place]   12/09/2016 9:40 AM [name], MD [facility]   12/24/2016 10:20 AM [name], MD [facility]         ED precautions given for chest pain, shortness of breath, headaches, dizziness, numbness, tingling, lightheadedness, nausea, vomiting, abdominal pain, or any new or  worsening symptoms. Pt appreciative and verbalized understanding.   Oceanographer

## 2016-12-09 ENCOUNTER — Encounter (INDEPENDENT_AMBULATORY_CARE_PROVIDER_SITE_OTHER): Payer: Self-pay

## 2016-12-09 ENCOUNTER — Other Ambulatory Visit: Payer: Commercial Managed Care - HMO | Attending: Family Medicine | Admitting: Family Medicine

## 2016-12-09 ENCOUNTER — Encounter (INDEPENDENT_AMBULATORY_CARE_PROVIDER_SITE_OTHER): Payer: Self-pay | Admitting: Family Medicine

## 2016-12-09 VITALS — BP 127/84 | HR 100 | Temp 97.3°F | Resp 20 | Ht 72.0 in | Wt 238.0 lb

## 2016-12-09 DIAGNOSIS — F1010 Alcohol abuse, uncomplicated: Secondary | ICD-10-CM

## 2016-12-09 DIAGNOSIS — R0600 Dyspnea, unspecified: Secondary | ICD-10-CM | POA: Insufficient documentation

## 2016-12-09 DIAGNOSIS — I2119 ST elevation (STEMI) myocardial infarction involving other coronary artery of inferior wall: Secondary | ICD-10-CM

## 2016-12-09 DIAGNOSIS — R079 Chest pain, unspecified: Secondary | ICD-10-CM

## 2016-12-09 DIAGNOSIS — I471 Supraventricular tachycardia: Secondary | ICD-10-CM

## 2016-12-09 DIAGNOSIS — R9431 Abnormal electrocardiogram [ECG] [EKG]: Secondary | ICD-10-CM

## 2016-12-09 DIAGNOSIS — Z1211 Encounter for screening for malignant neoplasm of colon: Secondary | ICD-10-CM | POA: Insufficient documentation

## 2016-12-09 DIAGNOSIS — F419 Anxiety disorder, unspecified: Secondary | ICD-10-CM

## 2016-12-09 DIAGNOSIS — R002 Palpitations: Principal | ICD-10-CM | POA: Insufficient documentation

## 2016-12-09 LAB — COMPREHENSIVE METABOLIC PANEL, BLOOD
ALT (SGPT): 129 U/L — ABNORMAL HIGH (ref 0–41)
AST (SGOT): 146 U/L — ABNORMAL HIGH (ref 0–40)
Albumin: 4.5 g/dL (ref 3.5–5.2)
Alkaline Phos: 92 U/L (ref 40–129)
Anion Gap: 15 mmol/L (ref 7–15)
BUN: 8 mg/dL (ref 6–20)
Bicarbonate: 25 mmol/L (ref 22–29)
Bilirubin, Tot: 0.68 mg/dL (ref <1.2)
Calcium: 9.3 mg/dL (ref 8.5–10.6)
Chloride: 98 mmol/L (ref 98–107)
Creatinine: 0.73 mg/dL (ref 0.67–1.17)
GFR: >60 mL/min
Glucose: 104 mg/dL — ABNORMAL HIGH (ref 70–99)
Potassium: 4.6 mmol/L (ref 3.5–5.1)
Sodium: 138 mmol/L (ref 136–145)
Total Protein: 8.1 g/dL — ABNORMAL HIGH (ref 6.0–8.0)

## 2016-12-09 LAB — CBC WITH DIFF, BLOOD
ANC-Automated: 3.6 10*3/uL (ref 1.6–7.0)
Abs Eosinophils: 0.2 10*3/uL (ref 0.1–0.5)
Abs Lymphs: 2 10*3/uL (ref 0.8–3.1)
Abs Monos: 0.6 10*3/uL (ref 0.2–0.8)
Eosinophils: 3 %
Hct: 46.4 % (ref 40.0–50.0)
Hgb: 16 gm/dL (ref 13.7–17.5)
Lymphocytes: 31 %
MCH: 31.8 pg (ref 26.0–32.0)
MCHC: 34.5 g/dL (ref 32.0–36.0)
MCV: 92.2 um3 (ref 79.0–95.0)
MPV: 9.8 fL (ref 9.4–12.4)
Monocytes: 9 %
Plt Count: 192 10*3/uL (ref 140–370)
RBC: 5.03 10*6/uL (ref 4.60–6.10)
RDW: 13.6 % (ref 12.0–14.0)
Segs: 56 %
WBC: 6.4 10*3/uL (ref 4.0–10.0)

## 2016-12-09 LAB — D-DIMER HIGHLY SENSITIVE, BLOOD: D-Dimer HS: <150 ng/mL D-DU (ref <241)

## 2016-12-09 LAB — TSH, BLOOD: TSH: 3.57 u[IU]/mL (ref 0.27–4.20)

## 2016-12-09 LAB — PRO BNP, BLOOD: BNPP: 19 pg/mL (ref 0–899)

## 2016-12-09 MED ORDER — METOPROLOL TARTRATE 37.5 MG PO TABS
37.5000 mg | ORAL_TABLET | Freq: Two times a day (BID) | ORAL | 0 refills | Status: DC
Start: 2016-12-09 — End: 2017-01-07

## 2016-12-09 NOTE — Interdisciplinary (Signed)
Pre-visit chart review and huddle completed with staff and physician.    Outstanding labs, imaging and consults reviewed and identified.    Health maintanence issues identified and addressed:    Health Maintenance   Topic Date Due    COLON CANCER SCREENING WITH COLONOSCOPY  01/24/2016    INFLUENZA VACCINE  07/01/2016    IMM_TD/TDAP=>51 YO  07/11/2018

## 2016-12-09 NOTE — Patient Instructions (Addendum)
Switch to metoprolol 37.5mg  twice daily.  Labs on your way out.  Schedule: stress test, echo, cardiology appt

## 2016-12-09 NOTE — Progress Notes (Signed)
Family Medicine Clinic Progress Note    CC:   Chief Complaint   Patient presents with    Shortness of Breath    Palpitations         Subjective: Brett Palmer is a 51 year old male who is here for CC above.  Per triage note:  Shortness of breath, fatigue, rapid heart rate x 5 weeks off and on but more frequent lately  Cardiac ablation history in 2016  Started new job at [employer], stressed with work  App on the phone records leads, in and out of A-fib, HR 109-110  Denies coffee intake due to rapid heart rate  Friday had same symptoms: shortness of breath and rapid heart rate  Feels like he ran a marathon  Rested and took metoprolol, symptoms went away  Was off metoprolol for a while, but now takes metoprolol twice a day  Friend had a massive heart attack, which worries pt at the moment  Denies chest pain or shortness of breath at the moment  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 Has refused antidepressants because feels they caused SE  Taking Xanax 1mg  TID  Wakes up with nightmares, anxiety  Mother suddenly passed away in Oct 15, 2023, found to have CHF and likely passed of MI.  Close friend also dying of cardiac issues  Pt has intermittent dyspnea since May, also intermittent palpitations. Easily fatigued.   Has been using a phone app cardiac monitor that showed a couple episodes of Afib  Has been very stressed with his job  Sx worse with exertion  No leg swelling or calf pain  Did have some chest pain on Friday 1/5 x15 min, onset at rest that relieved on its own  Had stopped metop after ablation as he was feeling well and was told OK to do so. But restarted in August.    FHx of CHF in mom, maternal aunt.  Patient concerned that he could have CHF, as well.    Family history and social history reviewed/updated.      Patient  Active Problem List   Diagnosis    Anxiety    Palpitations    ETOH abuse    PTSD (post-traumatic stress disorder)    Transaminitis     Outpatient Medications Prior to Visit   Medication Sig Dispense Refill    ALPRAZolam (XANAX) 1 MG tablet Take 1 tablet (1 mg) by mouth 3 times daily as needed for Anxiety. 12 tablet 0    fluticasone propionate (FLONASE) 50 MCG/ACT nasal spray Spray 2 sprays into each nostril daily. 1 bottle 11    lisinopril (PRINIVIL, ZESTRIL) 10 MG tablet Take 1 tablet by mouth daily. 30 tablet 5    lisinopril-hydrochlorothiazide (PRINZIDE, ZESTORETIC) 10-12.5 MG tablet Take 1 tablet by mouth daily. MAKE APPT FOR REFILLS 90 tablet 0    metoprolol tartrate (LOPRESSOR) 25 MG tablet Take 1 tablet (25 mg) by mouth 2 times daily. 60 tablet 0    omeprazole (PRILOSEC) 20 MG capsule Take by mouth.      promethazine (PHENERGAN) 25 MG tablet Take 1 tablet (25 mg) by mouth every 6 hours as needed for Nausea. 30 tablet 1    sumatriptan (IMITREX) 100 MG tablet Take 1 tablet by mouth  once as needed for Migraine. May repeat in 2 hours in needed 10 tablet 5    traMADol (ULTRAM) 50 MG tablet Take 1 tablet (50 mg) by mouth every 6 hours as needed for Moderate Pain (Pain Score 4-6). 20 tablet 0    traMADol (ULTRAM) 50 MG tablet Take by mouth.      zolpidem (AMBIEN) 10 MG tablet Take 1 tablet (10 mg) by mouth nightly as needed for Insomnia. 30 tablet 0     No facility-administered medications prior to visit.      Immunization History   Administered Date(s) Administered    Influenza Vaccine >=3 Years 10/04/2012, 11/06/2014, 09/03/2015, 09/15/2016    Pneumococcal 23 Vaccine (PNEUMOVAX-23) 11/06/2014, 09/15/2016    Tdap 09/15/2016    Tuberculin PPD 11/29/2014     Allergies   Allergen Reactions    Compazine Anxiety    Latex Rash       Objective:  Vitals:    12/09/16 0939   BP: 127/84   BP Location: Left arm   BP Patient Position: Sitting   BP cuff size: Large   Pulse: 100   Resp: 20   Temp: 97.3 F  (36.3 C)   TempSrc: Oral   SpO2: 94%   Weight: 108 kg (238 lb)   Height: 6' (1.829 m)     Estimated body mass index is 32.28 kg/(m^2) as calculated from the following:    Height as of this encounter: 6' (1.829 m).    Weight as of this encounter: 108 kg (238 lb).    Physical Examination  General Appearance: healthy, alert, no distress, anxious affect, cooperative.  Heart:  Tachycardic, regular rhythm, no murmurs, clicks, or gallops.  Lungs: clear to auscultation, respirations unlabored, no chest deformities noted.  Extremities:  no cyanosis, clubbing, or edema. No calf pain, negative Homans.      Labs reviewed:  Results for Brett, Palmer (MRN [id number]) as of 01/19/2017 11:11   Ref. Range 09/03/2015 14:50   Sodium Latest Ref Range: 136 - 145 mmol/L 141   Potassium Latest Ref Range: 3.5 - 5.1 mmol/L 3.9   Chloride Latest Ref Range: 98 - 107 mmol/L 99   Bicarbonate Latest Ref Range: 22 - 29 mmol/L 26   Anion Gap Latest Ref Range: 7 - 15 mmol/L 16 (H)   BUN Latest Ref Range: 6 - 20 mg/dL 6   Creatinine Latest Ref Range: 0.67 - 1.17 mg/dL 0.79   GFR Latest Units: mL/min >60   Glucose Latest Ref Range: 70 - 99 mg/dL 94   Calcium Latest Ref Range: 8.5 - 10.6 mg/dL 9.4   Alkaline Phos Latest Ref Range: 40 - 129 U/L 87   ALT (SGPT) Latest Ref Range: 0 - 41 U/L 320 (H)   AST (SGOT) Latest Ref Range: 0 - 40 U/L 405 (H)   Bilirubin, Tot Latest Ref Range: <1.20 mg/dL 0.75   Albumin Latest Ref Range: 3.5 - 5.2 g/dL 4.4   Total Protein Latest Ref Range: 6.0 - 8.0 g/dL 7.8   Glyco Hgb (A1C) Latest Ref Range: 4.8 - 5.9 % 5.6   Triglycerides Latest Ref Range: 10 - 170 mg/dL 115   Cholesterol Latest Ref Range: <200 mg/dL 232 (H)   HDL-Cholesterol Latest Units: mg/dL 51   Non-HDL Cholesterol Latest Units: mg/dL 181   LDL-Chol (Calc) Latest Ref Range: <160 mg/dL 158       Studies reviewed:  Last exercise stress test in 2012, WNL.  Nuclear stress in May 2016, WNL  EKG performed today and personally reviewed: NSR, T wave flattening in III,  aVF, V6. TWI in V1-V5.       Assessment: Brett Palmer is a 51 year old male with history of SVT status post ablation, presenting with recurrent palpitations and new dyspnea.    Plan:  1. Palpitations  While on initial review of patient's EKG, I was concerned for the multiple T-wave inversions, this appears to be relatively stable from his prior EKG in 2016.  Regardless, it is prudent to repeat his cardiac studies, including stress test and echo, as well as update his labs to check on his electrolytes and rule out anemia and thyroid dysregulation. Will increase metoprolol dose from 25 mg b.i.d. to 37.5 mg b.i.d. given patient's symptoms and elevated pulse on exam today. Will also refer back to EP Clinic for further recommendations.  Reviewed at length with patient regarding ED precautions for chest pain and palpitations.  - ECG, In Clinic; Future  - Comprehensive Metabolic Panel - See Instructions; Future  - CBC w/Auto Diff Lavender; Future  - TSH, Blood - See Instructions; Future  - Complete 2D ECHO with Image Enhancement Agent if Necessary; Future  - Nuc Med Myocardial Perfusion Stress And Rest; Future  - Electrophysiology Clinic  - Event Monitor; Future    2. SVT (supraventricular tachycardia) (CMS-HCC)  - Metoprolol Tartrate 37.5 MG TABS; Take 37.5 mg by mouth 2 times daily. Dispense: 60 tablet; Refills: 0    3. Chest pain, unspecified type  - Complete 2D ECHO with Image Enhancement Agent if Necessary; Future  - Nuc Med Myocardial Perfusion Stress And Rest; Future    4. Dyspnea, unspecified type  Will check for signs of fluid overload on BMP, the patient does not appear fluid overloaded on exam.  Also low pre-test probability for PE (Well's score of 1.5), thus will opt for D-dimer given tachycardia and dyspnea to determine if CTA is needed.  - D-Dimer Highly Sensitive, Blood Blue; Future  - Pro Bnp, Blood Green Plasma Separator Tube; Future    5. ETOH abuse  Counseled patient at length on the importance of  continuing to decrease his alcohol intake.  Congratulated him on his progress so far, especially in the context of stressors with his mother's death adnd his own medical issues.  Continue to monitor closely.  Will also trend his liver enzymes, which have been quite elevated in the past.  Suspect that this is mainly due to alcoholic transaminitis as well as fatty liver, as patient did have an abdominal ultrasound showing hepatomegaly and fatty liver back in 2016.    6. Colon cancer screening  Pt prefers FIT testing.  - Stool Immunochemical Occult Blood; Future    7. Anxiety  Encouraged medical leave given patient's debilitating symptoms as well as severe anxiety that has disrupted his ability to work effectively and safely. Also strongly cautioned on the major risks of combining EtOH & benzo, explaining that he should never be on both at the same time    Over 40 minutes was spent in face-to-face time with patient, with over 50% of the time spent counseling and coordinating care for his cardiopulmonary symptoms and alcohol use disorder as enumerated above.    Return in about 2 weeks (around 12/23/2016) for follow-up palpitations.    [name], MD    Patient Instruction:   Medications reviewed with patient and medication list reconciled.   Over the counter medications, herbal therapies and supplements reviewed.   Patient's  understanding and response to medications assessed.   Barriers to medications assessed and addressed.   Risks, benefits, alternatives to medications reviewed.   Barriers to Learning assessed: none. Patient verbalizes understanding of teaching and instructions.    Patient Instructions   Switch to metoprolol 37.5mg  twice daily.  Labs on your way out.  Schedule: stress test, echo, cardiology appt

## 2016-12-09 NOTE — Interdisciplinary (Signed)
Blood drawn from left arm with 21 gauge needle. 3 tubes taken.   Patient identity authenticated by Quinisha Mould Rae Christiane Sistare.

## 2016-12-10 ENCOUNTER — Encounter (INDEPENDENT_AMBULATORY_CARE_PROVIDER_SITE_OTHER): Payer: Self-pay | Admitting: Family Medicine

## 2016-12-10 NOTE — Telephone Encounter (Signed)
From: Jackalyn Lombard  To: [name], MD  Sent: 12/10/2016 10:41 AM PST  Subject: 5-General Compliment    I filled out my part, I have attached the part that you fill out.    Thanks so much for being such a great Doctor, just talking to you yesterday made feel better in knowing that you care about your patients    Confirmation  You are responsible for providing your claim receipt number to your physician/practitioner so they may complete and submit a medical certification for your claim. Your claim form is not complete without the Physician/Practitioner's Certificate. For faster processing, your physician/practitioner may complete and submit this form online at [email or web address].  If you are receiving temporary workers compensation benefits and are filing for reduced Disability Insurance benefits for the same days, PART B  PHYSICIAN/PRACTITIONERS CERTIFICATE of this form is not required, however after filing, contact SDI by calling [phone].    Form Receipt Number: [id number]  .

## 2016-12-10 NOTE — Telephone Encounter (Signed)
Message routed to Dr. Celebi.

## 2016-12-11 ENCOUNTER — Encounter (INDEPENDENT_AMBULATORY_CARE_PROVIDER_SITE_OTHER): Payer: Self-pay | Admitting: Family Medicine

## 2016-12-11 LAB — ECG 12-LEAD
ATRIAL RATE: 97 {beats}/min
ECG INTERPRETATION: NORMAL
P AXIS: 27 degrees
PR INTERVAL: 158 ms
QRS INTERVAL/DURATION: 90 ms
QT: 346 ms
QTC INTERVAL: 439 ms
R AXIS: 64 degrees
T AXIS: 33 degrees
VENTRICULAR RATE: 97 {beats}/min

## 2016-12-11 NOTE — Telephone Encounter (Signed)
From: Jackalyn Lombard  To: [name], MD  Sent: 12/11/2016 10:07 AM PST  Subject: 20-Other    Morning Dr. Salvadore Dom,    I hate to be a pain, but my work is requesting a letter from you stating I will be out. If you could put the time as unknown with the various tests and workups that need to be done, I would appreciate it. Also, I will be following up with the [facility] as well for appointments to get my ears checked for hearing aids and getting a CPAP.    FYI, last night I had some swelling in my right foot. I forgot to mention when I was in your office that I have experienced a feeling of a thrill in my lower right foot, with them being elevated, but I was normal other than that.    Regards,    Brett Palmer

## 2016-12-16 ENCOUNTER — Ambulatory Visit (HOSPITAL_BASED_OUTPATIENT_CLINIC_OR_DEPARTMENT_OTHER): Payer: Commercial Managed Care - HMO

## 2016-12-16 ENCOUNTER — Telehealth (HOSPITAL_COMMUNITY): Payer: Self-pay | Admitting: Cardiology

## 2016-12-16 NOTE — Telephone Encounter (Signed)
Brief EP note:    Please make appointment to see me, first available, no overbook.     He should get the tests in the mean time.    Elesa Massed, MD, MAS

## 2016-12-16 NOTE — Telephone Encounter (Signed)
Patient called because has has been having episodes of a-fib intermittently for the past year and the episodes seem to be getting worse. His PCP, has ordered an event monitor, MIBI and echo. Should he come in to see Dr. Rigoberto Noel as well or just wait for results?

## 2016-12-16 NOTE — Telephone Encounter (Signed)
Pt last seen 2015, testing has been ordered.   Routing to provider, would you like to wait to see pt upon completion of Event monitor, ECHO and MIBI.

## 2016-12-17 NOTE — Telephone Encounter (Signed)
Called and scheduled patient on 05/07/17.

## 2016-12-19 ENCOUNTER — Telehealth (HOSPITAL_COMMUNITY): Payer: Self-pay | Admitting: Family Medicine

## 2016-12-19 NOTE — Telephone Encounter (Signed)
Preventice patch order to be mailed. For more information on status call [phone]

## 2016-12-22 ENCOUNTER — Telehealth (INDEPENDENT_AMBULATORY_CARE_PROVIDER_SITE_OTHER): Payer: Self-pay | Admitting: Family Medicine

## 2016-12-22 NOTE — Telephone Encounter (Signed)
Patient wants to notify Dr.Celebi he is canceling both visits he had.  Per patient has not completed his echo and some labs and he feels it wont be beneficial to come in if not completed.  Per patient he I states there hasn't  been any updates on condition since he was last seen  Patient notified of 72 hour turn around time frame.

## 2016-12-22 NOTE — Telephone Encounter (Signed)
Message routed to Dr. Celebi.

## 2016-12-22 NOTE — Telephone Encounter (Signed)
Noted. Pt's appt has been rescheduled to after his echo and stress test.

## 2016-12-24 ENCOUNTER — Encounter (INDEPENDENT_AMBULATORY_CARE_PROVIDER_SITE_OTHER): Payer: Commercial Managed Care - HMO | Admitting: Family Medicine

## 2016-12-25 ENCOUNTER — Encounter (INDEPENDENT_AMBULATORY_CARE_PROVIDER_SITE_OTHER): Payer: Commercial Managed Care - HMO | Admitting: Family Medicine

## 2016-12-31 ENCOUNTER — Ambulatory Visit
Admission: RE | Admit: 2016-12-31 | Discharge: 2016-12-31 | Disposition: A | Discharge status: Home / Self Care | Payer: Commercial Managed Care - HMO | Source: Ambulatory Visit | Attending: Family Medicine | Admitting: Family Medicine

## 2016-12-31 DIAGNOSIS — R002 Palpitations: Secondary | ICD-10-CM

## 2016-12-31 DIAGNOSIS — I2510 Atherosclerotic heart disease of native coronary artery without angina pectoris: Secondary | ICD-10-CM | POA: Insufficient documentation

## 2016-12-31 DIAGNOSIS — Z8679 Personal history of other diseases of the circulatory system: Secondary | ICD-10-CM | POA: Insufficient documentation

## 2016-12-31 DIAGNOSIS — R0602 Shortness of breath: Secondary | ICD-10-CM

## 2016-12-31 DIAGNOSIS — R079 Chest pain, unspecified: Principal | ICD-10-CM | POA: Insufficient documentation

## 2016-12-31 DIAGNOSIS — M5489 Other dorsalgia: Secondary | ICD-10-CM | POA: Insufficient documentation

## 2016-12-31 MED ORDER — TECHNETIUM TC 99M SESTAMIBI IV KIT
15.0000 | PACK | Freq: Once | INTRAVENOUS | Status: AC
Start: 2016-12-31 — End: 2016-12-31
  Administered 2016-12-31: 46.6 via INTRAVENOUS
  Filled 2016-12-31: qty 15

## 2016-12-31 MED ORDER — TECHNETIUM TC 99M SESTAMIBI IV KIT
14.2000 | PACK | Freq: Once | INTRAVENOUS | Status: AC
Start: 2016-12-31 — End: 2016-12-31
  Administered 2016-12-31: 14.2 via INTRAVENOUS
  Filled 2016-12-31: qty 14.2

## 2017-01-01 ENCOUNTER — Other Ambulatory Visit (INDEPENDENT_AMBULATORY_CARE_PROVIDER_SITE_OTHER): Payer: Commercial Managed Care - HMO

## 2017-01-05 ENCOUNTER — Telehealth (INDEPENDENT_AMBULATORY_CARE_PROVIDER_SITE_OTHER): Payer: Self-pay | Admitting: Family Medicine

## 2017-01-05 NOTE — Telephone Encounter (Signed)
TELEPHONE TRIAGE     Received page to call [facility] regarding Mr. Davtyan.  51 yo on heart monitor, started on 31st January.  V-Tach 8 beats 164 at 5:15, followed bye NSR of 74 felt fatigued, short of breath, while lying down  NSR 73 currently, still feeling fatigued per agent's report.  Current protocol is to notify MD if >8 runs of V-tach.    Called patient to check on his current status.  Has been feeling "pretty crappy" all weekend, not able to tolerate minor activity  Pulse 92-93%  Has hx of SVT with ablation in 2016.  Friday was doing perfectly normal until Saturday morning when he started feeling like this.  Denies any chest pain at the time, did have some pain between shoulders on the day he had his Nuclear Medicine.  He denies any major changes over the weekend, no new medications, has continued to take his Metoprolol 37.5 mg BID as prescribed.      PLAN:  - I discussed with patient and wife who was listening on speaker that based on current heart monitor findings, and his current symptoms he should go to the ED for further evaluation.   - Patient states he will not go to he ED at this point because he is worried he might catch the Flu, he will however go if his symptoms worsen, or if he received another call from [place] stating he's still having runs of V-Tach.      Patient discussed with Dr. Arbie Cookey.        Charlsie Merles, MD  PGY-3  [place]  [place] [id number]  P [phone]

## 2017-01-06 ENCOUNTER — Other Ambulatory Visit
Admission: RE | Admit: 2017-01-06 | Discharge: 2017-01-06 | Disposition: A | Discharge status: Home / Self Care | Payer: Commercial Managed Care - HMO | Attending: Family Medicine | Admitting: Family Medicine

## 2017-01-06 ENCOUNTER — Other Ambulatory Visit
Admit: 2017-01-06 | Discharge: 2017-01-06 | Disposition: A | Discharge status: Home / Self Care | Payer: Commercial Managed Care - HMO | Attending: Family Medicine | Admitting: Family Medicine

## 2017-01-06 DIAGNOSIS — Z1211 Encounter for screening for malignant neoplasm of colon: Principal | ICD-10-CM | POA: Insufficient documentation

## 2017-01-06 NOTE — Telephone Encounter (Signed)
Called pt. No answer. Left message to call clinic.

## 2017-01-06 NOTE — Telephone Encounter (Signed)
Late Entry:    Reviewed case. Agree with resident's plan and recommendation. See below for further details.     Triage please call pt as a f/u. Recommend f/u visit with PCP.

## 2017-01-07 ENCOUNTER — Encounter (INDEPENDENT_AMBULATORY_CARE_PROVIDER_SITE_OTHER): Payer: Self-pay | Admitting: Family Medicine

## 2017-01-07 ENCOUNTER — Ambulatory Visit (INDEPENDENT_AMBULATORY_CARE_PROVIDER_SITE_OTHER): Payer: Commercial Managed Care - HMO | Admitting: Family Medicine

## 2017-01-07 ENCOUNTER — Encounter (INDEPENDENT_AMBULATORY_CARE_PROVIDER_SITE_OTHER): Payer: Self-pay

## 2017-01-07 VITALS — BP 116/76 | HR 78 | Temp 99.2°F | Resp 18 | Ht 72.0 in | Wt 241.0 lb

## 2017-01-07 DIAGNOSIS — F1010 Alcohol abuse, uncomplicated: Secondary | ICD-10-CM

## 2017-01-07 DIAGNOSIS — R0600 Dyspnea, unspecified: Principal | ICD-10-CM

## 2017-01-07 DIAGNOSIS — I10 Essential (primary) hypertension: Secondary | ICD-10-CM

## 2017-01-07 DIAGNOSIS — R002 Palpitations: Secondary | ICD-10-CM

## 2017-01-07 LAB — STOOL IMMUNOCHEMICAL OCCULT BLOOD: Occult Blood, Immunochem: NEGATIVE

## 2017-01-07 MED ORDER — METOPROLOL TARTRATE 37.5 MG PO TABS
37.5000 mg | ORAL_TABLET | Freq: Two times a day (BID) | ORAL | 2 refills | Status: DC
Start: 2017-01-07 — End: 2017-04-30

## 2017-01-07 NOTE — Progress Notes (Signed)
Family Medicine Clinic Progress Note    CC:   Chief Complaint   Patient presents with    Follow up Results         Subjective: Brett Palmer is a 51 year old male who is here for CC above.    #Palpitations:  Hx of SVT s/p ablation in 2016  Overall doing well with increased dose of metop. Pulse now generally in the 70s but will shoot up with exertion.  Has had event monitor, almost done with 2 weeks  Had a run of 8 beats of VTach on the event monitor on 2/5. Had just been feeling run down that day  After stress test, developed tachycardia, low O2 sat, dyspnea, and pain between shoulder blades.  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 Uses wife's inhaler periodically, which helps    #Transaminitis:   Fatty liver seen on [place] in 2011  Has been cutting down on EtOH since October, down to 1 bottle of wine daily    #HTN:   Has actually not been taking ACEI/HCTZ lately as his BP has been at goal    #Anxiety:  Doing intake with new psych at [facility] at end of month. Really wants to wean Xanax if possible (takes 3mg  QHS). [facility] is planning to put him on an "unemployable" track as another option as he didn't qualify for disability. 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    PMH reviewed/UTD.    Patient Active Problem List   Diagnosis    Anxiety    Palpitations    ETOH abuse    PTSD (post-traumatic stress disorder)    Transaminitis     Outpatient Medications Prior to Visit   Medication Sig Dispense Refill    ALPRAZolam (XANAX) 1 MG tablet Take 1 tablet (1 mg) by mouth 3 times daily as needed for Anxiety. 12 tablet 0    fluticasone propionate (FLONASE) 50 MCG/ACT nasal spray Spray 2 sprays into each nostril daily. 1 bottle 11    lisinopril (PRINIVIL, ZESTRIL) 10 MG tablet  Take 1 tablet by mouth daily. 30 tablet 5    lisinopril-hydrochlorothiazide (PRINZIDE, ZESTORETIC) 10-12.5 MG tablet Take 1 tablet by mouth daily. MAKE APPT FOR REFILLS 90 tablet 0    Metoprolol Tartrate 37.5 MG TABS Take 37.5 mg by mouth 2 times daily. 60 tablet 0    omeprazole (PRILOSEC) 20 MG capsule Take by mouth.      promethazine (PHENERGAN) 25 MG tablet Take 1 tablet (25 mg) by mouth every 6 hours as needed for Nausea. 30 tablet 1    sumatriptan (IMITREX) 100 MG tablet Take 1 tablet by mouth once as needed for Migraine. May repeat in 2 hours in needed 10 tablet 5    traMADol (ULTRAM) 50 MG tablet Take 1 tablet (50 mg) by mouth every 6 hours as needed for Moderate Pain (Pain Score 4-6). 20 tablet 0    zolpidem (AMBIEN) 10 MG tablet Take 1 tablet (10 mg) by mouth nightly as needed for Insomnia. 30 tablet 0     No facility-administered medications prior to visit.      Immunization History   Administered Date(s) Administered    Influenza Vaccine >=3 Years 10/04/2012,  11/06/2014, 09/03/2015, 09/15/2016    Pneumococcal 23 Vaccine (PNEUMOVAX-23) 11/06/2014, 09/15/2016    Tdap 09/15/2016    Tuberculin PPD 11/29/2014     Allergies   Allergen Reactions    Compazine Anxiety    Latex Rash       Objective:  Vitals:    01/07/17 1120   BP: 116/76   BP Location: Left arm   BP Patient Position: Sitting   BP cuff size: Regular   Pulse: 78   Resp: 18   Temp: 99.2 F (37.3 C)   TempSrc: Oral   SpO2: 95%   Weight: 109.3 kg (241 lb)   Height: 6' (1.829 m)   PF: 570 L/min     Estimated body mass index is 32.69 kg/(m^2) as calculated from the following:    Height as of this encounter: 6' (1.829 m).    Weight as of this encounter: 109.3 kg (241 lb).    Physical Examination  General Appearance: healthy, alert, no distress, pleasant affect, cooperative.  Heart:  normal rate and regular rhythm, no murmurs, clicks, or gallops.  Lungs: clear to auscultation, respirations unlabored, no chest deformities noted.  Extremities:   no cyanosis, clubbing, or edema.      Labs reviewed:   Ref. Range 12/09/2016 10:55   Sodium Latest Ref Range: 136 - 145 mmol/L 138   Potassium Latest Ref Range: 3.5 - 5.1 mmol/L 4.6   Chloride Latest Ref Range: 98 - 107 mmol/L 98   Bicarbonate Latest Ref Range: 22 - 29 mmol/L 25   Anion Gap Latest Ref Range: 7 - 15 mmol/L 15   BUN Latest Ref Range: 6 - 20 mg/dL 8   Creatinine Latest Ref Range: 0.67 - 1.17 mg/dL 0.73   GFR Latest Units: mL/min >60   Glucose Latest Ref Range: 70 - 99 mg/dL 104 (H)   Calcium Latest Ref Range: 8.5 - 10.6 mg/dL 9.3   Alkaline Phos Latest Ref Range: 40 - 129 U/L 92   ALT (SGPT) Latest Ref Range: 0 - 41 U/L 129 (H)   AST (SGOT) Latest Ref Range: 0 - 40 U/L 146 (H)   Bilirubin, Tot Latest Ref Range: <1.2 mg/dL 0.68   Albumin Latest Ref Range: 3.5 - 5.2 g/dL 4.5   Total Protein Latest Ref Range: 6.0 - 8.0 g/dL 8.1 (H)   TSH Latest Ref Range: 0.27 - 4.20 uIU/mL 3.57   BNPP Latest Ref Range: 0 - 899 pg/mL 19   WBC Latest Ref Range: 4.0 - 10.0 1000/mm3 6.4   RBC Latest Ref Range: 4.60 - 6.10 mill/mm3 5.03   Hgb Latest Ref Range: 13.7 - 17.5 gm/dL 16.0   Hct Latest Ref Range: 40.0 - 50.0 % 46.4   MCV Latest Ref Range: 79.0 - 95.0 um3 92.2   MCH Latest Ref Range: 26.0 - 32.0 pgm 31.8   MCHC Latest Ref Range: 32.0 - 36.0 g/dL 34.5   RDW Latest Ref Range: 12.0 - 14.0 % 13.6   Plt Count Latest Ref Range: 140 - 370 1000/mm3 192   MPV Latest Ref Range: 9.4 - 12.4 fL 9.8   Segs Latest Units: % 56   Lymphocytes Latest Units: % 31   Monocytes Latest Units: % 9   Eosinophils Latest Units: % 3   ANC-Automated Latest Ref Range: 1.6 - 7.0 1000/mm3 3.6   Abs Lymphs Latest Ref Range: 0.8 - 3.1 1000/mm3 2.0   Abs Monos Latest Ref Range: 0.2 - 0.8 1000/mm3 0.6   Abs Eosinophils Latest  Ref Range: <0.1 - 0.5 1000/mm3 0.2   Diff Type Unknown Automated   D-Dimer HS Latest Ref Range: <241 ng/mL D-DU <150         Studies reviewed:  12/31/16 dobutamine stress test:  IMPRESSION:  1. Normal LV myocardial perfusion. No  evidence of inducible ischemia or prior transmural myocardial infarctions.  2. Normal LV chamber size and systolic function.  3. Average exercise capacity. Normal hemodynamic response to exercise. Prolonged tachycardia during recovery with transient upper back pain and shortness of breath.  4. Overall low risk for hard cardiac events.    Assessment: Brett Palmer is a 51 year old male with the following issues addressed:    Plan:  1. Dyspnea, unspecified type  Unclear etiology. Normal lung exam on auscultation. Peak flows 570, 470, 500. Normal for age/height/gender = 573. Will plan for formal PFTs to eval for possible restrictive lung disease. Perhaps occupational exposure in the service is now leading to pulmonary sequelae? If neg PFTs, will consider chest CT.  - Complete PFT with full Bronch Eval; Future  - Peak Flow Testing    2. Palpitations  Generally well-controlled with increased dose of metop. Continue current regimen and f/u with EP for further recs - encouraged Pt to be on waitlist to get added on for sooner appt if possible. ED precautions also reviewed.  - Metoprolol Tartrate 37.5 MG TABS; Take 37.5 mg by mouth 2 times daily.  Dispense: 60 tablet; Refill: 2    3. Essential hypertension  BP has been controlled with metop only, will stay off other antihypertensives for now.    4. ETOH abuse  Again counseled on health consequences of heavy drinking (and risk with taking benzos) but congratulated on how much he has weaned down thus far. Pt to work with psychiatry to hopefully get his anxiety better managed, which will ideally lead to decreased self-medication with alcohol.      Return in about 6 weeks (around 02/18/2017) for follow-up dyspnea, PFTs.    [name], MD    Patient Instruction:   Medications reviewed with patient and medication list reconciled.    Patient's understanding and response to medications assessed.   Risks, benefits, alternatives to medications reviewed.   Barriers to Learning  assessed: none. Patient verbalizes understanding of teaching and instructions.

## 2017-01-07 NOTE — Interdisciplinary (Signed)
Pre-visit chart review and huddle completed with staff and physician.    Outstanding labs, imaging and consults reviewed and identified.    Health maintanence issues identified and addressed:    Health Maintenance   Topic Date Due    COLON CANCER SCREENING WITH FIT  11-05-66    IMM_TD/TDAP=>51 YO  09/15/2026    INFLUENZA VACCINE  Completed

## 2017-01-08 NOTE — Telephone Encounter (Signed)
01/08/2017 09:48 AM Phone (Outgoing) Brett Palmer, Brett Palmer (Self) [phone] (H)     RN called patient in which there was no answer at this time. Left a message, including clinic's phone number, for patient to call the clinic back. Will follow up. Carleene Cooper RN

## 2017-01-09 NOTE — Telephone Encounter (Signed)
RN call to pt. No answer, left voicemail to call back for follow up.

## 2017-01-13 ENCOUNTER — Telehealth (INDEPENDENT_AMBULATORY_CARE_PROVIDER_SITE_OTHER): Payer: Self-pay | Admitting: Family Medicine

## 2017-01-13 ENCOUNTER — Encounter (INDEPENDENT_AMBULATORY_CARE_PROVIDER_SITE_OTHER): Payer: Self-pay | Admitting: Family Medicine

## 2017-01-13 NOTE — Telephone Encounter (Signed)
From: Jackalyn Lombard  To: [name], MD  Sent: 01/13/2017 1:07 PM PST  Subject: 1-Non Urgent Medical Advice    Afternoon Dr. Salvadore Dom.    I am writing to remind you of the letter you were going to give me for the [employer] stating I am unable to work due to my medical condition.    My appointment is next week, so if I could ge that from you, I would very much appreciate it.    Regards,    Waymond Cera

## 2017-01-13 NOTE — Telephone Encounter (Signed)
Patient calling in stating that he received a letter from his PCP stating that he needed to contact the call center and get in contact with triage ASAP.    Please assist

## 2017-01-13 NOTE — Telephone Encounter (Signed)
02:30PM    Call was transferred direcltly  To RN triage via call center.  RN spoke  w/ 2 patient identifiers verified.  States  that  he sent  message to Dr. Salvadore Dom on [facility] Chart..  States  currently he is on heart monitor, works  as an Museum/gallery curator at [employer].  Requesting a letter from Dr. Salvadore Dom  stating  he is currently unable to maintain employment with [employer] due to his current medical condition  having SVT.  Requesting letter to  [facility] Chart or mail to home address per demographics.   Message sent to Dr. Salvadore Dom.

## 2017-01-17 NOTE — Telephone Encounter (Signed)
Addressed via MyChart

## 2017-01-19 ENCOUNTER — Encounter (INDEPENDENT_AMBULATORY_CARE_PROVIDER_SITE_OTHER): Payer: Self-pay | Admitting: Family Medicine

## 2017-01-20 ENCOUNTER — Ambulatory Visit
Admission: RE | Admit: 2017-01-20 | Discharge: 2017-01-20 | Disposition: A | Discharge status: Home / Self Care | Payer: Commercial Managed Care - HMO | Attending: Family Medicine | Admitting: Family Medicine

## 2017-01-20 ENCOUNTER — Other Ambulatory Visit (INDEPENDENT_AMBULATORY_CARE_PROVIDER_SITE_OTHER): Payer: Commercial Managed Care - HMO

## 2017-01-20 ENCOUNTER — Encounter (INDEPENDENT_AMBULATORY_CARE_PROVIDER_SITE_OTHER): Payer: Self-pay | Admitting: Family Medicine

## 2017-01-20 DIAGNOSIS — R0600 Dyspnea, unspecified: Secondary | ICD-10-CM | POA: Insufficient documentation

## 2017-01-20 DIAGNOSIS — R079 Chest pain, unspecified: Secondary | ICD-10-CM | POA: Insufficient documentation

## 2017-01-20 DIAGNOSIS — R002 Palpitations: Principal | ICD-10-CM | POA: Insufficient documentation

## 2017-01-20 DIAGNOSIS — I517 Cardiomegaly: Secondary | ICD-10-CM

## 2017-01-20 NOTE — Telephone Encounter (Signed)
From: Jackalyn Lombard  To: [name], MD  Sent: 01/20/2017 10:58 AM PST  Subject: 1-Non Urgent Medical Advice    Good MorningDr. Salvadore Dom,    Thank you for writing your letter. I am sorry to be such a bother, but [employer] sent me some forms for you to fill out. Ugh. I have my 2D echo this afternoon, so if its alright, I will drop off the forms at your office around 3pm.    Thanks again,    [facility]

## 2017-01-26 ENCOUNTER — Ambulatory Visit
Admit: 2017-01-26 | Discharge: 2017-01-26 | Payer: Commercial Managed Care - HMO | Attending: Family Medicine | Admitting: Family Medicine

## 2017-01-26 DIAGNOSIS — R002 Palpitations: Principal | ICD-10-CM

## 2017-01-26 LAB — 2D ECHO WITH IMAGE ENHANCEMENT AGENT IF NECESSARY
IVC Diameter: 1.4 cm
LV Ejection Fraction: 71 %
PA Pressure: 20 mmHg

## 2017-01-28 ENCOUNTER — Encounter (INDEPENDENT_AMBULATORY_CARE_PROVIDER_SITE_OTHER): Payer: Self-pay | Admitting: Family Medicine

## 2017-01-28 ENCOUNTER — Ambulatory Visit (INDEPENDENT_AMBULATORY_CARE_PROVIDER_SITE_OTHER): Payer: Commercial Managed Care - HMO | Admitting: Family Medicine

## 2017-01-28 VITALS — BP 135/86 | HR 77 | Temp 97.6°F | Resp 18 | Ht 72.0 in | Wt 242.0 lb

## 2017-01-28 DIAGNOSIS — Z0289 Encounter for other administrative examinations: Principal | ICD-10-CM

## 2017-01-28 DIAGNOSIS — R079 Chest pain, unspecified: Secondary | ICD-10-CM

## 2017-01-28 DIAGNOSIS — F411 Generalized anxiety disorder: Secondary | ICD-10-CM

## 2017-01-28 DIAGNOSIS — E785 Hyperlipidemia, unspecified: Secondary | ICD-10-CM

## 2017-01-28 MED ORDER — PRAVASTATIN SODIUM 40 MG OR TABS
40.0000 mg | ORAL_TABLET | Freq: Every day | ORAL | 2 refills | Status: DC
Start: 2017-01-28 — End: 2017-04-30

## 2017-01-28 NOTE — Telephone Encounter (Signed)
Addressed at appt today

## 2017-01-28 NOTE — Interdisciplinary (Signed)
Pre-visit chart review and huddle completed with staff and physician.    Outstanding labs, imaging and consults reviewed and identified.    Health maintanence issues identified and addressed:    Health Maintenance   Topic Date Due   • COLON CANCER SCREENING WITH FIT  01/06/2018   • IMM_TD/TDAP=>51 YO  09/15/2026   • INFLUENZA VACCINE  Completed

## 2017-01-28 NOTE — Progress Notes (Signed)
Family Medicine Clinic Progress Note    CC:   Chief Complaint   Patient presents with    Forms         Subjective: Brett Palmer is a 51 year old male with prior hx of SVT s/p ablation who is here for CC above.    Has been having intermittent substernal chest discomfort that he woke up with and has been off and on this morning.   Had fast HR of 109, better now s/p metop  No subjective palpitations this morning  Does still get exertional palpitations and dyspnea with pulse ox down to high 80s/low 90s sometimes with diaphoretic  PFT scheduled for 3/2  Feels better today  Event monitor results pending  Seeing EP in June  Thinks it may be r/t anxiety  Just started with a new psychiatrist who he is really happy with, thinks probably a lot of his symptoms are related to stress/anxiety  Taking Xanax 3mg  QHS. Psych plans to switch to more longer-acting benzo - from Xanax to Ativan or Valium. A little limited in other options for anxiety as he hasn't tolerated many meds.  Feels optimistic that he will be able to decrease the alcohol as he makes the switch   Drinking 1/2-3/4 bottle of wine    Needs a letter for [employer]  Needs to be rated as "100% unemployable" to be able to have the option for a new career path. 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    SH reviewed/updated.  Patient Active Problem List   Diagnosis    Anxiety    Palpitations    ETOH abuse    PTSD (post-traumatic stress disorder)    Transaminitis     Outpatient Medications Prior to Visit   Medication Sig Dispense Refill    ALPRAZolam (XANAX) 1 MG tablet Take 1 tablet (1 mg) by mouth 3 times daily as needed for Anxiety. 12 tablet 0    fluticasone propionate (FLONASE) 50 MCG/ACT nasal spray Spray 2 sprays into each nostril daily. 1 bottle 11    lisinopril (PRINIVIL, ZESTRIL) 10 MG tablet  Take 1 tablet by mouth daily. 30 tablet 5    Metoprolol Tartrate 37.5 MG TABS Take 37.5 mg by mouth 2 times daily. 60 tablet 2    omeprazole (PRILOSEC) 20 MG capsule Take by mouth.       No facility-administered medications prior to visit.      Immunization History   Administered Date(s) Administered    Influenza Vaccine >=3 Years 10/04/2012, 11/06/2014, 09/03/2015, 09/15/2016    Pneumococcal 23 Vaccine (PNEUMOVAX-23) 11/06/2014, 09/15/2016    Tdap 09/15/2016    Tuberculin PPD 11/29/2014     Allergies   Allergen Reactions    Compazine Anxiety    Latex Rash       Objective:  Vitals:    01/28/17 1051   BP: 135/86   BP Location: Left arm   BP Patient Position: Sitting   BP cuff size: Regular   Pulse: 77   Resp: 18   Temp: 97.6 F (36.4 C)   TempSrc: Oral   SpO2: 98%   Weight: 109.8 kg (242 lb)   Height: 6' (1.829 m)     Estimated body mass index is 32.82 kg/(m^2) as calculated from the following:    Height as of this  encounter: 6' (1.829 m).    Weight as of this encounter: 109.8 kg (242 lb).    Physical Examination  General Appearance: healthy, alert, no distress, pleasant affect, cooperative.  Heart:  normal rate and regular rhythm, no murmurs, clicks, or gallops.  Lungs: clear to auscultation, respirations unlabored, no chest deformities noted.  Extremities:  no cyanosis, clubbing, or edema.    Studies reviewed:  01/20/17 echo--  1. Technically difficult study with suboptimal images.  2. The left ventricular size is mildly increased and the left ventricular systolic function is normal.  3. No left ventricular hypertrophy.  4. The right ventricular size is mildly enlarged and systolic function is normal.  5. No significant valvular abnormalities.  6. Rcommend use of IV contrast agent to better identify endocardial borders if clinically indicated.  7. Compared to prior study no significant change.    .  Assessment: Brett Palmer is a 51 year old male with the following issues addressed:    Plan:  1.  Encounter for completion of form with patient  Letter written for [facility] as requested.    2. Hyperlipidemia, unspecified hyperlipidemia type  Discussed that given his NAFLD, and LFTs in a better range now, and also some cardiac risk factors, would be wise to start a statin. R/B/A's discussed with Pt and he agrees to proceed.  - pravastatin (PRAVACHOL) 40 MG tablet; Take 1 tablet (40 mg) by mouth daily.  Dispense: 30 tablet; Refill: 2    3. Chest pain, unspecified type  Stress test WNL. Echo non-acute. Will defer any further work-up to cardio.    4. GAD (generalized anxiety disorder)  Primary management per Pt's new psych. I am supportive of any attempt to wean or at least optimize his benzo regimen.    The 10-year ASCVD risk score Mikey Bussing DC Latina Craver al., 2013) is: 5.9%    Values used to calculate the score:      Age: 61 years      Sex: Male      Is Non-Hispanic African American: No      Diabetic: No      Tobacco smoker: No      Systolic Blood Pressure: [id number] mmHg      Is BP treated: Yes      HDL Cholesterol: 51 mg/dL      Total Cholesterol: 232 mg/dL      Low Risk: 0 - 5 %  Moderate Risk: 5 - 7.5 %  High Risk: 7.5 - 100 %      Return in about 2 months (around 03/28/2017) for follow-up cholesterol, liver enzymes, pulmonary function test.    [name], MD    Patient Instruction:   Medications reviewed with patient and medication list reconciled.   Patient's understanding and response to medications assessed.   Barriers to medications assessed and addressed.   Risks, benefits, alternatives to medications reviewed.   Barriers to Learning assessed: none. Patient verbalizes understanding of teaching and instructions.

## 2017-01-30 ENCOUNTER — Ambulatory Visit (HOSPITAL_COMMUNITY): Admit: 2017-01-30 | Payer: Commercial Managed Care - HMO | Source: Ambulatory Visit

## 2017-02-11 ENCOUNTER — Encounter (INDEPENDENT_AMBULATORY_CARE_PROVIDER_SITE_OTHER): Payer: Self-pay | Admitting: Family Medicine

## 2017-02-11 NOTE — Telephone Encounter (Signed)
From: Jackalyn Lombard  To: [name], MD  Sent: 02/11/2017 11:24 AM PDT  Subject: 20-Other    Dr, Salvadore Dom    I was talking to my friend who has the same cardiac condition as I and suffered from the same side affects as I do from the Metoprolol water retention no sex drive ,fatigue and low energy, to do every day activity's. When I was not taking it I was losing weight at a steady rate now I have being gain weight ( mostly water) I drink about 1 1/2 liters a day even thought I being eat right and walking as much as I can. So can I get my meds changed to Diltiazem 120mg  24hr, and 40mg  of Lasix QD. My friend told me it change her life. Please consider, this I'm not dealing well with the Metoprolol.     Best Regards  Brett Palmer [facility]

## 2017-02-13 NOTE — Telephone Encounter (Signed)
Scheduled patient on 02/24/17 at 11:20 am with pcp.

## 2017-02-24 ENCOUNTER — Encounter (INDEPENDENT_AMBULATORY_CARE_PROVIDER_SITE_OTHER): Payer: Self-pay | Admitting: Family Medicine

## 2017-02-24 ENCOUNTER — Other Ambulatory Visit: Payer: Commercial Managed Care - HMO | Attending: Family Medicine | Admitting: Family Medicine

## 2017-02-24 ENCOUNTER — Telehealth (INDEPENDENT_AMBULATORY_CARE_PROVIDER_SITE_OTHER): Payer: Self-pay | Admitting: Family Medicine

## 2017-02-24 VITALS — BP 145/83 | HR 85 | Temp 97.8°F | Resp 18 | Ht 72.0 in | Wt 251.0 lb

## 2017-02-24 DIAGNOSIS — R7401 Elevation of levels of liver transaminase levels: Secondary | ICD-10-CM

## 2017-02-24 DIAGNOSIS — R740 Nonspecific elevation of levels of transaminase and lactic acid dehydrogenase [LDH]: Secondary | ICD-10-CM | POA: Insufficient documentation

## 2017-02-24 DIAGNOSIS — R609 Edema, unspecified: Principal | ICD-10-CM | POA: Insufficient documentation

## 2017-02-24 DIAGNOSIS — R0600 Dyspnea, unspecified: Secondary | ICD-10-CM

## 2017-02-24 DIAGNOSIS — R002 Palpitations: Secondary | ICD-10-CM

## 2017-02-24 DIAGNOSIS — M791 Myalgia, unspecified site: Secondary | ICD-10-CM

## 2017-02-24 DIAGNOSIS — I10 Essential (primary) hypertension: Secondary | ICD-10-CM

## 2017-02-24 LAB — CBC WITH DIFF, BLOOD
ANC-Automated: 3.7 10*3/uL (ref 1.6–7.0)
Abs Eosinophils: 0.2 10*3/uL (ref 0.1–0.5)
Abs Lymphs: 2.1 10*3/uL (ref 0.8–3.1)
Abs Monos: 0.4 10*3/uL (ref 0.2–0.8)
Eosinophils: 3 %
Hct: 43.1 % (ref 40.0–50.0)
Hgb: 14.9 gm/dL (ref 13.7–17.5)
Lymphocytes: 32 %
MCH: 32.1 pg — ABNORMAL HIGH (ref 26.0–32.0)
MCHC: 34.6 g/dL (ref 32.0–36.0)
MCV: 92.9 um3 (ref 79.0–95.0)
MPV: 9.4 fL (ref 9.4–12.4)
Monocytes: 7 %
Plt Count: 157 10*3/uL (ref 140–370)
RBC: 4.64 10*6/uL (ref 4.60–6.10)
RDW: 13.5 % (ref 12.0–14.0)
Segs: 57 %
WBC: 6.4 10*3/uL (ref 4.0–10.0)

## 2017-02-24 LAB — LIVER PANEL, BLOOD
ALT (SGPT): 94 U/L — ABNORMAL HIGH (ref 0–41)
AST (SGOT): 118 U/L — ABNORMAL HIGH (ref 0–40)
Albumin: 4.5 g/dL (ref 3.5–5.2)
Alkaline Phos: 83 U/L (ref 40–129)
Bilirubin, Dir: 0.2 mg/dL (ref <0.2)
Bilirubin, Tot: 0.67 mg/dL (ref <1.2)
Total Protein: 7.5 g/dL (ref 6.0–8.0)

## 2017-02-24 LAB — PROTHROMBIN TIME, BLOOD
INR: 1
PT,Patient: 10.9 s (ref 9.7–12.5)

## 2017-02-24 LAB — PRO BNP, BLOOD: BNPP: 15 pg/mL (ref 0–899)

## 2017-02-24 MED ORDER — DILTIAZEM HCL COATED BEADS 120 MG OR CP24
120.0000 mg | ORAL_CAPSULE | Freq: Every day | ORAL | 0 refills | Status: DC
Start: 2017-02-24 — End: 2017-04-30

## 2017-02-24 MED ORDER — FUROSEMIDE 20 MG OR TABS
20.0000 mg | ORAL_TABLET | Freq: Two times a day (BID) | ORAL | 0 refills | Status: DC
Start: 2017-02-24 — End: 2017-03-09

## 2017-02-24 NOTE — Interdisciplinary (Signed)
Blood drawn from left arm with 21 gauge needle. 3 tubes taken.   Patient identity authenticated by Brett Palmer.

## 2017-02-24 NOTE — Telephone Encounter (Signed)
Thanks

## 2017-02-24 NOTE — Progress Notes (Signed)
[facility] Progress Note    CC:   Chief Complaint   Patient presents with    Medication Review         Subjective: Brett Palmer is a 51 year old male who is here for CC above.    MyChart message from Pt (3/14):  I was talking to my friend who has the same cardiac condition as I and suffered from the same side affects as I do from the Metoprolol water retention no sex drive ,fatigue and low energy, to do every day activity's. When I was not taking it I was losing weight at a steady rate now I have being gain weight ( mostly water) I drink about 1 1/2 liters a day even thought I being eat right and walking as much as I can. So can I get my meds changed to Diltiazem 120mg  24hr, and 40mg  of Lasix QD. My friend told me it change her life. Please consider, this I'm not dealing well with the Metoprolol.        Feels like he is retaining some fluid (in his abdomen, fingers). No LE edema unless he is on his feet for extended period. Endorses some orthopnea, feels more comfortable with 45 degree angle sleeping. Drinks plenty of fluids.  No palpitations on the metop, no recent chest pain.    Breathing still feels the same, labored.  Had to cancel PFTs due to family emergency, then has been having difficulty rescheduling, has not heard back despite leaving messages.    Has noted some muscle aches with pravastatin but overall tolerating well.    PMH reviewed/UTD.    Patient Active Problem List   Diagnosis    Anxiety    Palpitations    ETOH abuse    PTSD (post-traumatic stress disorder)    Transaminitis     Outpatient Medications Prior to Visit   Medication Sig Dispense Refill    ALPRAZolam (XANAX) 1 MG tablet Take 1 tablet (1 mg) by mouth 3 times daily as needed for Anxiety. 12 tablet 0    fluticasone propionate (FLONASE) 50 MCG/ACT nasal spray Spray 2 sprays into each nostril daily. 1 bottle 11    lisinopril (PRINIVIL, ZESTRIL) 10 MG tablet Take 1 tablet by mouth daily. 30 tablet 5    Metoprolol Tartrate  37.5 MG TABS Take 37.5 mg by mouth 2 times daily. 60 tablet 2    omeprazole (PRILOSEC) 20 MG capsule Take by mouth.      pravastatin (PRAVACHOL) 40 MG tablet Take 1 tablet (40 mg) by mouth daily. 30 tablet 2     No facility-administered medications prior to visit.      Immunization History   Administered Date(s) Administered    Influenza Vaccine >=3 Years 10/04/2012, 11/06/2014, 09/03/2015, 09/15/2016    Pneumococcal 23 Vaccine (PNEUMOVAX-23) 11/06/2014, 09/15/2016    Tdap 09/15/2016    Tuberculin PPD 11/29/2014     Allergies   Allergen Reactions    Compazine Anxiety    Latex Rash       Objective:  Vitals:    02/24/17 1128   BP: 145/83   BP Location: Left arm   BP Patient Position: Sitting   BP cuff size: Regular   Pulse: 85   Resp: 18   Temp: 97.8 F (36.6 C)   TempSrc: Oral   SpO2: 94%   Weight: 113.9 kg (251 lb)   Height: 6' (1.829 m)     Estimated body mass index is 34.04 kg/(m^2) as  calculated from the following:    Height as of this encounter: 6' (1.829 m).    Weight as of this encounter: 113.9 kg (251 lb).     Wt Readings from Last 5 Encounters:   02/24/17 113.9 kg (251 lb)   01/28/17 109.8 kg (242 lb)   01/07/17 109.3 kg (241 lb)   12/09/16 108 kg (238 lb)   05/29/16 112.5 kg (248 lb)     Physical Examination  General Appearance: healthy, alert, no distress, pleasant affect, cooperative.  Heart:  normal rate and regular rhythm, no murmurs, clicks, or gallops.  Lungs: Decreased breath sounds at B/L bases, otherwise clear to auscultation , respirations unlabored  Abdomen: Abdomen soft, non-tender, non-distended. No masses or organomegaly. Bowel sounds normal. Shifting dullness, ?fluid wave  Extremities:  no cyanosis, clubbing, or edema.    Labs reviewed:   Ref. Range 10/19/2014 18:22 09/03/2015 14:50 12/09/2016 10:55   ALT (SGPT) Latest Ref Range: 0 - 41 U/L 367 (H) 320 (H) 129 (H)   AST (SGOT) Latest Ref Range: 0 - 40 U/L 388 (H) 405 (H) 146 (H)         Assessment: Brett Palmer is a 51 year old male  with the following issues addressed:    Plan:  1. Transaminitis  Presumably due to alcohol use and fatty liver.  Trend liver enzymes.  Continue statin for now as tolerated.  - Liver Panel, Blood Green Plasma Separator Tube; Future  - Prothrombin Time, Blood Blue; Future  - CBC w/ Diff Lavender; Future    2. Fluid retention  9 lb weight gain in the last month, likely due to fluid.  Recent echo without any evidence of CHF.  Will recheck BNPP, and do trial of diuretic.  Will also check on liver function tests to ensure no worsening of synthetic function that might be contributing to a mild ascites.  Counseled on signs of fluid overload to monitor for, and encouraged patient to continue to follow weights regularly  - furosemide (LASIX) 20 MG tablet; Take 1 tablet (20 mg) by mouth 2 times daily.  Dispense: 30 tablet; Refill: 0  - Pro Bnp, Blood Green Plasma Separator Tube; Future    3. Palpitations  Will trial switch of rate control agent from metoprolol to to diltiazem to see if patient tolerates better.  Precautions given for any escalation in palpitations.  - diltiazem (CARDIZEM CD-24HR) 120 MG ER capsule; Take 1 capsule (120 mg) by mouth daily.  Dispense: 30 capsule; Refill: 0    4. Myalgia  Presumably statin induced, recommend trial of coenzyme Q10 to see if symptoms improve.    5. Dyspnea, unspecified type  Patient still with O2 sat on the low side.  His last peak flow at previous visit was appropriate, so will defer trial of albuterol inhaler.  Will assist patient with scheduling of PFT, and if unremarkable, will plan for CT chest in the future to evaluate further.    6.  Essential hypertension  BP slightly elevated above goal today, recommend monitoring periodically.  Suspect this will improve with addition of diuretic agent.    Return in about 1 month (around 03/27/2017) for follow-up meds.    [name], MD    Patient Instruction:   Medications reviewed with patient and medication list reconciled.      Patient's understanding and response to medications assessed.   Barriers to medications assessed and addressed.   Risks, benefits, alternatives to medications reviewed.   Barriers to  Learning assessed: none. Patient verbalizes understanding of teaching and instructions.

## 2017-02-24 NOTE — Patient Instructions (Addendum)
Labs on your way out    Switch from metoprolol to diltiazem, let me know if you get any worsening palpitations    Start Lasix    Follow daily weights    Try scheduling pulmonary function tests through the [facility] branch. Let us know if you are still having difficulty getting scheduled.    Based on your last peak flow, you do not meet criteria for an albuterol inhaler at this time.  We can revisit in the future.

## 2017-02-24 NOTE — Interdisciplinary (Signed)
Pre-visit chart review and huddle completed with staff and physician.    Outstanding labs, imaging and consults reviewed and identified.    Health maintanence issues identified and addressed:    Health Maintenance   Topic Date Due    COLON CANCER SCREENING WITH FIT  01/06/2018    IMM_TD/TDAP=>51 YO  09/15/2026    INFLUENZA VACCINE  Completed

## 2017-02-24 NOTE — Telephone Encounter (Signed)
Called PFT regarding message below , spoke to Raquel and stated the next appt available is April. She said she will call patient today to schedule an appointment.

## 2017-02-24 NOTE — Telephone Encounter (Signed)
Patient reports difficulty rescheduling his pulmonary function test, despite leaving messages.  Early Chars, please call and help facilitate scheduling.    [facility]:   [facility]: [place]: [phone]

## 2017-03-01 ENCOUNTER — Encounter (INDEPENDENT_AMBULATORY_CARE_PROVIDER_SITE_OTHER): Payer: Self-pay | Admitting: Family Medicine

## 2017-03-09 ENCOUNTER — Other Ambulatory Visit (INDEPENDENT_AMBULATORY_CARE_PROVIDER_SITE_OTHER): Payer: Self-pay | Admitting: Family Medicine

## 2017-03-09 DIAGNOSIS — E785 Hyperlipidemia, unspecified: Principal | ICD-10-CM

## 2017-03-09 DIAGNOSIS — R609 Edema, unspecified: Secondary | ICD-10-CM

## 2017-03-09 MED ORDER — FUROSEMIDE 20 MG OR TABS
20.0000 mg | ORAL_TABLET | Freq: Two times a day (BID) | ORAL | 0 refills | Status: DC
Start: 2017-03-09 — End: 2017-04-08

## 2017-03-09 NOTE — Telephone Encounter (Signed)
Last visit in this department 02/24/2017  Next visit in this department 03/30/2017    Last labs:   Lab Results   Component Value Date    CHOL 232 09/03/2015    HDL 51 09/03/2015    LDLCALC 158 09/03/2015    TRIG 115 09/03/2015    TSH 3.57 12/09/2016    A1C 5.6 09/03/2015       Blood Pressure   02/24/17 145/83   01/28/17 135/86   01/07/17 116/76     HM Items that are overdue or due soon include:  There are no preventive care reminders to display for this patient.  Allergies   Allergen Reactions    Compazine Anxiety    Latex Rash     Outpatient Medications Prior to Visit   Medication Sig Dispense Refill    ALPRAZolam (XANAX) 1 MG tablet Take 1 tablet (1 mg) by mouth 3 times daily as needed for Anxiety. 12 tablet 0    diltiazem (CARDIZEM CD-24HR) 120 MG ER capsule Take 1 capsule (120 mg) by mouth daily. 30 capsule 0    fluticasone propionate (FLONASE) 50 MCG/ACT nasal spray Spray 2 sprays into each nostril daily. 1 bottle 11    furosemide (LASIX) 20 MG tablet Take 1 tablet (20 mg) by mouth 2 times daily. 30 tablet 0    lisinopril (PRINIVIL, ZESTRIL) 10 MG tablet Take 1 tablet by mouth daily. 30 tablet 5    Metoprolol Tartrate 37.5 MG TABS Take 37.5 mg by mouth 2 times daily. 60 tablet 2    omeprazole (PRILOSEC) 20 MG capsule Take by mouth.      pravastatin (PRAVACHOL) 40 MG tablet Take 1 tablet (40 mg) by mouth daily. 30 tablet 2     No facility-administered medications prior to visit.      Patient Active Problem List   Diagnosis    Hypertensive disorder    Anxiety    Palpitations    ETOH abuse    PTSD (post-traumatic stress disorder)    Transaminitis     Queen Blossom, LVN/LPN

## 2017-03-09 NOTE — Telephone Encounter (Signed)
From: Jackalyn Lombard  To: [name], MD  Sent: 03/09/2017 10:45 AM PDT  Subject: Medication Renewal Request    Original authorizing provider: [name], MD    Jackalyn Lombard would like a refill of the following medications:  pravastatin (PRAVACHOL) 40 MG tablet [name], MD]  furosemide (LASIX) 20 MG tablet [name], MD]    Preferred pharmacy: [facility], [place] AT [place]:

## 2017-03-20 ENCOUNTER — Ambulatory Visit
Admission: RE | Admit: 2017-03-20 | Discharge: 2017-03-20 | Disposition: A | Discharge status: Home / Self Care | Payer: Commercial Managed Care - HMO | Source: Ambulatory Visit | Attending: Family Medicine | Admitting: Family Medicine

## 2017-03-20 DIAGNOSIS — R0600 Dyspnea, unspecified: Principal | ICD-10-CM | POA: Insufficient documentation

## 2017-03-20 DIAGNOSIS — R942 Abnormal results of pulmonary function studies: Secondary | ICD-10-CM

## 2017-03-20 MED ORDER — ALBUTEROL SULFATE 108 (90 BASE) MCG/ACT IN AERS
4.00 | INHALATION_SPRAY | Freq: Once | RESPIRATORY_TRACT | Status: AC
Start: 2017-03-20 — End: 2017-03-20
  Administered 2017-03-20: 4 via RESPIRATORY_TRACT
  Filled 2017-03-20: qty 6.7

## 2017-03-20 NOTE — Procedures (Signed)
No note

## 2017-03-30 ENCOUNTER — Encounter (INDEPENDENT_AMBULATORY_CARE_PROVIDER_SITE_OTHER): Payer: Commercial Managed Care - HMO | Admitting: Family Medicine

## 2017-04-08 ENCOUNTER — Other Ambulatory Visit (INDEPENDENT_AMBULATORY_CARE_PROVIDER_SITE_OTHER): Payer: Self-pay | Admitting: Family Medicine

## 2017-04-08 DIAGNOSIS — R609 Edema, unspecified: Principal | ICD-10-CM

## 2017-04-08 NOTE — Telephone Encounter (Signed)
Patient is requesting refill of furosemide   Last refill: 03/09/17  Pharmacy: [facility]   Last visit in this department 02/24/2017  Next visit in this department Visit date not found    Last labs:   Lab Results   Component Value Date    CHOL 232 (H) 09/03/2015    HDL 51 09/03/2015    LDLCALC 158 09/03/2015    TRIG 115 09/03/2015    TSH 3.57 12/09/2016    A1C 5.6 09/03/2015       Blood Pressure   02/24/17 145/83   01/28/17 135/86   01/07/17 116/76     HM Items that are overdue or due soon include:  There are no preventive care reminders to display for this patient.

## 2017-04-13 MED ORDER — FUROSEMIDE 20 MG OR TABS
20.0000 mg | ORAL_TABLET | Freq: Two times a day (BID) | ORAL | 0 refills | Status: DC
Start: 2017-04-13 — End: 2017-04-30

## 2017-04-14 ENCOUNTER — Other Ambulatory Visit (INDEPENDENT_AMBULATORY_CARE_PROVIDER_SITE_OTHER): Payer: Self-pay | Admitting: Family Medicine

## 2017-04-14 DIAGNOSIS — I10 Essential (primary) hypertension: Principal | ICD-10-CM

## 2017-04-14 NOTE — Telephone Encounter (Signed)
Last visit in this department 02/24/2017  Next visit in this department Visit date not found    Last labs:   Lab Results   Component Value Date    CHOL 232 (H) 09/03/2015    HDL 51 09/03/2015    LDLCALC 158 09/03/2015    TRIG 115 09/03/2015    TSH 3.57 12/09/2016    A1C 5.6 09/03/2015       Blood Pressure   02/24/17 145/83   01/28/17 135/86   01/07/17 116/76     HM Items that are overdue or due soon include:  There are no preventive care reminders to display for this patient.  Allergies   Allergen Reactions    Compazine Anxiety    Latex Rash       Queen Blossom, LVN/LPN

## 2017-04-15 ENCOUNTER — Other Ambulatory Visit (INDEPENDENT_AMBULATORY_CARE_PROVIDER_SITE_OTHER): Payer: Self-pay

## 2017-04-15 ENCOUNTER — Encounter (INDEPENDENT_AMBULATORY_CARE_PROVIDER_SITE_OTHER): Payer: Self-pay | Admitting: Family Medicine

## 2017-04-15 MED ORDER — LISINOPRIL-HYDROCHLOROTHIAZIDE 10-12.5 MG OR TABS
ORAL_TABLET | ORAL | 0 refills | Status: DC
Start: 2017-02-24 — End: 2017-04-17

## 2017-04-15 NOTE — Telephone Encounter (Signed)
[facility] requesting fill on Lisinopril/HCTZ. Not under current med list.  Please advice.

## 2017-04-17 MED ORDER — LISINOPRIL 10 MG OR TABS
10.0000 mg | ORAL_TABLET | Freq: Every day | ORAL | 5 refills | Status: DC
Start: 2017-04-17 — End: 2017-08-23

## 2017-04-17 NOTE — Telephone Encounter (Signed)
Pt is on lisinopril, refilled. Should not be on lisinopril/HCTZ, please notify pharmacy

## 2017-04-20 NOTE — Telephone Encounter (Signed)
Pharmacy notified.  Rosiland Oz.

## 2017-04-30 ENCOUNTER — Encounter (INDEPENDENT_AMBULATORY_CARE_PROVIDER_SITE_OTHER): Payer: Self-pay | Admitting: Family Medicine

## 2017-04-30 ENCOUNTER — Encounter (INDEPENDENT_AMBULATORY_CARE_PROVIDER_SITE_OTHER): Payer: Self-pay

## 2017-04-30 ENCOUNTER — Ambulatory Visit (INDEPENDENT_AMBULATORY_CARE_PROVIDER_SITE_OTHER): Payer: Commercial Managed Care - HMO | Admitting: Family Medicine

## 2017-04-30 VITALS — BP 124/76 | HR 60 | Temp 97.6°F | Resp 18 | Ht 72.0 in | Wt 257.0 lb

## 2017-04-30 DIAGNOSIS — R0600 Dyspnea, unspecified: Principal | ICD-10-CM

## 2017-04-30 DIAGNOSIS — J449 Chronic obstructive pulmonary disease, unspecified: Secondary | ICD-10-CM

## 2017-04-30 DIAGNOSIS — E785 Hyperlipidemia, unspecified: Secondary | ICD-10-CM

## 2017-04-30 DIAGNOSIS — R002 Palpitations: Secondary | ICD-10-CM

## 2017-04-30 DIAGNOSIS — I10 Essential (primary) hypertension: Secondary | ICD-10-CM

## 2017-04-30 DIAGNOSIS — E669 Obesity, unspecified: Secondary | ICD-10-CM

## 2017-04-30 DIAGNOSIS — R7401 Elevation of levels of liver transaminase levels: Secondary | ICD-10-CM

## 2017-04-30 DIAGNOSIS — R740 Nonspecific elevation of levels of transaminase and lactic acid dehydrogenase [LDH]: Secondary | ICD-10-CM

## 2017-04-30 DIAGNOSIS — R609 Edema, unspecified: Secondary | ICD-10-CM

## 2017-04-30 MED ORDER — PRAVASTATIN SODIUM 40 MG OR TABS
40.0000 mg | ORAL_TABLET | Freq: Every day | ORAL | 2 refills | Status: DC
Start: 2017-04-30 — End: 2017-08-23

## 2017-04-30 MED ORDER — DILTIAZEM HCL COATED BEADS 120 MG OR CP24
120.0000 mg | ORAL_CAPSULE | Freq: Every day | ORAL | 2 refills | Status: DC
Start: 2017-04-30 — End: 2018-06-11

## 2017-04-30 MED ORDER — BECLOMETHASONE DIPROPIONATE 80 MCG/ACT IN AERS
1.0000 | INHALATION_SPRAY | Freq: Two times a day (BID) | RESPIRATORY_TRACT | 2 refills | Status: DC
Start: 2017-04-30 — End: 2018-06-11

## 2017-04-30 MED ORDER — FUROSEMIDE 20 MG OR TABS
20.0000 mg | ORAL_TABLET | Freq: Two times a day (BID) | ORAL | 2 refills | Status: DC
Start: 2017-04-30 — End: 2017-08-23

## 2017-04-30 MED ORDER — ALBUTEROL SULFATE 108 (90 BASE) MCG/ACT IN AERS
2.0000 | INHALATION_SPRAY | Freq: Four times a day (QID) | RESPIRATORY_TRACT | 2 refills | Status: DC | PRN
Start: 2017-04-30 — End: 2019-12-07

## 2017-04-30 NOTE — Progress Notes (Signed)
Family Medicine Clinic Progress Note    CC:   Chief Complaint   Patient presents with    Refill Request    Results     pft         Subjective: Brett Palmer is a 51 year old male who is here for chief complaint above.    Still experiences dyspnea even at rest, worse with exertion  Borrows his wife's inhaler and it does improve about 50%  Has been monitoring O2 sat with a special app ([employer]) and it is usually in 90s but occas high 80s when sleeping  Even last night felt like his lungs "sounded wet" like rhonchi but otherwise does hear audible wheezing periodically.  Pt has had a number of coworkers diagnosed with cancer and is worried there may have been some exposure at work that precipitated these symptoms    Generally no palpitations unless he is feeling anxiety/stress- recent stress with his 6yo grandson moving in with them. Has been drinking about the same amount of EtOH    Lasix has been helping with fluid retention    CoQ10 is helping with statin myalgias    ROS otherwise negative    PMH and SH reviewed/updated.    Patient Active Problem List   Diagnosis    Hypertensive disorder    Anxiety    Palpitations    ETOH abuse    PTSD (post-traumatic stress disorder)    Transaminitis       Medications reviewed with patient and medication list reconciled.   Patient's understanding and response to medications assessed.     Outpatient Medications Prior to Visit   Medication Sig Dispense Refill    ALPRAZolam (XANAX) 1 MG tablet Take 1 tablet (1 mg) by mouth 3 times daily as needed for Anxiety. 12 tablet 0    diltiazem (CARDIZEM CD-24HR) 120 MG ER capsule Take 1 capsule (120 mg) by mouth daily. 30 capsule 0    fluticasone propionate (FLONASE) 50 MCG/ACT nasal spray Spray 2 sprays into each nostril daily. 1 bottle 11    furosemide (LASIX) 20 MG tablet Take 1 tablet (20 mg) by mouth 2 times daily. NEED APPT FOR REFILLS 60 tablet 0    lisinopril (PRINIVIL, ZESTRIL) 10 MG tablet Take 1 tablet (10 mg) by mouth  daily. 30 tablet 5    Metoprolol Tartrate 37.5 MG TABS Take 37.5 mg by mouth 2 times daily. 60 tablet 2    omeprazole (PRILOSEC) 20 MG capsule Take 20 mg by mouth daily.        pravastatin (PRAVACHOL) 40 MG tablet Take 1 tablet (40 mg) by mouth daily. 30 tablet 2     No facility-administered medications prior to visit.        Immunization History   Administered Date(s) Administered    Influenza Vaccine >=3 Years 10/04/2012, 11/06/2014, 09/03/2015, 09/15/2016    Pneumococcal 23 Vaccine (PNEUMOVAX-23) 11/06/2014, 09/15/2016    Tdap 09/15/2016    Tuberculin PPD 11/29/2014     Allergies   Allergen Reactions    Compazine Anxiety    Latex Rash       Objective:  Vitals:    04/30/17 1252   BP: 124/76   BP Location: Left arm   BP Patient Position: Sitting   BP cuff size: Large   Pulse: 60   Resp: 18   Temp: 97.6 F (36.4 C)   TempSrc: Oral   SpO2: 100%   Weight: 116.6 kg (257 lb)   Height: 6' (1.829  m)     Estimated body mass index is 34.86 kg/(m^2) as calculated from the following:    Height as of this encounter: 6' (1.829 m).    Weight as of this encounter: 116.6 kg (257 lb).    Physical Exam  General Appearance: healthy, alert, no distress, pleasant affect, cooperative.  Heart:  normal rate and regular rhythm, no murmurs, clicks, or gallops.  Lungs: clear to auscultation, respirations unlabored, no chest deformities noted.  Extremities:  no cyanosis, clubbing, or edema.      The 10-year ASCVD risk score Mikey Bussing DC Latina Craver al., 2013) is: 3.8%    Values used to calculate the score:      Age: 40 years      Sex: Male      Is Non-Hispanic African American: No      Diabetic: No      Tobacco smoker: No      Systolic Blood Pressure: 623 mmHg      Is BP treated: Yes      HDL Cholesterol: 74 mg/dL      Total Cholesterol: 199 mg/dL      Low Risk: 0 - 5 %  Moderate Risk: 5 - 7.5 %  High Risk: 7.5 - 100 %        Studies reviewed:  PFTs 03/20/2017:  The spirometry pattern discloses a borderline obstructive defect in the small  airways. The FEV1 and FVC are within normal  limits but the FEV1/FVC ratio is decreased. The mid-expiratory flow rates are disproportionately decreased and they  correspond to a hyperbolic shape of the expiratory portion of the flow-volume loop.  Lung volumes measured by plethysmography are within normal limits.  The airways resistance, measured during quiet breathing at resting lung volume and adjusted for the lung volume, is within  normal limits.  After the inhalation of bronchodilators, spirometry, lung volumes and airway resistance were measured again. There was no  significant improvement in FEV1 or FVC. There was no convincing improvement in residual volume or airways resistance.  However, the absence of a demonstrable response may not preclude clinical benefit.  The DLCO adjusted to the patient's hemoglobin level is within normal limits.  The Maximum Inspiratory Pressures and Maximum Expiratory Pressures generated by the patient are below the lower limits of  normal. However, in the absence of other findings suggesting neuromuscular weakness, this pattern is nonspecific and of  uncertain clinical significance.      Assessment: Brett Palmer is a 51 year old male with the following issues addressed:    Plan:  1. Dyspnea, unspecified type  Still unclear etiology.  Borderline obstructive defect noted on PFTs, so will try a course of inhaled steroids along with rescue inhaler. Query occupational exposure w/ [place] Syndrome?  Chest CT to evaluate further.  - CT Chest W/O Contrast; Future    2. Palpitations  Well controlled with switch from metoprolol to diltiazem  - diltiazem (CARDIZEM CD-24HR) 120 MG ER capsule; Take 1 capsule (120 mg) by mouth daily.  Dispense: 30 capsule; Refill: 2    3. Fluid retention  Patient reports improvement in symptoms with Lasix  - furosemide (LASIX) 20 MG tablet; Take 1 tablet (20 mg) by mouth 2 times daily.  Dispense: 60 tablet; Refill: 2    4. Hyperlipidemia, unspecified  hyperlipidemia type  Despite overall low ASCVD risk, in the setting of fatty liver with transaminitis and patient's cardiovascular disease, there is a presumed benefit.  Will recheck lipid response to  statin.  - pravastatin (PRAVACHOL) 40 MG tablet; Take 1 tablet (40 mg) by mouth daily.  Dispense: 30 tablet; Refill: 2  - Lipid Panel Green Plasma Separator Tube; Future    5. Transaminitis  Ongoing issue presumably in the context of fatty liver disease and alcohol use.  Trend.  - Comprehensive Metabolic Panel Green Plasma Separator Tube; Future    6. Benign essential HTN  BP at goal with Lasix, lisinopril, and dilt.  Continue current regimen  - Comprehensive Metabolic Panel Green Plasma Separator Tube; Future    7. Obesity, unspecified classification, unspecified obesity type, unspecified whether serious comorbidity present  - Glycosylated Hgb(A1C), Blood Lavender; Future    8. Chronic obstructive pulmonary disease, unspecified COPD type (CMS-HCC)  - beclomethasone (QVAR) 80 MCG/ACT inhaler; Inhale 1 puff by mouth 2 times daily.  Dispense: 1 Inhaler; Refill: 2  - albuterol 108 (90 BASE) MCG/ACT inhaler; Inhale 2 puffs by mouth every 6 hours as needed for Wheezing.  Dispense: 1 Inhaler; Refill: 2      Patient Instructions   Schedule your chest scan  Start inhaled steroid (Qvar) twice daily, rinse out mouth after use  Use albuterol inhaler as rescue only (every 4-6 hours as needed)  Get fasting labs done in a week or two.      Barriers to Learning assessed: none. Patient verbalizes understanding of teaching and instructions.    Return for f/u after CT scan, f/u shortness of breath.    [name], MD

## 2017-04-30 NOTE — Patient Instructions (Signed)
Schedule your chest scan  Start inhaled steroid (Qvar) twice daily, rinse out mouth after use  Use albuterol inhaler as rescue only (every 4-6 hours as needed)  Get fasting labs done in a week or two.

## 2017-05-07 ENCOUNTER — Ambulatory Visit: Payer: Commercial Managed Care - HMO | Attending: Family Medicine | Admitting: Cardiology

## 2017-05-07 ENCOUNTER — Encounter (HOSPITAL_COMMUNITY): Payer: Self-pay | Admitting: Cardiology

## 2017-05-07 VITALS — BP 146/83 | HR 109 | Temp 97.7°F | Resp 16 | Ht 72.0 in | Wt 258.6 lb

## 2017-05-07 DIAGNOSIS — I471 Supraventricular tachycardia: Principal | ICD-10-CM | POA: Insufficient documentation

## 2017-05-07 DIAGNOSIS — R002 Palpitations: Secondary | ICD-10-CM | POA: Insufficient documentation

## 2017-05-07 DIAGNOSIS — I10 Essential (primary) hypertension: Secondary | ICD-10-CM | POA: Insufficient documentation

## 2017-05-07 DIAGNOSIS — R9431 Abnormal electrocardiogram [ECG] [EKG]: Secondary | ICD-10-CM | POA: Insufficient documentation

## 2017-05-07 NOTE — Progress Notes (Signed)
[place] ([place]) [place] PATIENT CONSULTATION    Encounter Date: 05/07/2017    Demographics:  Patient Name: Brett Palmer   Medical Record #: [id number]   DOB: February 04, 1966  Age: 51 year old  Sex: male    Providers:      [place], [place] [facility] Location:      [facility]  [facility]  [address]  [place] [id number]    I had the pleasure of seeing Ardon Franklin for followup consultation at the [place] [facility] on 05/07/2017.     As you know Brett Palmer is a 51 year old male who has a history of AVNRT s/p slow pathway ablation in 2016, HTN, PTSD, GERD and migraine, here for f/u of palpitations.    Had EPS/SVT ablation in 2016.  Prior to that had very fast HR and palpitations with documented termination with Adenosine.  They were severe symptoms.    He no longer has those episodes.  However, he does have SOB.  He is getting workup for lung issues.    When he gets SOB, he also gets some other sensation of palpitations.  Subsequent event monitors have not shown significant arrhythmias.    He will be seeing pulmonary.    The patient denies recent episodes of chest pain,  change in exercise tolerance, swelling in legs, paroxysmal nocturnal dyspnea, orthopnea, syncope, or presyncope.    PAST MEDICAL HISTORY:   Past Medical History:   Diagnosis Date    Gastroesophageal reflux disease     Migraine headache     part of gulf war syndrome    PTSD (post-traumatic stress disorder) 1995-present    treated by Dr. Joen Laura at [facility]     SVT (supraventricular tachycardia) (CMS-HCC) 2011       ALLERGIES:   Compazine and Latex    MEDICATIONS:   Current Outpatient Prescriptions on File Prior to Visit   Medication Sig Dispense Refill    albuterol 108 (90 BASE) MCG/ACT inhaler Inhale 2 puffs by mouth every 6 hours as needed for Wheezing. 1 Inhaler 2    ALPRAZolam (XANAX) 1 MG tablet Take 1 tablet (1 mg) by mouth 3 times daily as needed  for Anxiety. 12 tablet 0    beclomethasone (QVAR) 80 MCG/ACT inhaler Inhale 1 puff by mouth 2 times daily. 1 Inhaler 2    diltiazem (CARDIZEM CD-24HR) 120 MG ER capsule Take 1 capsule (120 mg) by mouth daily. 30 capsule 2    fluticasone propionate (FLONASE) 50 MCG/ACT nasal spray Spray 2 sprays into each nostril daily. 1 bottle 11    furosemide (LASIX) 20 MG tablet Take 1 tablet (20 mg) by mouth 2 times daily. 60 tablet 2    lisinopril (PRINIVIL, ZESTRIL) 10 MG tablet Take 1 tablet (10 mg) by mouth daily. 30 tablet 5    omeprazole (PRILOSEC) 20 MG capsule Take 20 mg by mouth daily.        pravastatin (PRAVACHOL) 40 MG tablet Take 1 tablet (40 mg) by mouth daily. 30 tablet 2     No current facility-administered medications on file prior to visit.        The patient's past medical, family, and social history were reviewed and updated as appropriate.     REVIEW OF SYSTEMS:  General: No weight changes, fevers, or chills.   Eyes: No visual changes.   GI: No melena, bright red blood per rectum, or abdominal pain.  GU: No hematuria.  Pulmonary: No coughing or wheezing.   Musculoskeletal: No myalgias or arthralgias.   Hematologic: No easy bruising or abnormal bleeding.   Endocrine: Normal.   Neurologic: No new headaches, focal weakness, numbness, or tingling.    The remainder of systems were reviewed and are normal or as per HPI.     PHYSICAL EXAMINATION:  BP 146/83 (BP Location: Left arm, BP Patient Position: Sitting, BP cuff size: Large)   Pulse 109   Temp 97.7 F (36.5 C) (Oral)   Resp 16   Ht 6' (1.829 m)   Wt 117.3 kg (258 lb 9.6 oz)   SpO2 95%   BMI 35.07 kg/m2  Body mass index is 35.07 kg/(m^2).  GENERAL APPEARANCE: Average build and nutrition, no apparent distress noted.  HEAD: Normocephalic, atraumatic.   EYES: Normal conjunctivae, no scleral icterus. EOMI.  NECK: No thyromegaly or lymphadenopathy.  RESPIRATORY: Clear to auscultation bilaterally with no crackles or wheezes, normal respiratory  effort.  CARDIOVASCULAR: Apical impulse non-sustained and non-displaced.  Regular rate and rhythm; normal S1, S2; no murmurs, rubs, or gallops; jugular venous pressure normal, no hepatojugular reflux. Carotid pulse is 2+, no bruits.   GI/ABDOMEN: soft, non-tender; no hepatosplenomegaly; normal bowel sounds.  EXTREMITIES: No edema. 2+ DP and PT pulses bilaterally. No clubbing or cyanosis.  SKIN: No rash. Warm, well-perfused.    NEUROLOGIC: Alert and oriented X 3.   Grossly non-focal.  PSYCHIATRIC: Normal speech and affect.     DATA ANALYSIS:   ECG today in clinic: My interpretation of the ECG today shows NSR, anterior t wave inversion    Lab Results   Component Value Date    WBC 6.4 02/24/2017    RBC 4.64 02/24/2017    HGB 14.9 02/24/2017    HCT 43.1 02/24/2017    MCV 92.9 02/24/2017    MCH 32.1 (H) 02/24/2017    MCHC 34.6 02/24/2017    PLT 157 02/24/2017     Lab Results   Component Value Date    CREAT 0.73 12/09/2016    BUN 8 12/09/2016    NA 138 12/09/2016    K 4.6 12/09/2016    CL 98 12/09/2016    BICARB 25 12/09/2016     Lab Results   Component Value Date    CHOL 232 (H) 09/03/2015    HDL 51 09/03/2015    LDLCALC 158 09/03/2015    TRIG 115 09/03/2015     Last Stress Test: Exercise echo 05/28/11:  Resting Findings  Supine BP:104/83 Standing BP:88/63 HR:90 Rhythm:Sinus    Exercise Findings  The patient exercised 8 minutes and 10 seconds on the Bruce protocol  (13 METS) achieving a maximum heart rate of 175 bpm (100% of predicted)  and a maximal blood pressure of 177/78 mm Hg.  Exercise was stopped because of maximal effort.  There was no chest pain during exercise.    ECG Findings  Resting ECG: Nonspecific ST/T wave abnormalities  Exercise ECG: There were only minor nondiagnostic ST-T wave changes.    Duke treadmill score = 8, Low Risk.    Echo Findings  An LV contrast agent was used to improve endocardial definition.  Resting Echo: No regional wall motion abnormalities are identified.   Exercise Echo: There was  normal augmentation of all segments with exercise on   only 2 views, apical 4 and apical 2 chamber views. Poor cardiac windows in the  parasternal views.    Conclusions  1) Negative for ischemia.  2)  Excellent exercise capacity for the patient's age.  3) Normal blood pressure response to exercise.  4) Normal heart rate response to exercise     Event monitor 12/11/12:  [facility] Cardiac Outpatient Telemetry was performed from  11/25/2012 - 12/02/2012 and showed no arrhythmias or pauses.   Normal sinus rhythm throughout the monitoring period. No atrial  fibrillation or flutter. Rare premature ventricular contractions  noted. Patient reported symptoms of "light-headed", "Fatigue",  "heart racing" generally correlated with normal sinus rhythm at  84-91 bpm.      Event 2/18  The patient's monitoring period was 12/31/2016 - 01/13/2017. Baseline sample showed Sinus Rhythm w/Artifact with a heart rate of 78.4 bpm. There were 1  critical, 0 serious, and 7 stable events that occurred. The report analysis of the critical, serious, stable and manually triggered events are listed below.  Manually Detected Events:  1 Stable: Sinus Rhythm   None or Accidental Push  1 Stable: Sinus Rhythm w/PVCs   Shortness of Breath; Light-Headedness  1 Stable: Sinus Rhythm   Tired or Fatigued  1 Stable: Sinus Tachycardia w/PVCs (2 in 1/Min)/Artifact   Shortness of Breath; Tired or Fatigued  1 Stable: Sinus Rhythm   Shortness of Breath  1 Stable: Sinus Rhythm w/Artifact   Shortness of Breath; Tired or Fatigued    Echo 2/18  Summary:  1. Technically difficult study with suboptimal images.  2. The left ventricular size is mildly increased and the left ventricular systolic function is normal.  3. No left ventricular hypertrophy.  4. The right ventricular size is mildly enlarged and systolic function is normal.  5. No significant valvular abnormalities.  6. Rcommend use of IV contrast agent to better identify endocardial borders if  clinically indicated.  7. Compared to prior study no significant change.    Nuc stress 1/18  IMPRESSION:  1. Normal LV myocardial perfusion. No evidence of inducible ischemia or prior transmural myocardial infarctions.    2. Normal LV chamber size and systolic function.    3. Average exercise capacity. Normal hemodynamic response to exercise. Prolonged tachycardia during recovery with transient upper back pain and shortness of breath.    4. Overall low risk for hard cardiac events.      ASSESSMENT AND PLAN:  KYLLE LALL is a 51 year old male with the following issues:    #history of AVNRT s/p slow pathway ablation in 2016-  He does have some remaining palpitations, but these appear related to his lung disorder.  Subsequent event monitors have not revealed any significant arrhythmia.  I mainly provided reassurance that his arrhythmia is unlikely to recur.  -F/u with EP PRN    #Abnomral PFTs-  Likely the etiology of his symptoms.  He will see pulmonary.    I spent a total of 20 minutes face-to-face with the patient and more than half of that time was spent counseling regarding management options for his condition.      Return if symptoms worsen or fail to improve.    Thank you for allowing me to participate in the care of this patient.    Elesa Massed, MD, MAS

## 2017-05-08 LAB — ECG 12-LEAD
ATRIAL RATE: 99 {beats}/min
ECG INTERPRETATION: NORMAL
P AXIS: 28 degrees
PR INTERVAL: 156 ms
QRS INTERVAL/DURATION: 94 ms
QT: 332 ms
QTC INTERVAL: 426 ms
R AXIS: 70 degrees
T AXIS: -14 degrees
VENTRICULAR RATE: 99 {beats}/min

## 2017-05-13 ENCOUNTER — Ambulatory Visit
Admission: RE | Admit: 2017-05-13 | Discharge: 2017-05-13 | Disposition: A | Discharge status: Home / Self Care | Payer: Commercial Managed Care - HMO | Attending: Neuroradiology | Admitting: Neuroradiology

## 2017-05-13 DIAGNOSIS — I428 Other cardiomyopathies: Secondary | ICD-10-CM | POA: Insufficient documentation

## 2017-05-13 DIAGNOSIS — R0600 Dyspnea, unspecified: Secondary | ICD-10-CM | POA: Insufficient documentation

## 2017-05-14 ENCOUNTER — Telehealth (INDEPENDENT_AMBULATORY_CARE_PROVIDER_SITE_OTHER): Payer: Self-pay | Admitting: Family Medicine

## 2017-05-14 DIAGNOSIS — R0600 Dyspnea, unspecified: Principal | ICD-10-CM

## 2017-05-14 NOTE — Telephone Encounter (Signed)
CT chest was unremarkable apart from fatty infiltration of RV wall which could be suggestive of ARVD, which could explain his ongoing intermittent palpitations. Will forward to cardiology as FYI for any other recs, but I believe the first-line management would just be continuing the rate control which has been working well.     Attempted to call Pt with results but no answer, left VM to check MyChart.    Will also forward imaging on to Dr. Rigoberto Noel as Juluis Rainier and for any further input.

## 2017-05-15 ENCOUNTER — Encounter (INDEPENDENT_AMBULATORY_CARE_PROVIDER_SITE_OTHER): Payer: Self-pay | Admitting: Family Medicine

## 2017-05-15 ENCOUNTER — Encounter (HOSPITAL_COMMUNITY): Payer: Self-pay

## 2017-05-15 ENCOUNTER — Telehealth (HOSPITAL_COMMUNITY): Payer: Self-pay | Admitting: Nurse Practitioner

## 2017-05-15 DIAGNOSIS — R002 Palpitations: Principal | ICD-10-CM

## 2017-05-15 NOTE — Telephone Encounter (Signed)
CT chest ordered by pulmonary reveals possible RV fatty infiltrate. Dr. Rigoberto Noel reviewed study and is recommending MRI to fully exclude ARVD. Phoned pt to discuss, left message asking him to call us back.

## 2017-05-15 NOTE — Telephone Encounter (Signed)
From: Jackalyn Lombard  To: [name], MD  Sent: 05/15/2017 9:42 AM PDT  Subject: 20-Other    After reading up on ARVD I now understand a lot more and will do more to lose weight and get more low impacted excise like swimming.    Thanks Again [employer].  [name] [facility]

## 2017-05-18 ENCOUNTER — Telehealth (HOSPITAL_COMMUNITY): Payer: Self-pay | Admitting: Nurse Practitioner

## 2017-05-18 NOTE — Telephone Encounter (Signed)
Spoke with patient regarding CT results. He is agreeable to have cardiac MRI. Scheduling number given. Will follow-up on results.

## 2017-05-19 ENCOUNTER — Encounter (INDEPENDENT_AMBULATORY_CARE_PROVIDER_SITE_OTHER): Payer: Self-pay

## 2017-05-19 ENCOUNTER — Telehealth (HOSPITAL_COMMUNITY): Payer: Self-pay | Admitting: Cardiology

## 2017-05-19 ENCOUNTER — Telehealth (INDEPENDENT_AMBULATORY_CARE_PROVIDER_SITE_OTHER): Payer: Self-pay | Admitting: Family Medicine

## 2017-05-19 NOTE — Telephone Encounter (Signed)
Message routed to referral coordinator for the code.

## 2017-05-19 NOTE — Telephone Encounter (Signed)
[facility] form [employer] called requesting for the diagnosis code and procedure code for DNA Comprehensive Metabolic Panel.     She said patient called them and advised that Dr Salvadore Dom ordered the test  and want them to find out if this test is covered under the insurance.     She requested to call the patient for the response due to she does not have a callback number at the Call Center.     Please advise.

## 2017-05-19 NOTE — Telephone Encounter (Signed)
Very much appreciate Dr. Pat Patrick prompt input in setting of pt's [place] psychiatrist being unavailable;      Left detailed vm as below with ED precautions about ANY preciptious increase in HR, SOB, palpitations, desire to hurt himself/others.   Asked him to call with questions and follow up with [place] about how he's doing and call TODAY and make first appt with psych upon Md's return.

## 2017-05-19 NOTE — Telephone Encounter (Signed)
Mr. Koeppen just say Dr. Rigoberto Noel on 6/7 with this assessment and plan as below, with no med changes.    He says he's been on Effexor 37.5 mg for approximately one week.  For the last four or five days, he's had an elevated HR and sob as described below.  He thinks it's definitely a SE of the Effexor.  He calls here b/c of the nature of the side effect and because his [place] psychiatrist is apparently in [place] and he's been told "there's no one covering for him, you have to go to the emergency psych clinic."  He can't currently drive.    He does say that Dr. Salvadore Dom is his primary MD.      We agreed that I would get this information to both his electrophysiology team and Dr. Salvadore Dom, and one of them may be comfortable advising him whether to d/c effexor abruptly given the SE b/c he's only been on it briefly, or titrate it down by cutting it in half for a couple of days then stopping to see if his symptoms resolve.       His next appointment with his [place] psychiatrist is not until September.  I asked him to call the clinic back and get an appointment for as soon as his physician returns stateside, because if he is off his medication he should follow up promptly so he can be trialed on another SSRI or SNRI as appropriate.    ASSESSMENT AND PLAN:  GENARO BEKKER is a 51 year old male with the following issues:    #history of AVNRT s/p slow pathway ablation in 2016-  He does have some remaining palpitations, but these appear related to his lung disorder.  Subsequent event monitors have not revealed any significant arrhythmia.  I mainly provided reassurance that his arrhythmia is unlikely to recur.  -F/u with EP PRN    #Abnomral PFTs-  Likely the etiology of his symptoms.  He will see pulmonary.    I spent a total of 20 minutes face-to-face with the patient and more than half of that time was spent counseling regarding management options for his condition.      Return if symptoms worsen or fail to improve.

## 2017-05-19 NOTE — Telephone Encounter (Signed)
Patient is calling to inform for the past 5 days he has been having elevated heart rate up to 150 while walking which causes him to get SOB and fatigue. Patient would like to be contacted to discuss the current medications he is taking. Please advise

## 2017-05-19 NOTE — Telephone Encounter (Signed)
Effexor does have a potential SE of palpitations. Please have Pt take the pill every other day for the next week and then off, to minimize discontinuation side effects. F/u with psych for ongoing med management. If Pt still has palpitations off the Effexor, should schedule with Dr. Rigoberto Noel for f/u. Please review ED precautions.

## 2017-05-21 NOTE — Telephone Encounter (Signed)
Called [employer] to get further information and spoke with Vilma per Vilma there was no documentation about [place] call from below. Per [place] code [phone] for comprehensive metabolic panel does not require authorization, and is covered by insurance. Will call pt tomorrow to advise him.   Call ref # [id number]

## 2017-05-22 ENCOUNTER — Telehealth (INDEPENDENT_AMBULATORY_CARE_PROVIDER_SITE_OTHER): Payer: Self-pay | Admitting: Pulmonary Disease

## 2017-05-22 NOTE — Telephone Encounter (Signed)
Called pt no answer but left pt a voice message to return call.

## 2017-05-22 NOTE — Telephone Encounter (Signed)
Patient is scheduled for consult with Dr. Clelia Schaumann on 07/10 at 37am for 51 year old male with ongoing dyspnea of unclear etiology with occasionally low O2 sat (high 80s/low 90s), negative CT chest, normal PFTs    Please mail new pt questionnaire     Referring Provider:  [name], MD     Patients PCP (update PCP on chart if needed):     Salvadore Dom Cleta Alberts, MD  Was Auth/referral entered  Yes    Internal or External Referral: Internal       * If Internal: route to MD's hospital tech        * If external: route to CC scanner      Was patient informed to bring any outside CD imaging (if available) to consult? Ct Chest done 06/13

## 2017-05-26 NOTE — Telephone Encounter (Signed)
Called pt again to rely information below. No answer. If pt calls back call center agent can rely message below to pt. Thank you.

## 2017-05-29 ENCOUNTER — Other Ambulatory Visit: Payer: Commercial Managed Care - HMO | Attending: Family Medicine

## 2017-05-29 DIAGNOSIS — R7401 Elevation of levels of liver transaminase levels: Secondary | ICD-10-CM

## 2017-05-29 DIAGNOSIS — R740 Nonspecific elevation of levels of transaminase and lactic acid dehydrogenase [LDH]: Principal | ICD-10-CM | POA: Insufficient documentation

## 2017-05-29 DIAGNOSIS — E669 Obesity, unspecified: Secondary | ICD-10-CM | POA: Insufficient documentation

## 2017-05-29 DIAGNOSIS — I10 Essential (primary) hypertension: Secondary | ICD-10-CM | POA: Insufficient documentation

## 2017-05-29 DIAGNOSIS — E785 Hyperlipidemia, unspecified: Secondary | ICD-10-CM | POA: Insufficient documentation

## 2017-05-29 LAB — COMPREHENSIVE METABOLIC PANEL, BLOOD
ALT (SGPT): 194 U/L — ABNORMAL HIGH (ref 0–41)
AST (SGOT): 241 U/L — ABNORMAL HIGH (ref 0–40)
Albumin: 4.6 g/dL (ref 3.5–5.2)
Alkaline Phos: 112 U/L (ref 40–129)
Anion Gap: 13 mmol/L (ref 7–15)
BUN: 8 mg/dL (ref 6–20)
Bicarbonate: 29 mmol/L (ref 22–29)
Bilirubin, Tot: 0.62 mg/dL (ref <1.2)
Calcium: 9.5 mg/dL (ref 8.5–10.6)
Chloride: 96 mmol/L — ABNORMAL LOW (ref 98–107)
Creatinine: 0.73 mg/dL (ref 0.67–1.17)
GFR: >60 mL/min
Glucose: 108 mg/dL — ABNORMAL HIGH (ref 70–99)
Potassium: 4.2 mmol/L (ref 3.5–5.1)
Sodium: 138 mmol/L (ref 136–145)
Total Protein: 7.7 g/dL (ref 6.0–8.0)

## 2017-05-29 LAB — LIPID(CHOL FRACT) PANEL, BLOOD
Cholesterol: 199 mg/dL (ref <200)
HDL-Cholesterol: 74 mg/dL
LDL-Chol (Calc): 101 mg/dL (ref <160)
Non-HDL Cholesterol: 125 mg/dL
Triglycerides: 118 mg/dL (ref 10–170)

## 2017-05-29 LAB — GLYCOSYLATED HGB(A1C), BLOOD: Glyco Hgb (A1C): 5.6 % (ref 4.8–5.8)

## 2017-05-29 NOTE — Interdisciplinary (Signed)
Blood drawn from left arm with 21 gauge needle. 2 tubes taken.   Patient identity authenticated by [facility] [place].

## 2017-06-01 ENCOUNTER — Ambulatory Visit
Admission: RE | Admit: 2017-06-01 | Discharge: 2017-06-01 | Disposition: A | Discharge status: Home / Self Care | Payer: Commercial Managed Care - HMO | Attending: Radiology | Admitting: Radiology

## 2017-06-01 DIAGNOSIS — I471 Supraventricular tachycardia: Secondary | ICD-10-CM | POA: Insufficient documentation

## 2017-06-01 DIAGNOSIS — R002 Palpitations: Principal | ICD-10-CM | POA: Insufficient documentation

## 2017-06-01 MED ORDER — GADOBENATE DIMEGLUMINE 529 MG/ML IV SOLN
38.00 mL | Freq: Once | INTRAVENOUS | Status: AC
Start: 2017-06-01 — End: 2017-06-01
  Administered 2017-06-01: 38 mL via INTRAVENOUS
  Filled 2017-06-01: qty 38

## 2017-06-09 ENCOUNTER — Ambulatory Visit (INDEPENDENT_AMBULATORY_CARE_PROVIDER_SITE_OTHER): Payer: Commercial Managed Care - HMO | Admitting: Pulmonary Disease

## 2017-06-09 ENCOUNTER — Encounter (INDEPENDENT_AMBULATORY_CARE_PROVIDER_SITE_OTHER): Payer: Self-pay | Admitting: Pulmonary Disease

## 2017-06-09 VITALS — BP 146/77 | HR 101 | Temp 98.6°F | Resp 18 | Ht 72.0 in | Wt 258.0 lb

## 2017-06-09 DIAGNOSIS — E669 Obesity, unspecified: Secondary | ICD-10-CM

## 2017-06-09 DIAGNOSIS — J45909 Unspecified asthma, uncomplicated: Principal | ICD-10-CM

## 2017-06-09 DIAGNOSIS — J449 Chronic obstructive pulmonary disease, unspecified: Secondary | ICD-10-CM

## 2017-06-09 DIAGNOSIS — R0600 Dyspnea, unspecified: Secondary | ICD-10-CM

## 2017-06-09 DIAGNOSIS — G4733 Obstructive sleep apnea (adult) (pediatric): Secondary | ICD-10-CM

## 2017-06-09 MED ORDER — UMECLIDINIUM BROMIDE 62.5 MCG/INH IN AEPB
62.5000 ug | INHALATION_SPRAY | Freq: Every day | RESPIRATORY_TRACT | 3 refills | Status: AC
Start: 2017-06-09 — End: 2017-07-09

## 2017-06-09 MED ORDER — FLUTICASONE PROPIONATE  HFA 110 MCG/ACT IN AERO
2.0000 | INHALATION_SPRAY | Freq: Two times a day (BID) | RESPIRATORY_TRACT | 3 refills | Status: DC
Start: 2017-06-09 — End: 2018-06-11

## 2017-06-09 NOTE — Patient Instructions (Signed)
1. Change Qvar to flovent 110 - take 2 puffs twice per day and rinse mouth after use    2. Continue albuterol 2 puffs every 4 hours as needed for shortness of breath or wheeze. If you get palpitations you can see if your [facility] can substitute the albuterol for levalbuterol    3. If no improvement after 2 weeks and you are using your albuterol more than 2 times per week, start incruse ellipta inhaler one puff once per day    4. Please bring your CPAP unit to your next visit.

## 2017-06-09 NOTE — Progress Notes (Signed)
Brett Palmer is a 51 year old male seen today at the [facility] for Pulmonary Medicine as a new consultation at the request of Dr. Lady Deutscher for the chief complaint of dyspnea.    History of Presenting Illness:    Brett Palmer states he was very athletic and active, a triathlete until around 2013. In 2013 he developed palpitations and gained weight. 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 He is now taking diltiazem and continues to report some palpitations. He presents today however for dyspnea in the past 2 years. His dyspnea is largely exertional and positional but he also reports some dyspnea at rest. His dyspnea does not always correlate with his palpitations. He is now short of breath walking 1 flight of stairs and he describes orthopnea. He underwent a sleep study through the [place] in 2014. He does report some snoring and his Epworth Sleepiness Scale today is 11. The results of his sleep study are not readily available but he was issued CPAP. He feels he is drowning when wearing his CPAP with a nasal mask and is no longer using his CPAP. He denies leg swelling but reports some abdominal distension. He describes an occasional cough, productive at times of grey sputum. He also reports wheeze. He denies chest pains, fevers, hemoptysis or weight loss.    Brett Palmer reports hay fever as a child but denies any history of asthma as a child. He is using flonase for nasal congestion. He is a remote smoker with an approximate 5 pack year smoking history. He was deployed to the [place] in [place] for 6 months and recalls hazy skies with no clear exposure to burn pits. He reports occcasional reflux and a lump feeling in his throat and his taking prilosec. Brett Palmer completed PFT and a chest CT. He was started on Qvar about 2 months ago and is using 80 mcg 1 puff twice per day. He is using albuterol 3 times per week with some relief.     Review of  Systems:    Constitutional: Negative for weight loss and fevers.  HENT: Negative for ear pain and tinnitus  Eyes: Negative for eye pain, dry eyes, eye discharge and photophobia.  Cardiovascular: Negative for chest pain and syncope. Positive for palpitations.  Respiratory: Positive for shortness of breath, cough and wheeze.  Abdominal: Positive for abdominal distension. Negative for abdominal or hematochezia.  GU: Negative for dysuria, flank pain or hematuria.  Musculoskeletal: Negative for joint swelling.  Neurological: Positive for headaches. Negative for seizures, syncope, weakness and numbness.  Hematologic: Negative for easy bleeding.     All other systems were reviewed and negative.    Past Medical and Surgical History: His medical records were reviewed.    - AVNRT s/p slow pathway ablation in 2016  - Hypertension  - Obstructive sleep apnea  - PTSD, anxiety  - Alcohol abuse  - Transaminitis  - Migraine, part of gulf war syndrome  - Gastroesophageal reflux  - Meniere's   - S/p cholecystectomy    Allergies: Compazine and Latex.     Current Medications:    Current Outpatient Prescriptions   Medication Sig    albuterol 108 (90 BASE) MCG/ACT inhaler Inhale 2 puffs by mouth every 6 hours as needed for Wheezing.    ALPRAZolam (XANAX) 1 MG tablet Take 1 tablet (1 mg) by mouth 3 times daily as needed for Anxiety.    beclomethasone (QVAR) 80 MCG/ACT inhaler  Inhale 1 puff by mouth 2 times daily.    diltiazem (CARDIZEM CD-24HR) 120 MG ER capsule Take 1 capsule (120 mg) by mouth daily.    fluticasone propionate (FLONASE) 50 MCG/ACT nasal spray Spray 2 sprays into each nostril daily.    furosemide (LASIX) 20 MG tablet Take 1 tablet (20 mg) by mouth 2 times daily.    omeprazole (PRILOSEC) 20 MG capsule Take 20 mg by mouth daily.      pravastatin (PRAVACHOL) 40 MG tablet Take 1 tablet (40 mg) by mouth daily.     Family History:    His family smoked but there is no known family history of pulmonary disorders. His mother  had CAD and CHF and his father had melanoma.    Social History:    Brett Palmer was born in [place] and moved to [place] in 1989. He is married with x 2 children. He worked in [employer] and was deployed to [place] for 4 years and the [place]. 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 He has had negative TB testing. He enjoys Community education officer. Brett Palmer started smoking cigarettes at 16 years, smoking up to 1/2 packets of cigarettes per day before quitting around 1991 for an approximate 5 pack year smoking history. Brett Palmer has cut back on his alcohol to 2-3 glasses of wine. He denies illicit drug use. Brett Palmer has x2 dogs. He has switched his down comforters to synthetic. He denies any known mold exposure. He uses an outdoor hot tub x3 times a week.      Physical Examination:    [name] is seated comfortably in no respiratory distress, speaking full sentences with no stridor. Vital signs: Blood pressure 146/77, pulse 101, temperature 98.6 F (37 C), temperature source Oral, resp. rate 18, height 6' (1.829 m), weight 117 kg (258 lb), SpO2 95% RA. Modified Mallampati score of IV. Heart sounds are regular. The chest is clear with no added sounds. The abdomen is soft with no peritonism. There is no peripheral edema, clubbing or peripheral cyanosis. He is alert, orientated and following all simple commands.     Significant Diagnostics:    Pulmonary Function Testing: The images were personally reviewed.    Date FEV1 (%) FVC (%) FEV1/FVC TLC (%) DLCo (%)    03/20/2017 3.37 (81%) 5.03 (96%) 67 6.89 (93%) 29.89 (95%) Post BD FEV1 3.91, 16% change; MIP 51%, MEP 40%     CT chest 05/13/2017 - images personally reviewed. Mild paraseptal emphysema. IMPRESSION: 1. No acute pulmonary findings. No evidence of interstitial lung disease. 2. Fatty infiltration of the right ventricular wall which can be an indicator of arrhythmogenic right ventricular dysplasia.    CXR 10/19/2014 - IMPRESSION: Stable chest x-ray. No radiographic  evidence of acute cardiopulmonary disease.    Event monitor 01/26/2017 -  The patient's monitoring period was 12/31/2016 - 01/13/2017. Baseline sample showed Sinus Rhythm w/Artifact with a heart rate of 78.4 bpm. There were 1 critical, 0 serious, and 7 stable events that occurred. The report analysis of the critical, serious, stable and manually triggered events are listed below.  Manually Detected Events:  1 Stable: Sinus Rhythm   None or Accidental Push  1 Stable: Sinus Rhythm w/PVCs   Shortness of Breath; Light-Headedness  1 Stable: Sinus Rhythm   Tired or Fatigued  1 Stable: Sinus Tachycardia w/PVCs (2 in 1/Min)/Artifact   Shortness of Breath; Tired or Fatigued  1 Stable: Sinus Rhythm   Shortness of Breath  1 Stable: Sinus Rhythm w/Artifact   Shortness of Breath; Tired or Fatigued    TTE 01/20/2017 - Summary: 1. Technically difficult study with suboptimal images.2. The left ventricular size is mildly increased and the left ventricular systolic function is normal.3. No left ventricular hypertrophy.4. The right ventricular size is mildly enlarged and systolic function is normal.5. No significant valvular abnormalities.6. Recommend use of IV contrast agent to better identify endocardial borders if clinically indicated.7. Compared to prior study no significant change.    Nuc stress 12/31/2016. IMPRESSION: 1. Normal LV myocardial perfusion. No evidence of inducible ischemia or prior transmural myocardial infarctions. 2. Normal LV chamber size and systolic function. 3. Average exercise capacity. Normal hemodynamic response to exercise. Prolonged tachycardia during recovery with transient upper back pain and shortness of breath. 4. Overall low risk for hard cardiac events.    Laboratory data reviewed:   05/29/2017 Na 138, K 4.2, Cl 96, HCO2 29, BUN 8, Cr 0.73     Tbil 0.62, AST 194, ALT 241, ALK 112   02/24/2017 WBC 6.4, Eos 3%, HgB 14.9, Plt 157     11/17/2011 HIV negative    Assessment and Plan:    Mr.  RIDHAAN DREIBELBIS is a 51 year old male seen today at the [facility] for Pulmonary Medicine as a new consultation at the request of Dr. Lady Deutscher for the chief complaint of dyspnea.    Mr. Percival is a remote smoker who describes exertional and positional dyspnea. Pulmonary function testing shows mild obstruction with a significant bronchodilator response. Chest CT shows very mild paraseptal emphysema. I suspect his dyspnea is secondary to obstructive lung disease, likely due to asthma possibly with some COPD overlap. I have suggested A1AT testing. He gives no clear history to suggest significant allergies. Trigger avoidance was discussed and the role of controller and rescue inhalers in asthma was discussed. Mr. Wickens gets many of his medications through the [place]. I have suggested increasing his inhaled corticosteroids to fluticasone 110 x2 puffs twice per day. Given his history of palpitations the option of changing his albuterol to levalbuterol prn was discussed. Should there be no improvement despite these measures I have suggested adding a LAMA. He is taking a PPI. His vaccinations were reviewed.    Mr. Blanton is encouraged to continue follow up with his PCP for his elevated liver transaminases. His last TTE did not suggest significant pulmonary hypertension.    Mr. Fontenette has been diagnosed with obstructive sleep apnea through the [place]. The results of his outside sleep study are not available but he was issued CPAP through the [place]. He is not using his CPAP on a regular basis. The option of a repeat PSG was discussed. I have requested he bring in his CPAP unit for download and review. Weight loss is encouraged.    Impression: - Obstructive lung disease, asthma, possible COPD overlap    - Obstructive sleep apnea    - Obesity    - Gastroesophageal reflux    - Rhinitis    - PTSD, anxiety    - Elevated LFT    Plan: - Trigger avoidance discussed   - A1AT testing   - Change Qvar to fluticasone 110 x2 puffs bid, consider  changing prn albuterol to levalbuterol   - If no improvement after above, add LAMA   - Bring in CPAP unit to next visit. Option of repeat PSG offered   - Weight loss   - Continue flonase   - Continue PPI   -  Follow up PCP for LFT    All questions were answered as able. Follow up 2 months    Lizvet Chunn Y. Clelia Schaumann, MBBS

## 2017-06-25 ENCOUNTER — Encounter (INDEPENDENT_AMBULATORY_CARE_PROVIDER_SITE_OTHER): Payer: Self-pay | Admitting: Family Medicine

## 2017-06-25 NOTE — Telephone Encounter (Signed)
From: Jackalyn Lombard  To: [name], MD  Sent: 06/25/2017 3:49 PM PDT  Subject: [name] Afternoon Dr. Salvadore Dom,    I was wondering what the results of my recent MRI were. I had my pulmonology appointment and the steroids they gave me seem to be helping. If you could let me know I would appreciate it.    Regards,    Waymond Cera

## 2017-07-20 ENCOUNTER — Telehealth (INDEPENDENT_AMBULATORY_CARE_PROVIDER_SITE_OTHER): Payer: Self-pay | Admitting: Family Medicine

## 2017-07-20 NOTE — Telephone Encounter (Signed)
14:41    RN called & left message to voicemail to call us back.

## 2017-07-20 NOTE — Telephone Encounter (Addendum)
Symptom Call        Next office visit:  Visit date not found    What symptom is the patient experiencing? Shortness of breath OR difficulty breathing Patients wife called stating that patient has a cold last week and last 2 days has been having difficulty breathing that he has to sleep sitting up.Patietn wife stating he has a scratchy throat and body aches feels week and not eating well. Patient doesn't want to go to ER or Urgent care.Patietn wife stating she is at work and patient is at home sleeping. Agent contacted nurse and was advised that she would call pt back.    Name of PCP Provider: [name] Insurance Coverage Verified: Active  Last office visit: 04/30/2017    Who is reporting the symptoms? Spouse is calling on behalf of patient    Is this a new or ongoing symptom? new  Estimated time since experiencing symptom(s)? 1 week     Best way to contact patient: [phone] home   Alternative communication method:  work     Is the call received after 3 PM? No

## 2017-07-20 NOTE — Telephone Encounter (Signed)
14:43        RN called spoke / spouse w/ 2 pt. Identifiers  Verified.    Situation:  Reported  pt.  Is at home by himself w/ difficulty of breathing  Had cold & cough w/ colored phlegm & ST, not eating  a all    Background:    Gastroesophageal reflux disease     Migraine headache     PTSD (post-traumatic stress disorder) 1995-present    SVT (supraventricular tachycardia) (CMS-HCC         Assessment:    States ,  has  cough & cold w/ fever last week  .Not eating all per spouse.  Patient has difficulty  breathing &  was  In sitting position to be able to sleep at night.  Per spouse she can hear  him wheezing a lot.    Recommendations:  Advised  Pt. spouse to take her  husband to ER for a higher level of care.  Patient spouse states he would not go to ER .   visits.  ER precautionary measures discussed & reviewed w/ pt. spouse  Scheduled  W/ Dr. Sanjuana Letters tomorrow 8/21 @ 4pm.  Pt. spouse verbalized understanding of the above.

## 2017-07-21 ENCOUNTER — Ambulatory Visit (INDEPENDENT_AMBULATORY_CARE_PROVIDER_SITE_OTHER): Payer: Commercial Managed Care - HMO | Admitting: Family Medicine

## 2017-07-21 ENCOUNTER — Telehealth (INDEPENDENT_AMBULATORY_CARE_PROVIDER_SITE_OTHER): Payer: Self-pay | Admitting: Family Medicine

## 2017-07-21 NOTE — Telephone Encounter (Signed)
Patient returning call, no documentation located on who called.  Patient not sure who called and he thinks that he should cancel visit today.  Patient notified to wait for nurse to call him back and he said he will be in a class.  Patient notified the nurse will  Leave a message and he can call back when available.

## 2017-07-22 NOTE — Telephone Encounter (Signed)
LM for pt to return call to clinic to discuss sx.     Following up on messages to ensure pt has been taken cared of

## 2017-07-23 NOTE — Telephone Encounter (Signed)
12:25    RN called & left message to voicemail.  Attempted  3 x  no call back  from pt.  Closing encounter.

## 2017-08-10 ENCOUNTER — Encounter (INDEPENDENT_AMBULATORY_CARE_PROVIDER_SITE_OTHER): Payer: Commercial Managed Care - HMO | Admitting: Pulmonary Disease

## 2017-08-10 NOTE — Progress Notes (Unsigned)
The patient no-showed today's visit

## 2017-08-23 ENCOUNTER — Other Ambulatory Visit (INDEPENDENT_AMBULATORY_CARE_PROVIDER_SITE_OTHER): Payer: Self-pay | Admitting: Family Medicine

## 2017-08-23 DIAGNOSIS — E785 Hyperlipidemia, unspecified: Secondary | ICD-10-CM

## 2017-08-23 DIAGNOSIS — J309 Allergic rhinitis, unspecified: Principal | ICD-10-CM

## 2017-08-23 DIAGNOSIS — I10 Essential (primary) hypertension: Secondary | ICD-10-CM

## 2017-08-23 DIAGNOSIS — R609 Edema, unspecified: Secondary | ICD-10-CM

## 2017-08-23 DIAGNOSIS — J449 Chronic obstructive pulmonary disease, unspecified: Secondary | ICD-10-CM

## 2017-08-24 NOTE — Telephone Encounter (Signed)
Last visit in this department 07/21/2017  Next visit in this department Visit date not found    Last labs:   Lab Results   Component Value Date    CHOL 199 05/29/2017    HDL 74 05/29/2017    LDLCALC 101 05/29/2017    TRIG 118 05/29/2017    TSH 3.57 12/09/2016    A1C 5.6 05/29/2017       Blood Pressure   06/09/17 146/77   05/07/17 146/83   04/30/17 124/76     HM Items that are overdue or due soon include:  Health Maintenance Due   Topic Date Due    INFLUENZA VACCINE  07/01/2017     Allergies   Allergen Reactions    Compazine Anxiety    Latex Rash       Corky Crafts, LVN/LPN

## 2017-08-24 NOTE — Telephone Encounter (Signed)
From: Jackalyn Lombard  To: [name], MD  Sent: 08/23/2017 6:18 PM PDT  Subject: Medication Renewal Request    Original authorizing provider: [name], MD    Jackalyn Lombard would like a refill of the following medications:  fluticasone propionate (FLONASE) 50 MCG/ACT nasal spray [name], MD]  lisinopril (PRINIVIL, ZESTRIL) 10 MG tablet [name], MD]  furosemide (LASIX) 20 MG tablet [name], MD]  pravastatin (PRAVACHOL) 40 MG tablet [name], MD]  beclomethasone (QVAR) 80 MCG/ACT inhaler [name], MD]    Preferred [facility]: [id number] - [place], [address] AT [address]:

## 2017-08-25 MED ORDER — LISINOPRIL 10 MG OR TABS
10.0000 mg | ORAL_TABLET | Freq: Every day | ORAL | 0 refills | Status: DC
Start: 2017-08-25 — End: 2018-06-11

## 2017-08-25 MED ORDER — FLUTICASONE PROPIONATE 50 MCG/ACT NA SUSP
2.0000 | Freq: Every day | NASAL | 11 refills | Status: DC
Start: 2017-08-25 — End: 2020-02-20

## 2017-08-25 MED ORDER — PRAVASTATIN SODIUM 40 MG OR TABS
40.0000 mg | ORAL_TABLET | Freq: Every day | ORAL | 2 refills | Status: DC
Start: 2017-08-25 — End: 2018-06-11

## 2017-08-25 MED ORDER — FUROSEMIDE 20 MG OR TABS
20.0000 mg | ORAL_TABLET | Freq: Two times a day (BID) | ORAL | 0 refills | Status: DC
Start: 2017-08-25 — End: 2018-06-11

## 2017-08-25 NOTE — Telephone Encounter (Signed)
1 month rx refills authorized except Qvar (was switched to Flovent by pulm). Pt due for f/u with me, please notify/schedule.

## 2017-08-26 ENCOUNTER — Encounter (INDEPENDENT_AMBULATORY_CARE_PROVIDER_SITE_OTHER): Payer: Self-pay

## 2017-08-26 NOTE — Telephone Encounter (Signed)
Sent MyChart msg.  DONE

## 2017-11-19 ENCOUNTER — Telehealth (INDEPENDENT_AMBULATORY_CARE_PROVIDER_SITE_OTHER): Payer: Self-pay | Admitting: Family Medicine

## 2017-11-19 NOTE — Telephone Encounter (Signed)
Patient called in regards to message. States to disregard message below.

## 2017-11-19 NOTE — Telephone Encounter (Signed)
Noted  

## 2017-11-19 NOTE — Telephone Encounter (Signed)
Pt calling to see if he can have PCP sign his DMV form that says he is 100% disabled. Mentions he has a letter from the [place] that states he is, but that form just needs an MD sig. Will try to drop it off today, his placard expires on 11/30/2017.

## 2017-12-14 NOTE — Telephone Encounter (Signed)
Opened in error

## 2018-03-02 ENCOUNTER — Other Ambulatory Visit: Payer: Self-pay

## 2018-03-09 ENCOUNTER — Other Ambulatory Visit: Payer: Self-pay

## 2018-03-22 ENCOUNTER — Telehealth (INDEPENDENT_AMBULATORY_CARE_PROVIDER_SITE_OTHER): Payer: Self-pay | Admitting: Family Medicine

## 2018-03-22 NOTE — Telephone Encounter (Signed)
Symptom Call        [place] to Triage    Next office visit:  03/23/18- Agent did offer a sooner Appt Pt refused. Per Pt okay to see next available MD.    What symptom is the patient experiencing?  Pain in L wrist x1 day , Per Pt fell yesterday and is currently wearing a brace, is requesting a X-ray. Per Pt doesn't want a follow up call from Triage RN.     Name of PCP Provider: [name]   Insurance Coverage Verified: Active- in network  Last office visit:04/30/17    Who is reporting the symptoms? Incoming call from patient    Is this a new or ongoing symptom? new  Estimated time since experiencing symptom(s)? x1 day    Best way to contact patient: [phone] (mobile)       Is the call received after 3 PM? No

## 2018-03-22 NOTE — Telephone Encounter (Signed)
NOTED.  Pt does not want call from RN.  Has appt tomorrow in clinic.     Future Appointments   Date Time Provider [place]   03/23/2018 10:40 AM Fiorella, Artemio Aly, MD [facility]

## 2018-03-23 ENCOUNTER — Encounter (HOSPITAL_BASED_OUTPATIENT_CLINIC_OR_DEPARTMENT_OTHER): Payer: Self-pay

## 2018-03-23 ENCOUNTER — Telehealth (INDEPENDENT_AMBULATORY_CARE_PROVIDER_SITE_OTHER): Payer: Self-pay | Admitting: Family Medicine

## 2018-03-23 ENCOUNTER — Encounter (INDEPENDENT_AMBULATORY_CARE_PROVIDER_SITE_OTHER): Payer: Self-pay

## 2018-03-23 ENCOUNTER — Inpatient Hospital Stay (INDEPENDENT_AMBULATORY_CARE_PROVIDER_SITE_OTHER)
Admit: 2018-03-23 | Discharge: 2018-03-23 | Disposition: A | Discharge status: Home Health | Payer: Commercial Managed Care - HMO | Attending: Family Medicine | Admitting: Family Medicine

## 2018-03-23 ENCOUNTER — Encounter (INDEPENDENT_AMBULATORY_CARE_PROVIDER_SITE_OTHER): Payer: Self-pay | Admitting: Family Medicine

## 2018-03-23 ENCOUNTER — Ambulatory Visit (INDEPENDENT_AMBULATORY_CARE_PROVIDER_SITE_OTHER): Payer: Commercial Managed Care - HMO | Admitting: Family Medicine

## 2018-03-23 VITALS — BP 152/90 | HR 104 | Temp 98.3°F | Resp 18 | Ht 72.0 in | Wt 271.0 lb

## 2018-03-23 DIAGNOSIS — W19XXXA Unspecified fall, initial encounter: Secondary | ICD-10-CM

## 2018-03-23 DIAGNOSIS — S6992XA Unspecified injury of left wrist, hand and finger(s), initial encounter: Secondary | ICD-10-CM

## 2018-03-23 DIAGNOSIS — M25539 Pain in unspecified wrist: Secondary | ICD-10-CM

## 2018-03-23 DIAGNOSIS — R748 Abnormal levels of other serum enzymes: Secondary | ICD-10-CM

## 2018-03-23 DIAGNOSIS — Z723 Lack of physical exercise: Secondary | ICD-10-CM

## 2018-03-23 NOTE — Telephone Encounter (Signed)
Xray ordered in anticipation of visit today.

## 2018-03-25 NOTE — Progress Notes (Signed)
FAMILY MEDICINE PROGRESS NOTE    CC:    Chief Complaint   Patient presents with    Wrist Pain     left wrist       SUBJECTIVE:    COUNCIL MUNGUIA is a 52 year old male who presents for wrist pain.       HPI:   1) Pt fell 1 day ago and has pain in his left wrist.  He has been wearing a brace that he has at home.  He notes pain is all around his wrist no one specific area of prominent tenderness. No discoloration.  No other trauma.    2) Liver: has elevated liver enzymes - did not come back to recheck.  Has been continuing to drink alcohol and does not want to test today.        Review of Systems:   As per HPI and: - All others negative      Patient Active Problem List   Diagnosis    Hypertensive disorder    Anxiety    Palpitations    ETOH abuse    PTSD (post-traumatic stress disorder)    Transaminitis    Inadequate exercise - not at goal       Outpatient Medications Prior to Visit   Medication Sig Dispense Refill    albuterol 108 (90 BASE) MCG/ACT inhaler Inhale 2 puffs by mouth every 6 hours as needed for Wheezing. 1 Inhaler 2    ALPRAZolam (XANAX) 1 MG tablet Take 1 tablet (1 mg) by mouth 3 times daily as needed for Anxiety. 12 tablet 0    beclomethasone (QVAR) 80 MCG/ACT inhaler Inhale 1 puff by mouth 2 times daily. 1 Inhaler 2    diltiazem (CARDIZEM CD-24HR) 120 MG ER capsule Take 1 capsule (120 mg) by mouth daily. 30 capsule 2    fluticasone (FLOVENT HFA) 110 MCG/ACT inhaler Inhale 2 puffs by mouth 2 times daily. Rinse mouth after use 3 Inhaler 3    fluticasone propionate (FLONASE) 50 MCG/ACT nasal spray Spray 2 sprays into each nostril daily. 1 bottle 11    furosemide (LASIX) 20 MG tablet Take 1 tablet (20 mg) by mouth 2 times daily. 60 tablet 0    lisinopril (PRINIVIL, ZESTRIL) 10 MG tablet Take 1 tablet (10 mg) by mouth daily. 30 tablet 0    omeprazole (PRILOSEC) 20 MG capsule Take 20 mg by mouth daily.        pravastatin (PRAVACHOL) 40 MG tablet Take 1 tablet (40 mg) by mouth daily. 30 tablet  2     No facility-administered medications prior to visit.            OBJECTIVE:  BP 152/90    Pulse 104    Temp 98.3 F (36.8 C) (Oral)    Resp 18    Ht 6' (1.829 m)    Wt 122.9 kg (271 lb)    SpO2 95%    BMI 36.75 kg/m     Physical Exam: General Appearance: healthy, alert, no distress, pleasant affect, cooperative.  Musculoskeletal: hand: normal without arthritis or deformities, normal tendon function, +ttp diffusely, over extensor aspect, +snuff box ttp, +ulnar head ttp, neg finklestein test        LABS:  Results for orders placed or performed in visit on 05/29/17   Lipid Panel Green Plasma Separator Tube   Result Value Ref Range    Cholesterol 199 <200 mg/dL    HDL-Cholesterol 74 mg/dL    LDL-Chol (Calc) 101 <  160 mg/dL    Non-HDL Cholesterol 125 mg/dL    Triglycerides 118 10 - 170 mg/dL   Glycosylated Hgb(A1C), Blood Lavender   Result Value Ref Range    Glyco Hgb (A1C) 5.6 4.8 - 5.8 %   Comprehensive Metabolic Panel Green Plasma Separator Tube   Result Value Ref Range    Glucose 108 (H) 70 - 99 mg/dL    BUN 8 6 - 20 mg/dL    Creatinine 0.73 0.67 - 1.17 mg/dL    GFR >60 mL/min    Sodium 138 136 - 145 mmol/L    Potassium 4.2 3.5 - 5.1 mmol/L    Chloride 96 (L) 98 - 107 mmol/L    Bicarbonate 29 22 - 29 mmol/L    Anion Gap 13 7 - 15 mmol/L    Calcium 9.5 8.5 - 10.6 mg/dL    Total Protein 7.7 6.0 - 8.0 g/dL    Albumin 4.6 3.5 - 5.2 g/dL    Bilirubin, Tot 0.62 <1.2 mg/dL    AST (SGOT) 241 (H) 0 - 40 U/L    ALT (SGPT) 194 (H) 0 - 41 U/L    Alkaline Phos 112 40 - 129 U/L           ASSESSMENT & PLAN:  COLEBY YETT is a 52 year old male was seen today for:  [place] was seen today for wrist pain.    Diagnoses and all orders for this visit:    Pain in wrist, unspecified laterality  Comments:  left, s/p fall 1 day ago  Orders:  -     X-Ray Wrist Complete Minimum 3 Vws - Left; Future  -     X-Ray Hand Minimum 3 Views - Left; Future  -     DME Misc Order: [facility] supplied left wrist splint with thumb spica    Inadequate  exercise    Elevated liver enzymes  Comments:  pt counseled to recheck and follow up with PCP, etoh cessation, avoidance of acetominophen, he verbalized undertanding.        Health Maintenance   Topic Date Due    COLON CANCER SCREENING WITH FIT  01/06/2018    PHQ2 depression screen  02/24/2018    IMM_TD/TDAP=>11 YO  09/15/2026    INFLUENZA VACCINE  Completed    IMM pneumococcal 19-64 Medium Risk PPSV23 only  Completed       No Follow-up on file.    There are no Patient Instructions on file for this visit.    Timira Bieda R. Alda Ponder, MD    Plan discussed with pt including risks/benefits/alternatives including watchful waiting.  Informed pt of 24/7 on call MD.  ED if acutely worsening after hours.  Pt verbalized understanding.    Medications reviewed with patient and medication list reconciled.  Over the counter medications, herbal therapies and supplements reviewed.  Patient's understanding and response to medications assessed.    Barriers to medications assessed and addressed.  Risks, benefits, alternatives to medications reviewed.

## 2018-05-13 ENCOUNTER — Encounter (INDEPENDENT_AMBULATORY_CARE_PROVIDER_SITE_OTHER): Payer: Self-pay | Admitting: Internal Medicine

## 2018-05-13 DIAGNOSIS — Z0000 Encounter for general adult medical examination without abnormal findings: Secondary | ICD-10-CM

## 2018-06-02 ENCOUNTER — Encounter: Payer: Self-pay | Admitting: Family Medicine

## 2018-06-04 ENCOUNTER — Telehealth (INDEPENDENT_AMBULATORY_CARE_PROVIDER_SITE_OTHER): Payer: Self-pay | Admitting: Family Medicine

## 2018-06-04 NOTE — Telephone Encounter (Signed)
MD ACTION REQUESTED: NO/FYI only Patient with signs of dehydration and advised to be seen in [place] today but patient declined. Patient stated since his symptoms have been going on for 1 month, he can wait to get appt next week. Patient requested for PCP or [name] only.     RN ACTION: Educated patient if severe abdominal pain reoccurs to go to ED and patient verbalized understanding.      Future Appointments   Date Time Provider [place]   06/11/2018 10:20 AM Waymon Amato, MD [facility]       Date: 06/04/2018   Time: 3:38 PM   Name of PCP Provider: [name]     Direct call transfer to Triage RN  Patient name: Brett Palmer 52 year old   Verified DOB  Pt advised all calls are recorded for quality assurance.      Reason for call: Symptoms  CC: 10/10 LLQ Abdominal pain yesterday and felt possible mass/stricture to same area.   Patient denies pain today.  Onset: 1 month but worsening since yesterday  Associated symptoms: Unable to eat and drink, nausea, abdominal bloating happens right after eating/drinking, pressure to lower abdomen, able to urinate but with slight difficulty, constipation.   Does anything make it worse? Has worse nausea in the morning.  Have you tried anything to relieve your symptoms? Alka seltzer, Gas-X, Prilosec with no relief.  Do you feel this issue can wait for you to see your PCP or do you want to be seen sooner by any provider? Patient requests to be seen and get GI referral. No appt available in the clinic today. Offered to be seen in [place] but patient declined.     DENIES: fever, chills, vomiting, hematuria, dysuria, dizziness, chest pain, SOB.     Pertinent PMHx:   Patient Active Problem List   Diagnosis    Hypertensive disorder    Anxiety    Palpitations    ETOH abuse    PTSD (post-traumatic stress disorder)    Transaminitis    Inadequate exercise - not at goal       ER Precautions reviewed: Chest pain, sob, severe headache, numbness or weakness on one side,  feeling faint/dizziness, confusion, slurred speech.     Patient is non compliant with plan of care.  RN Encouraged patient to call back PRN or if symptoms worsen or persist.   RN will forward information to Dr. Salvadore Dom, Cleta Alberts     RN confirmed pt's pharmacy and allergies   [facility] [name]  [place] [address]  Phone: [phone] Fax: [phone]     Last visit in this department 03/23/2018  Next visit in this department 06/11/2018

## 2018-06-04 NOTE — Telephone Encounter (Signed)
Symptom Call          Next office visit:  Visit date not found  List the date of PCP first available:   Did you offer Express Care/Urgent Care:     What symptom is the patient experiencing? abdominal pain and bloated  , unable to eat or drink water . Constipation , nausea, and   weakness .    Name of PCP Provider: [name]   Insurance Coverage Verified: Active- in network  Last office visit: Visit date not found    Who is reporting the symptoms? Incoming call from patient    Is this a new or ongoing symptom? ongoing  Estimated time since experiencing symptom(s)? 1 month but yesterday is got worse     Best way to contact patient: [phone] (mobile)

## 2018-06-11 ENCOUNTER — Ambulatory Visit (INDEPENDENT_AMBULATORY_CARE_PROVIDER_SITE_OTHER): Payer: Commercial Managed Care - HMO | Admitting: Family Medicine

## 2018-06-11 ENCOUNTER — Telehealth (HOSPITAL_BASED_OUTPATIENT_CLINIC_OR_DEPARTMENT_OTHER): Payer: Self-pay

## 2018-06-11 ENCOUNTER — Other Ambulatory Visit (HOSPITAL_BASED_OUTPATIENT_CLINIC_OR_DEPARTMENT_OTHER): Payer: Self-pay | Admitting: Gastroenterology

## 2018-06-11 ENCOUNTER — Telehealth (INDEPENDENT_AMBULATORY_CARE_PROVIDER_SITE_OTHER): Payer: Self-pay

## 2018-06-11 ENCOUNTER — Encounter (INDEPENDENT_AMBULATORY_CARE_PROVIDER_SITE_OTHER): Payer: Self-pay

## 2018-06-11 ENCOUNTER — Encounter (INDEPENDENT_AMBULATORY_CARE_PROVIDER_SITE_OTHER): Payer: Self-pay | Admitting: Family Medicine

## 2018-06-11 ENCOUNTER — Other Ambulatory Visit: Payer: Commercial Managed Care - HMO

## 2018-06-11 VITALS — BP 139/85 | HR 104 | Temp 98.7°F | Ht 72.0 in | Wt 271.0 lb

## 2018-06-11 DIAGNOSIS — I10 Essential (primary) hypertension: Secondary | ICD-10-CM

## 2018-06-11 DIAGNOSIS — J45909 Unspecified asthma, uncomplicated: Secondary | ICD-10-CM

## 2018-06-11 DIAGNOSIS — R109 Unspecified abdominal pain: Secondary | ICD-10-CM

## 2018-06-11 DIAGNOSIS — Z1211 Encounter for screening for malignant neoplasm of colon: Secondary | ICD-10-CM

## 2018-06-11 DIAGNOSIS — K760 Fatty (change of) liver, not elsewhere classified: Secondary | ICD-10-CM

## 2018-06-11 DIAGNOSIS — Z1389 Encounter for screening for other disorder: Secondary | ICD-10-CM

## 2018-06-11 DIAGNOSIS — Z1339 Encounter for screening examination for other mental health and behavioral disorders: Secondary | ICD-10-CM

## 2018-06-11 DIAGNOSIS — J449 Chronic obstructive pulmonary disease, unspecified: Secondary | ICD-10-CM

## 2018-06-11 DIAGNOSIS — R110 Nausea: Secondary | ICD-10-CM

## 2018-06-11 MED ORDER — PEG 3350-KCL-NABCB-NACL-NASULF 240 GM OR SOLR
ORAL | 0 refills | Status: DC
Start: 2018-06-11 — End: 2018-09-15

## 2018-06-11 MED ORDER — ZOLPIDEM TARTRATE 10 MG OR TABS
10.00 mg | ORAL_TABLET | Freq: Every evening | ORAL | Status: DC | PRN
Start: ? — End: 2019-04-10

## 2018-06-11 MED ORDER — HYDROCHLOROTHIAZIDE 12.5 MG OR TABS
12.5000 mg | ORAL_TABLET | Freq: Every day | ORAL | 1 refills | Status: DC
Start: 2018-06-11 — End: 2019-04-10

## 2018-06-11 MED ORDER — NEXIUM PO
Freq: Every day | ORAL | Status: DC
Start: ? — End: 2019-04-10

## 2018-06-11 MED ORDER — FLUTICASONE PROPIONATE  HFA 110 MCG/ACT IN AERO
2.0000 | INHALATION_SPRAY | Freq: Two times a day (BID) | RESPIRATORY_TRACT | 3 refills | Status: DC
Start: 2018-06-11 — End: 2018-06-11

## 2018-06-11 NOTE — Progress Notes (Signed)
FAMILY MEDICINE PROGRESS NOTE    CC:    Chief Complaint   Patient presents with    Abdominal Pain     Has been a problem all his life, lower abdomen, gets bloated to the point where his belly button sticks out. Will have nausea dry heaving (happens about every morning).       SUBJECTIVE:    Brett Palmer is a 52 year old male with abdominal pain who presents for worsening symptoms.       HPI:   1) Abd pain: when eating at a restaurant, gets bloated, or if eating regular bread.   Plain water can cause bloating.   Has decrease in urine output   Rare days feeling well.   Skips eating - eats one meal a day   Wife made him come in, not getting worse   In AM has dry heaves   Nauseous   No sweats/chills/cough, +constipation, +urgency - uses laxative, using mag oxide to lkeep regular   "my wife made me come in."      2) Breathing: steroid inhalers - got thrush back of tongue twice   Almost a month having increased cough. No swelling/PND.    3) HTN: monitors at home, not taking lisinpril, tried several times.     4) HDL: couldn't take it 2/2 leg cramping.     5) Ambien prn        Review of Systems:   As per HPI and: - All others negative      Patient Active Problem List   Diagnosis    Hypertensive disorder    Anxiety    Palpitations    ETOH abuse    PTSD (post-traumatic stress disorder)    Transaminitis    Inadequate exercise - not at goal       Outpatient Medications Prior to Visit   Medication Sig Dispense Refill    albuterol 108 (90 BASE) MCG/ACT inhaler Inhale 2 puffs by mouth every 6 hours as needed for Wheezing. 1 Inhaler 2    ALPRAZolam (XANAX) 1 MG tablet Take 1 tablet (1 mg) by mouth 3 times daily as needed for Anxiety. 12 tablet 0    beclomethasone (QVAR) 80 MCG/ACT inhaler Inhale 1 puff by mouth 2 times daily. 1 Inhaler 2    diltiazem (CARDIZEM CD-24HR) 120 MG ER capsule Take 1 capsule (120 mg) by mouth daily. 30 capsule 2    Esomeprazole Magnesium (NEXIUM PO) daily.      fluticasone (FLOVENT HFA)  110 MCG/ACT inhaler Inhale 2 puffs by mouth 2 times daily. Rinse mouth after use 3 Inhaler 3    fluticasone propionate (FLONASE) 50 MCG/ACT nasal spray Spray 2 sprays into each nostril daily. 1 bottle 11    furosemide (LASIX) 20 MG tablet Take 1 tablet (20 mg) by mouth 2 times daily. 60 tablet 0    lisinopril (PRINIVIL, ZESTRIL) 10 MG tablet Take 1 tablet (10 mg) by mouth daily. 30 tablet 0    omeprazole (PRILOSEC) 20 MG capsule Take 20 mg by mouth daily.        pravastatin (PRAVACHOL) 40 MG tablet Take 1 tablet (40 mg) by mouth daily. 30 tablet 2    zolpidem (AMBIEN) 10 MG tablet Take 10 mg by mouth nightly as needed for Insomnia.       No facility-administered medications prior to visit.          OBJECTIVE:  BP 139/85 (BP Location: Left arm, BP Patient Position: Sitting, BP  cuff size: Regular)    Pulse 104    Temp 98.7 F (37.1 C)    Ht 6' (1.829 m)    Wt 122.9 kg (271 lb)    SpO2 96%    BMI 36.75 kg/m     Physical Exam: General Appearance: healthy, alert, no distress, pleasant affect, cooperative.  Neck:  Neck supple. No adenopathy, thyroid symmetric, normal size.  Heart:  normal rate and regular rhythm, no murmurs, clicks, or gallops.  Lungs: lungs clear to auscultation.  Abdomen: sl distended, soft, no discrete masses, mildly diffusely tender.   Extremities:  no cyanosis, clubbing, or edema.       ASSESSMENT & PLAN:  Brett Palmer is a 52 year old male was seen today for:  Brett Palmer was seen today for abdominal pain.    Diagnoses and all orders for this visit:    Fatty liver  -     Comprehensive Metabolic Panel Green Plasma Separator Tube; Future  -     Lipase, Blood Green Plasma Separator Tube; Future  -     CBC w/ Diff Lavender; Future  -     Stool Immunochemical Occult Blood; Future  -     US Abdomen Complete; Future  -     Liver Clinic    Screened negative for alcohol use    Screened negative for drug use    Chronic obstructive pulmonary disease, unspecified COPD type (CMS-HCC)    Asthma, unspecified  asthma severity, unspecified whether complicated, unspecified whether persistent  -     Discontinue: fluticasone (FLOVENT HFA) 110 MCG/ACT inhaler; Inhale 2 puffs by mouth 2 times daily. Rinse mouth after use    Abdominal pain, unspecified abdominal location  -     Comprehensive Metabolic Panel Green Plasma Separator Tube; Future  -     Lipase, Blood Green Plasma Separator Tube; Future  -     CBC w/ Diff Lavender; Future  -     Stool Immunochemical Occult Blood; Future  -     Gastroenterology Clinic  -     Liver Clinic    Nausea  -     G[facility] [place] for colon cancer  -     GI Endoscopy Procedure Service Request (Non-GI Use Only); Future    Essential hypertension  -     hydrochlorothiazide (HYDRODIURIL) 12.5 MG tablet; Take 1 tablet (12.5 mg) by mouth daily.      Will follow up results and manage accordingly.  Encouraged pt to schedule with hepatology  Pt appears overall well today, no acute distress.    Health Maintenance   Topic Date Due    COLON CANCER SCREENING WITH FIT  01/06/2018    INFLUENZA VACCINE  08/31/2018    PHQ9 Depression Monitoring doc flowsheet  10/12/2018    IMM_TD/TDAP=>11 YO  09/15/2026    IMM pneumococcal 19-64 Medium Risk PPSV23 only  Completed         Brett Kitch R. Alda Ponder, MD    Plan discussed with pt including risks/benefits/alternatives including watchful waiting.  Informed pt of 24/7 on call MD.  ED if acutely worsening after hours.  Pt verbalized understanding.    Medications reviewed with patient and medication list reconciled.  Over the counter medications, herbal therapies and supplements reviewed.  Patient's understanding and response to medications assessed.    Barriers to medications assessed and addressed.  Risks, benefits, alternatives to medications reviewed.

## 2018-06-11 NOTE — Telephone Encounter (Signed)
Routing to Care Team RN for review.

## 2018-06-11 NOTE — Telephone Encounter (Signed)
1st attempt: Called patient to schedule procedure no answer, left voicemail message. If patient calls back, please assist with scheduling . Thank you

## 2018-06-11 NOTE — Telephone Encounter (Signed)
Bowel prep pended and routed to provider for review, Thank you.

## 2018-06-11 NOTE — Telephone Encounter (Signed)
Patient is being referred to Hepatology Clinic for Fatty Liver. 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     Referring Provider:   Waymon Amato, MD     Internal/External referral: Internal    Patient was not scheduled.    1) Is patient being referred for evaluation of liver lesion, liver cancer, liver cyst or mass? no     a. If yes, has patient had abdominal imaging (MRI,CT or [place])?   b. If yes, when/where was imaging done?     2) Does patient have a copy of imaging on CD?        If yes, please ask patient to bring it to their appointment.     3) Does patient have a copy of imaging report?           If yes, please ask patient to bring it to their appointment.

## 2018-06-11 NOTE — Telephone Encounter (Signed)
Caller: Patient  Reason:  Procedure Scheduling  Last Office Visit: consult on 06/16/2018  Phone Number: [phone]  Additional Notes: Received a call from the patient to schedule his colonoscopt. The patient also has a gen gi clinic consult referral. Patient was booked on 06/16/2018 with Dr. Chestine Spore. Please assist with colonoscopy appointment. Thank you.

## 2018-06-12 ENCOUNTER — Other Ambulatory Visit (HOSPITAL_BASED_OUTPATIENT_CLINIC_OR_DEPARTMENT_OTHER): Payer: Self-pay | Admitting: Gastroenterology

## 2018-06-12 NOTE — Telephone Encounter (Signed)
52 yo male with enlarged  fatty liver on [place] Abd 10/01/15 and again seen on CT Chest 05/13/17, elev liver enzymes; triple digits 05/29/17, hx HTN, PTSD, anxiety, ETOH abuse, referred by Dr. Jerilynn Mages.[place], to Hepatology for fatty liver management, app't pending.    Results for Brett Palmer, Brett Palmer (MRN [id number]) as of 06/12/2018 14:45   Ref. Range 09/03/2015 14:50 12/09/2016 10:55 02/24/2017 12:30 05/29/2017 12:15   Alkaline Phos Latest Ref Range: 40 - 129 U/L 87 92 83 112   ALT (SGPT) Latest Ref Range: 0 - 41 U/L 320 (H) 129 (H) 94 (H) 194 (H)   AST (SGOT) Latest Ref Range: 0 - 40 U/L 405 (H) 146 (H) 118 (H) 241 (H)   Bilirubin, Dir Latest Ref Range: <0.2 mg/dL   0.2    Bilirubin, Tot Latest Ref Range: <1.2 mg/dL 0.75 0.68 0.67 0.62   Albumin Latest Ref Range: 3.5 - 5.2 g/dL 4.4 4.5 4.5 4.6   Total Protein Latest Ref Range: 6.0 - 8.0 g/dL 7.8 8.1 (H) 7.5 7.7   Glycated Hemoglobin Latest Ref Range: 4.8 - 5.8 % 5.6   5.6   Triglycerides Latest Ref Range: 10 - 170 mg/dL 115   118   Cholesterol Latest Ref Range: <200 mg/dL 232 (H)   199   HDL-Cholesterol Latest Units: mg/dL 51   74   Non-HDL Cholesterol Latest Units: mg/dL 181   125   LDL-Chol (Calc) Latest Ref Range: <160 mg/dL 158   101     [place] Abd 10/01/15    FINDINGS:  The liver measures 21 cm in long axis. It is hyperechoic in echogenicity. .   There is no intra or extra hepatic bile duct dilation. The common bile duct   measures 4 mm. The gallbladder is surgically absent.    The right kidney measures 12 cm and the left kidney measures 12 cm in long   axis. Both are within normal limits with no hydronephrosis or renal calculi.    The visualized portion of the pancreas is within normal limits. The spleen is   normal measuring 11 cm in long axis. No ascites is seen. The visualized aorta   and inferior vena cava are within normal limits.    IMPRESSION:    Fatty enlarged liver.    Mild splenomegaly.      Routing to L. Celesta Aver NP for scheduling options.

## 2018-06-14 ENCOUNTER — Other Ambulatory Visit: Payer: Commercial Managed Care - HMO | Attending: Family Medicine

## 2018-06-14 DIAGNOSIS — K760 Fatty (change of) liver, not elsewhere classified: Principal | ICD-10-CM | POA: Insufficient documentation

## 2018-06-14 DIAGNOSIS — R109 Unspecified abdominal pain: Secondary | ICD-10-CM | POA: Insufficient documentation

## 2018-06-14 LAB — CBC WITH DIFF, BLOOD
ANC-Automated: 4.2 10*3/uL (ref 1.6–7.0)
Abs Basophils: 0.1 10*3/uL (ref <0.1)
Abs Eosinophils: 0.2 10*3/uL (ref 0.1–0.5)
Abs Lymphs: 1.7 10*3/uL (ref 0.8–3.1)
Abs Monos: 0.5 10*3/uL (ref 0.2–0.8)
Basophils: 1 %
Eosinophils: 3 %
Hct: 48.4 % (ref 40.0–50.0)
Hgb: 16 gm/dL (ref 13.7–17.5)
Lymphocytes: 25 %
MCH: 31.6 pg (ref 26.0–32.0)
MCHC: 33.1 g/dL (ref 32.0–36.0)
MCV: 95.7 um3 — ABNORMAL HIGH (ref 79.0–95.0)
MPV: 10.1 fL (ref 9.4–12.4)
Monocytes: 8 %
Plt Count: 125 10*3/uL — ABNORMAL LOW (ref 140–370)
RBC: 5.06 10*6/uL (ref 4.60–6.10)
RDW: 14.7 % — ABNORMAL HIGH (ref 12.0–14.0)
Segs: 63 %
WBC: 6.7 10*3/uL (ref 4.0–10.0)

## 2018-06-14 LAB — COMPREHENSIVE METABOLIC PANEL, BLOOD
ALT (SGPT): 109 U/L — ABNORMAL HIGH (ref 0–41)
AST (SGOT): 209 U/L — ABNORMAL HIGH (ref 0–40)
Albumin: 4.5 g/dL (ref 3.5–5.2)
Alkaline Phos: 92 U/L (ref 40–129)
Anion Gap: 15 mmol/L (ref 7–15)
BUN: 7 mg/dL (ref 6–20)
Bicarbonate: 26 mmol/L (ref 22–29)
Bilirubin, Tot: 1.26 mg/dL — ABNORMAL HIGH (ref <1.2)
Calcium: 10.1 mg/dL (ref 8.5–10.6)
Chloride: 95 mmol/L — ABNORMAL LOW (ref 98–107)
Creatinine: 0.73 mg/dL (ref 0.67–1.17)
GFR: >60 mL/min
Glucose: 140 mg/dL — ABNORMAL HIGH (ref 70–99)
Potassium: 4.3 mmol/L (ref 3.5–5.1)
Sodium: 136 mmol/L (ref 136–145)
Total Protein: 8.3 g/dL — ABNORMAL HIGH (ref 6.0–8.0)

## 2018-06-14 LAB — LIPASE, BLOOD: Lipase: 23 U/L (ref 13–60)

## 2018-06-14 NOTE — Telephone Encounter (Signed)
Advise to discontinue alcohol. Not urgent appointment. Not a candidate for NAFLD clinic.     Please schedule with first available Hepatologist or NP.    Routing to call center.

## 2018-06-14 NOTE — Interdisciplinary (Signed)
Blood drawn from left arm with 21 gauge needle. 2 tubes taken.   Patient identity authenticated by [name].

## 2018-06-15 ENCOUNTER — Encounter (HOSPITAL_COMMUNITY): Payer: Self-pay

## 2018-06-15 NOTE — Telephone Encounter (Signed)
Called and left detailed message for pt to call and schedule appt. Provided best call back number for pt to call and schedule.

## 2018-06-16 ENCOUNTER — Ambulatory Visit (HOSPITAL_BASED_OUTPATIENT_CLINIC_OR_DEPARTMENT_OTHER): Payer: Commercial Managed Care - HMO | Admitting: Gastroenterology

## 2018-06-16 NOTE — Telephone Encounter (Signed)
Pt returned call and scheduled for 06/17/18 w Dr Carey Bullocks at [facility] 2:30pm. Provided address to pt.

## 2018-06-17 ENCOUNTER — Encounter (INDEPENDENT_AMBULATORY_CARE_PROVIDER_SITE_OTHER): Payer: Self-pay | Admitting: Family Medicine

## 2018-06-17 ENCOUNTER — Encounter (INDEPENDENT_AMBULATORY_CARE_PROVIDER_SITE_OTHER): Payer: Commercial Managed Care - HMO | Admitting: Gastroenterology

## 2018-06-17 ENCOUNTER — Encounter (INDEPENDENT_AMBULATORY_CARE_PROVIDER_SITE_OTHER): Payer: Self-pay | Admitting: Gastroenterology

## 2018-06-17 NOTE — Progress Notes (Deleted)
Brett Palmer is a 52 year old male seen for an initial consultation at the request of [name] for "fatty liver."    History of Present Illness:  The patient is a 52 year old male with a history of {OBESITY ASSOCIATED CONDITIONS:20320:o}, seen for evaluation of abnormal liver tests ***.    The patient has a history of abnormal ALT since 2011, with peak ALT of 367 in 2015, and most recent ALT of 109 on 06/14/18. Abdominal ultrasound on 09/2015 showed fatty liver, mild splenomegaly    The patient has *** history of alcohol use ***. Risk factors for nonalcoholic fatty liver disease include ***. He has *** had a liver biopsy.    The patient reports that He exercises *** times per week for *** minutes doing ***. He does *** drink juices, sodas and does *** adhere to a low carbohydrate, low saturated fat diet.     {He/she (caps):30048:o} lipids have *** been tested. {He/she (caps):30048:o} HbA1c is *** on ***.    The following serologic evaluation to date includes:  ANA: ***  Anti-smooth muscle antibody: ***  IgG: ***  Ferritin: ***  Transferrin saturation: ***  Alpha-1-antitrypsin: ***  Ceruloplasmin: ***  HBsAg: neg  HBsAb: 15  HBcAb total: ***  HCV Ab: neg      Past Medical History:   Diagnosis Date    Gastroesophageal reflux disease     Migraine headache     part of gulf war syndrome    PTSD (post-traumatic stress disorder) 1995-present    [facility] Joen [facility]     SVT (supraventricular tachycardia) (CMS-HCC) 2011       Past Surgical History:   Procedure Laterality Date    GALLBLADDER SURGERY  2011       Current Outpatient Medications   Medication Sig Dispense Refill    albuterol 108 (90 BASE) MCG/ACT inhaler Inhale 2 puffs by mouth every 6 hours as needed for Wheezing. 1 Inhaler 2    ALPRAZolam (XANAX) 1 MG tablet Take 1 tablet (1 mg) by mouth 3 times daily as needed for Anxiety. 12 tablet 0    Esomeprazole Magnesium (NEXIUM PO) daily.      fluticasone propionate (FLONASE) 50 MCG/ACT nasal spray  Spray 2 sprays into each nostril daily. 1 bottle 11    hydrochlorothiazide (HYDRODIURIL) 12.5 MG tablet Take 1 tablet (12.5 mg) by mouth daily. 30 tablet 1    PEG 3350 with electrolytes (COLYTE) 240 g solution 4061ms by mouth per written instruction provided by GI clinic for split dose bowel preparation 1 bottle 0    zolpidem (AMBIEN) 10 MG tablet Take 10 mg by mouth nightly as needed for Insomnia.       No current facility-administered medications for this visit.        Allergies: He is allergic to compazine and latex.    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   Social Needs    Financial resource strain: Not on file    Food insecurity:     Worry: Not on file     Inability: Not on file    Transportation needs:  Medical: Not on file     Non-medical: Not on file   Tobacco Use    Smoking status: Former Smoker     Packs/day: 0.50     Years: 10.00     Pack years: 5.00     Types: Cigarettes     Last attempt to quit: 01/11/1990     Years since quitting: 28.4    Smokeless tobacco: Never Used   Substance and Sexual Activity    Alcohol use: Yes     Comment: down to 1/2-3/4 bottle of wine    Drug use: No     Comment: Denies MJ use    Sexual activity: Yes     Partners: Female   Lifestyle    Physical activity:     Days per week: Not on file     Minutes per session: Not on file    Stress: Not on file   Relationships    Social connections:     Talks on phone: Not on file     Gets together: Not on file     Attends religious service: Not on file     Active member of club or organization: Not on file     Attends meetings of clubs or organizations: Not on file     Relationship status: Not on file    Intimate partner violence:     Fear of current or ex partner:  Not on file     Emotionally abused: Not on file     Physically abused: Not on file     Forced sexual activity: Not on file   Other Topics Concern    Not on file   Social History Narrative    Not on file       Family History: His family history includes Cancer in his father; Heart Disease in his maternal aunt and mother.    Review of Systems: He reports ***. He denies jaundice, gastrointestinal bleeding, fluid retention or overt confusion. All other systems were reviewed and are negative.    Physical Exam:  Vital Signs: There were no vitals taken for this visit.  Constitutional: He is in no apparent distress.  Eyes: Anicteric sclerae.  Ears, Nose, Mouth, Throat: Moist mucous membranes, no oral lesions.  Respiratory: Clear lungs to auscultation bilaterally.  Cardiovascular: Normal S1 and S2, regular rate and rhythm, no murmurs, rubs or gallops. No peripheral edema.  Gastrointestinal: Soft, nontender abdomen without hepatosplenomegaly, hernias or masses.  Hem/Lymphatic: No cervical or supraclavicular lymphadenopathy.  Musculoskeletal: No clubbing or cyanosis of hands. Normal range of motion in upper and lower extremities.  Skin: No cutaneous stigmata of chronic liver disease, no rashes.  Neurologic: No asterixis.  Psychiatric: Alert and oriented to person, place and time. Normal affect.    Records Review: I have personally reviewed records from ***. These are summarized within the history of present illness.    Labs: I have personally reviewed and interpreted the following laboratory studies:  [facility]:  Labs: Following labs reviewed  Lab Results   Component Value Date    AST 209 (H) 06/14/2018    ALT 109 (H) 06/14/2018    GGT 297 (H) 11/17/2011    ALK 92 06/14/2018    TP 8.3 (H) 06/14/2018    ALB 4.5 06/14/2018    TBILI 1.26 (H) 06/14/2018    DBILI 0.2 02/24/2017      Lab Results   Component Value Date    WBC 6.7 06/14/2018    RBC  5.06 06/14/2018    HGB 16.0 06/14/2018    HCT 48.4 06/14/2018    MCV 95.7 (H)  06/14/2018    MCHC 33.1 06/14/2018    RDW 14.7 (H) 06/14/2018    PLT 125 (L) 06/14/2018    MPV 10.1 06/14/2018      Lab Results   Component Value Date    NA 136 06/14/2018    K 4.3 06/14/2018    CL 95 (L) 06/14/2018    BICARB 26 06/14/2018    BUN 7 06/14/2018    CREAT 0.73 06/14/2018    GLU 140 (H) 06/14/2018    [place] 10.1 06/14/2018      Lab Results   Component Value Date    BUN 7 06/14/2018    CREAT 0.73 06/14/2018    CL 95 (L) 06/14/2018    NA 136 06/14/2018    K 4.3 06/14/2018    [place] 10.1 06/14/2018    TBILI 1.26 (H) 06/14/2018    ALB 4.5 06/14/2018    TP 8.3 (H) 06/14/2018    AST 209 (H) 06/14/2018    ALK 92 06/14/2018    BICARB 26 06/14/2018    ALT 109 (H) 06/14/2018    GLU 140 (H) 06/14/2018      Outside Labs:    I have reviewed the following studies:    I have personally reviewed and interpreted *** and my findings are ***.  ?  ASSESSMENT AND PLAN:  OSHAE SIMMERING is a 52 year old male with a history of SVT.    Elevated ALT, fatty liver: The differential diagnosis for abnormal liver tests in this patient includes nonalcoholic fatty liver disease, autoimmune hepatitis, hemochromatosis, Wilson's disease, and alpha-1-antitrypsin deficiency. {He/she (caps):30048:o} had no evidence of viral hepatitis on serologic testing in ***. {He/she (caps):30048:o} has had only *** alcohol intake, so I do *** think that alcohol is playing a role, but I have advised {him/her:23833:o} to completely abstain from alcohol for the time being. The most likely cause of fatty liver in this patient is nonalcoholic fatty liver disease (NAFLD), given that {HE/SHE LOWER CASE:27673:o} has ***.    Counseling  The mainstay of treatment of NAFLD includes lifestyle modification to achieve weight loss, at least 7% of current body weight. Low carbohydrate diets can be beneficial in improving NAFLD liver histology. Additionally, exercise, even the absence of weight loss can have beneficial effects on the patient's metabolic profile and liver health.  Liver biopsy is required to distinguish between so-called "bland" steatosis (no inflammation) and steatohepatitis (NASH), and provides staging of fibrosis severity. Vitamin E has been demonstrated to improve hepatic steatosis and inflammation in select populations NASH. Only patients with biopsy-proven NASH should be treated with vitamin E. Vitamin E also is associated with an increased risk of prostate cancer and hemorrhage, particularly in the setting of poorly controlled HTN.    Workup  To evaluate for causes of chronic liver disease other than NAFLD, I have ordered the following tests: ANA, anti-smooth muscle antibody, total IgG, ferritin, alpha-1-antitrypsin, ceruloplasmin.     Staging  We discussed the role of liver biopsy, which is the only modality that can reliably discriminate between fatty liver and NASH.     Additionally, we have discussed methods to non-invasively stage fibrosis, their benefits and pitfalls and have *** ordered a Fibroscan to be done in our office to noninvasively assess hepatic fibrosis.    Metabolic syndrome with ***: Counseling for lifestyle modification as above. {He/she (caps):30048} should follow up with {  Desc; his/her:32168} PCP regarding ongoing management of these problems. Cardiovascular disease is the leading cause of mortality among patients with NAFLD. There is not an increased risk of drug induced liver injury from statins in patients with NAFLD, and this class of medicine should be prescribed if the patient has an indication.     Alcohol:   * Complete abstinence from alcohol is recommended.   * Please go to AA or SmartRecovery meetings three times a week. Get a signature every time you go in order to confirm your attendance. Bring signatures to every clinic visit. Participation in recovery for a period no less than six months is mandatory if you would like to be considered for a liver transplant in the future. Therapy with a licensed professional approved by the  transplant team may also be acceptable as long as a release of information can be signed to confirm attendance.     Healthcare maintenance: The patient should be vaccinated against hepatitis A and B if {HE/SHE (LOWER CASE):23834} is not already immune.    Follow-up in *** months.    '@AVSGIVEN'$ @    A copy of this consultation letter was faxed/routed to Dr. [name]    Michel Santee, MD  Gastroenterology & Hepatology Fellow PGY6

## 2018-06-18 NOTE — Telephone Encounter (Signed)
From: Jackalyn Lombard  To: Waymon Amato, MD  Sent: 06/11/2018 12:55 PM PDT  Subject: 1-Non Urgent Medical Advice    I could not get into the Lab it was packed and I had a 12pm apt. Will do it Monday. Thank you for your great care!  Best regards  [facility]

## 2018-06-22 ENCOUNTER — Encounter (INDEPENDENT_AMBULATORY_CARE_PROVIDER_SITE_OTHER): Payer: Self-pay | Admitting: Family Medicine

## 2018-06-29 ENCOUNTER — Encounter (HOSPITAL_BASED_OUTPATIENT_CLINIC_OR_DEPARTMENT_OTHER): Payer: Self-pay

## 2018-06-29 ENCOUNTER — Ambulatory Visit
Admission: RE | Admit: 2018-06-29 | Discharge: 2018-06-29 | Disposition: A | Discharge status: Home / Self Care | Payer: Commercial Managed Care - HMO | Attending: Gastroenterology | Admitting: Gastroenterology

## 2018-06-29 ENCOUNTER — Encounter (HOSPITAL_BASED_OUTPATIENT_CLINIC_OR_DEPARTMENT_OTHER): Admission: RE | Disposition: A | Discharge status: Home Health | Payer: Self-pay | Attending: Gastroenterology

## 2018-06-29 DIAGNOSIS — Z1211 Encounter for screening for malignant neoplasm of colon: Principal | ICD-10-CM | POA: Insufficient documentation

## 2018-06-29 DIAGNOSIS — K219 Gastro-esophageal reflux disease without esophagitis: Secondary | ICD-10-CM | POA: Insufficient documentation

## 2018-06-29 DIAGNOSIS — D120 Benign neoplasm of cecum: Secondary | ICD-10-CM | POA: Insufficient documentation

## 2018-06-29 DIAGNOSIS — Z888 Allergy status to other drugs, medicaments and biological substances status: Secondary | ICD-10-CM | POA: Insufficient documentation

## 2018-06-29 DIAGNOSIS — I10 Essential (primary) hypertension: Secondary | ICD-10-CM | POA: Insufficient documentation

## 2018-06-29 DIAGNOSIS — D123 Benign neoplasm of transverse colon: Secondary | ICD-10-CM | POA: Insufficient documentation

## 2018-06-29 DIAGNOSIS — K635 Polyp of colon: Secondary | ICD-10-CM | POA: Insufficient documentation

## 2018-06-29 DIAGNOSIS — Z79899 Other long term (current) drug therapy: Secondary | ICD-10-CM | POA: Insufficient documentation

## 2018-06-29 DIAGNOSIS — I471 Supraventricular tachycardia: Secondary | ICD-10-CM | POA: Insufficient documentation

## 2018-06-29 DIAGNOSIS — Z91040 Latex allergy status: Secondary | ICD-10-CM | POA: Insufficient documentation

## 2018-06-29 DIAGNOSIS — F4310 Post-traumatic stress disorder, unspecified: Secondary | ICD-10-CM | POA: Insufficient documentation

## 2018-06-29 SURGERY — COLONOSCOPY
Anesthesia: Moderate Sedation - by non-anesthesia staff only

## 2018-06-29 MED ORDER — MEPERIDINE HCL 100 MG/2ML IJ SOLN
INTRAMUSCULAR | Status: DC | PRN
Start: 2018-06-29 — End: 2018-06-29
  Administered 2018-06-29: 50 mg via INTRAVENOUS
  Administered 2018-06-29 (×2): 25 mg via INTRAVENOUS

## 2018-06-29 MED ORDER — MIDAZOLAM HCL 5 MG/5ML IJ SOLN
INTRAMUSCULAR | Status: DC | PRN
Start: 2018-06-29 — End: 2018-06-29
  Administered 2018-06-29: 2 mg via INTRAVENOUS
  Administered 2018-06-29: 1 mg via INTRAVENOUS
  Administered 2018-06-29: 2 mg via INTRAVENOUS

## 2018-06-29 MED ORDER — SODIUM CHLORIDE 0.9 % IV SOLN
INTRAVENOUS | Status: AC | PRN
Start: 2018-06-29 — End: 2018-06-29
  Administered 2018-06-29: 500 mL via INTRAVENOUS

## 2018-06-29 SURGICAL SUPPLY — 3 items
BIOPSY FORCEP RADIAL JAW 4 2.8MM X 240CM, LARGE W/NEEDLE (Needles/punch/cannula/biopsy) ×2
SNARE COLD CAPTIVATOR 10MM ROUNDED STIFF (Misc Surgical Supply) ×1 IMPLANT
SNARE COLD CAPTIVATOR 10MM ROUNDED STIFF 10/BX (Misc Surgical Supply) ×1

## 2018-06-29 NOTE — H&P (Signed)
History and Physical    Indication for procedure:  Colon cancer screening    Pain Score: 0          Past Medical History:   Diagnosis Date    Alcohol use     Gastroesophageal reflux disease     Hypertension     Migraine headache     part of gulf war syndrome    PTSD (post-traumatic stress disorder) 1995-present    treated by Dr. Joen Laura at [facility]     SVT (supraventricular tachycardia) (CMS-HCC) 2011     Past Surgical History:   Procedure Laterality Date    GALLBLADDER SURGERY  2011     Allergies   Allergen Reactions    Compazine Anxiety    Latex Rash     Prior to Admission Medications   Prescriptions Last Dose Informant Patient Reported? Taking?   ALPRAZolam (XANAX) 1 MG tablet   No No   Sig: Take 1 tablet (1 mg) by mouth 3 times daily as needed for Anxiety.   Esomeprazole Magnesium (NEXIUM PO)   Yes No   Sig: daily.   PEG 3350 with electrolytes (COLYTE) 240 g solution   No No   Sig: 4029mls by mouth per written instruction provided by GI clinic for split dose bowel preparation   albuterol 108 (90 BASE) MCG/ACT inhaler   No No   Sig: Inhale 2 puffs by mouth every 6 hours as needed for Wheezing.   fluticasone propionate (FLONASE) 50 MCG/ACT nasal spray   No No   Sig: Spray 2 sprays into each nostril daily.   hydrochlorothiazide (HYDRODIURIL) 12.5 MG tablet   No No   Sig: Take 1 tablet (12.5 mg) by mouth daily.   zolpidem (AMBIEN) 10 MG tablet   Yes No   Sig: Take 10 mg by mouth nightly as needed for Insomnia.      Facility-Administered Medications: None       BP 128/87    Pulse 96    Resp 16    SpO2 95%   General: Well developed, well nourished, in no apparent distress.  Lungs: Clear breath sounds bilaterally.  CV: Normal rate, regular rhythm, no significant murmur present.  Abdomen: Soft, nontender, normal bowel sounds present.    ASA Score:  2   Airway (Mallimpati) Score:  Class II - Soft palate, uvula, and fauces are visible.    Assessment and Plan  Proceed to planned procedure.    The patient has consented  to the procedure, which will be done with sedation.  I have assessed the patient's status immediately prior to this procedure.  I have discussed pain management needs and options for the patient with the patient or caregiver.      The patient agrees to be full code for the duration of the procedure.    Sedation options, risks, and plans have been discussed with the patient or caregiver.  Questions were answered.  The patient or caregiver agrees to proceed as planned.    Deeann Cree

## 2018-06-29 NOTE — Discharge Instructions (Signed)
Discharge instructions given to patient. See [employer] report. Patient verbalized understanding. All belongings sent home with patient.

## 2018-07-01 ENCOUNTER — Encounter (INDEPENDENT_AMBULATORY_CARE_PROVIDER_SITE_OTHER): Payer: Self-pay | Admitting: Gastroenterology

## 2018-07-01 ENCOUNTER — Ambulatory Visit (HOSPITAL_BASED_OUTPATIENT_CLINIC_OR_DEPARTMENT_OTHER): Payer: Commercial Managed Care - HMO

## 2018-07-01 ENCOUNTER — Telehealth (INDEPENDENT_AMBULATORY_CARE_PROVIDER_SITE_OTHER): Payer: Self-pay | Admitting: Gastroenterology

## 2018-07-01 NOTE — Telephone Encounter (Addendum)
Caller: Haydn Hutsell   Phone #: [phone]  Relationship to patient: Self  Reason: post procedure sx   Procedure date: 06/29/18  New SX  Pt reports the following SX... feeling the urge to evacuate (9 times today) reports 0 BM's today and denies passing gas. Experiencing nausea all day today and abdominal cramping below the navel. Patient reports that this morning he had bright red blood clots come out and this afternoon only specs of bright blood.    Duration of SX: 1 day  PA: 7/10  Denies: F, C, V, D   Outcome: Call connected to GI RN.

## 2018-07-01 NOTE — Telephone Encounter (Signed)
Received call from pt.  Verified ID with 2 pt identifiers, and notified pt that calls are being recorded and monitored for quality purposes.     Pt had colonoscopy with Dr Rozelle Logan yesterday and says he felt fine after the procedure, but this morning he began to feel nauseated.  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  Says he noticed approximately 1 tsp of coagulated matter in the toilet bowl this am.  Notes he hasn't eaten much; just some "light solid last night".  Reports LLQ discomfort that radiates to 3 inches by his belly button.  Describes it as constant sharp pain.  Has tenesmus, but no stools.  Pt does not feel his pain warrants a trip to the ER, but is aware of ER precautions.  Notes he had 2 "bm's" this am, but says it wasn't fecal matter.  Pt called in regards to the uncomfortable abd pain, and is requesting any recommendation.      Discussed effects of bowel prep, liquid diet, and procedure.  Told pt that his body is trying to return to normal, so he should slowly resume his regular diet (to ward off nausea and raise his blood sugar levels).  Advised pt drink fluids (water, sports drinks, apple juice, etc) and avoid heavy/greasy/spicy foods.  Told pt that it is likely he is feeling trapped gas from the procedure, and he acknowledged.  Told pt he can try OTC simethicone, and can also try lying on his left side or stomach to help facilitate the passage of gas.  Told pt that he may feel some discomfort as the gas moves, but that the pain should subside once eliminated.  Told him he can call us again if needed.  Pt verbalized understanding and appreciated the help.

## 2018-07-08 ENCOUNTER — Ambulatory Visit
Admission: RE | Admit: 2018-07-08 | Discharge: 2018-07-08 | Disposition: A | Discharge status: Home / Self Care | Payer: Commercial Managed Care - HMO | Attending: Diagnostic Radiology | Admitting: Diagnostic Radiology

## 2018-07-08 ENCOUNTER — Encounter (INDEPENDENT_AMBULATORY_CARE_PROVIDER_SITE_OTHER): Payer: Self-pay | Admitting: Family Medicine

## 2018-07-08 DIAGNOSIS — K760 Fatty (change of) liver, not elsewhere classified: Principal | ICD-10-CM | POA: Insufficient documentation

## 2018-07-15 ENCOUNTER — Telehealth (HOSPITAL_BASED_OUTPATIENT_CLINIC_OR_DEPARTMENT_OTHER): Payer: Self-pay | Admitting: Transplant Hepatology

## 2018-07-15 NOTE — Telephone Encounter (Signed)
Pre-clinic 07/21/2018 chart review:    52 yo male with fatty liver, transaminitis since 2011, referred by Dr Alda Ponder [facility]. History obesity (BMI 36), HTN, PTSD/ anxiety, ETOH abuse.    [place] ABD 07/08/2018:  IMPRESSION:  Fatty liver.  Hepatosplenomegaly.     02/24/2017 12:30 05/29/2017 12:15 06/14/2018 13:10   Alkaline Phos 83 112 92   ALT (SGPT) 94 (H) 194 (H) 109 (H)   AST (SGOT) 118 (H) 241 (H) 209 (H)   Bilirubin, Dir 0.2     Bilirubin, Tot 0.67 0.62 1.26 (H)   Albumin 4.5 4.6 4.5   Total Protein 7.5 7.7 8.3 (H)

## 2018-07-21 ENCOUNTER — Encounter (INDEPENDENT_AMBULATORY_CARE_PROVIDER_SITE_OTHER): Payer: Self-pay | Admitting: Transplant Hepatology

## 2018-07-21 ENCOUNTER — Other Ambulatory Visit (INDEPENDENT_AMBULATORY_CARE_PROVIDER_SITE_OTHER): Payer: Commercial Managed Care - HMO | Attending: Transplant Hepatology

## 2018-07-21 ENCOUNTER — Ambulatory Visit (INDEPENDENT_AMBULATORY_CARE_PROVIDER_SITE_OTHER): Payer: Commercial Managed Care - HMO | Admitting: Transplant Hepatology

## 2018-07-21 ENCOUNTER — Encounter (HOSPITAL_BASED_OUTPATIENT_CLINIC_OR_DEPARTMENT_OTHER): Payer: Self-pay

## 2018-07-21 VITALS — BP 152/72 | HR 83 | Temp 98.2°F | Resp 20 | Ht 72.0 in | Wt 267.9 lb

## 2018-07-21 DIAGNOSIS — R160 Hepatomegaly, not elsewhere classified: Secondary | ICD-10-CM

## 2018-07-21 DIAGNOSIS — R7401 Elevation of levels of liver transaminase levels: Secondary | ICD-10-CM

## 2018-07-21 DIAGNOSIS — K591 Functional diarrhea: Secondary | ICD-10-CM

## 2018-07-21 DIAGNOSIS — R771 Abnormality of globulin: Secondary | ICD-10-CM

## 2018-07-21 DIAGNOSIS — R740 Nonspecific elevation of levels of transaminase and lactic acid dehydrogenase [LDH]: Principal | ICD-10-CM

## 2018-07-21 DIAGNOSIS — K760 Fatty (change of) liver, not elsewhere classified: Secondary | ICD-10-CM

## 2018-07-21 DIAGNOSIS — K709 Alcoholic liver disease, unspecified: Secondary | ICD-10-CM

## 2018-07-21 DIAGNOSIS — F4312 Post-traumatic stress disorder, chronic: Secondary | ICD-10-CM

## 2018-07-21 LAB — COMPREHENSIVE METABOLIC PANEL, BLOOD
ALT (SGPT): 71 U/L — ABNORMAL HIGH (ref 0–41)
AST (SGOT): 139 U/L — ABNORMAL HIGH (ref 0–40)
Albumin: 4.5 g/dL (ref 3.5–5.2)
Alkaline Phos: 101 U/L (ref 40–129)
Anion Gap: 16 mmol/L — ABNORMAL HIGH (ref 7–15)
BUN: 5 mg/dL — ABNORMAL LOW (ref 6–20)
Bicarbonate: 25 mmol/L (ref 22–29)
Bilirubin, Tot: 1.06 mg/dL (ref <1.2)
Calcium: 9.5 mg/dL (ref 8.5–10.6)
Chloride: 96 mmol/L — ABNORMAL LOW (ref 98–107)
Creatinine: 0.89 mg/dL (ref 0.67–1.17)
GFR: >60 mL/min
Glucose: 99 mg/dL (ref 70–99)
Potassium: 5.1 mmol/L (ref 3.5–5.1)
Sodium: 137 mmol/L (ref 136–145)
Total Protein: 8.2 g/dL — ABNORMAL HIGH (ref 6.0–8.0)

## 2018-07-21 LAB — CBC WITH DIFF, BLOOD
ANC-Automated: 4.4 10*3/uL (ref 1.6–7.0)
Abs Basophils: 0 10*3/uL (ref <0.1)
Abs Eosinophils: 0.2 10*3/uL (ref 0.1–0.5)
Abs Lymphs: 1.8 10*3/uL (ref 0.8–3.1)
Abs Monos: 0.6 10*3/uL (ref 0.2–0.8)
Basophils: 1 %
Eosinophils: 3 %
Hct: 48.6 % (ref 40.0–50.0)
Hgb: 15.8 gm/dL (ref 13.7–17.5)
Lymphocytes: 26 %
MCH: 31.4 pg (ref 26.0–32.0)
MCHC: 32.5 g/dL (ref 32.0–36.0)
MCV: 96.6 um3 — ABNORMAL HIGH (ref 79.0–95.0)
MPV: 9.9 fL (ref 9.4–12.4)
Monocytes: 8 %
Plt Count: 129 10*3/uL — ABNORMAL LOW (ref 140–370)
RBC: 5.03 10*6/uL (ref 4.60–6.10)
RDW: 13.9 % (ref 12.0–14.0)
Segs: 62 %
WBC: 7.1 10*3/uL (ref 4.0–10.0)

## 2018-07-21 LAB — IBC - IRON BINDING CAPACITY
Iron Saturation: 26 %
Iron: 94 ug/dL (ref 59–158)
Total IBC: 359 ug/dL (ref 148–506)
UIBC: 265 ug/dL (ref 112–346)

## 2018-07-21 LAB — HEPATITIS B SURFACE AG, BLOOD: HBsAg: NONREACTIVE

## 2018-07-21 LAB — HEPATITIS C AB, BLOOD: Hepatitis C Ab: NONREACTIVE

## 2018-07-21 LAB — LIPID(CHOL FRACT) PANEL, BLOOD
Cholesterol: 221 mg/dL (ref <200)
HDL-Cholesterol: 48 mg/dL
LDL-Chol (Calc): 154 mg/dL (ref <160)
Non-HDL Cholesterol: 173 mg/dL
Triglycerides: 95 mg/dL (ref 10–170)

## 2018-07-21 LAB — PROTHROMBIN TIME, BLOOD
INR: 1.2
PT,Patient: 13.2 s — ABNORMAL HIGH (ref 9.7–12.5)

## 2018-07-21 LAB — HEPATITIS B CORE IGG/IGM PANEL, BLOOD
HBcAb IgM: NONREACTIVE
HBcAb Total: NONREACTIVE

## 2018-07-21 LAB — IMMUNOGLOBULIN PANEL (IGA,IGG,IGM), BLOOD
IGA: 321 mg/dL (ref 70–400)
IGG: 1430 mg/dL (ref 700–1600)
IGM: 108 mg/dL (ref 40–230)

## 2018-07-21 LAB — FERRITIN, BLOOD: Ferritin: 302 ng/mL (ref 30–400)

## 2018-07-21 LAB — HEPATITIS B SURFACE AB, QUANT, BLOOD: HBsAb,Qt: 6.6 m[IU]/mL

## 2018-07-21 LAB — HEP A TOTAL ANTIBODY: Hepatitis A Antibody: NONREACTIVE

## 2018-07-21 LAB — GLYCOSYLATED HGB(A1C), BLOOD: Glyco Hgb (A1C): 5.8 % (ref 4.8–5.8)

## 2018-07-21 LAB — GAMMA GLUTAMYL TRANSFERASE, BLOOD: GGT: 452 U/L — ABNORMAL HIGH (ref 0–60)

## 2018-07-21 LAB — TSH, BLOOD: TSH: 4.8 u[IU]/mL — ABNORMAL HIGH (ref 0.27–4.20)

## 2018-07-21 NOTE — Patient Instructions (Signed)
1)Your liver tests are up, and I am concerned that you may have cirrhosis. In order to determine why your liver tests are up, we will do further bloodwork and an MRI to check how much scarring there is in your liver  2) Please try to cut down on your alcohol intake. Cut down by 50% every 2 weeks (so 1 bottle of wine a day for 2 weeks, then half a bottle of wine a day for 2 weeks and so on). If you continue drinking this much on a regular basis, it may push you into liver failure  3) I will see you in 2-3 month

## 2018-07-21 NOTE — Progress Notes (Signed)
Brett Palmer  HEPATOLOGIST: Brett Greenhouse, MD    Date / Time: 07/21/2018 1:23 PM    Referring provider: Waymon Palmer     Reason for visit: Fatty liver    Type of visit: New patient      Past Medical History:  Past Medical History:   Diagnosis Date   . Alcohol use    . Gastroesophageal reflux disease    . Hypertension    . Migraine headache     part of gulf war syndrome   . PTSD (post-traumatic stress disorder) 1995-present    treated by Brett Palmer at [facility]    . SVT (supraventricular tachycardia) (CMS-HCC) 2011       Past Surgical History:  Past Surgical History:   Procedure Laterality Date   . GALLBLADDER SURGERY  2011       Current Medications:    albuterol 108 (90 BASE) MCG/ACT inhaler Inhale 2 puffs by mouth every 6 hours as needed for Wheezing.   ALPRAZolam (XANAX) 1 MG tablet Take 1 tablet (1 mg) by mouth 3 times daily as needed for Anxiety.   Esomeprazole Magnesium (NEXIUM PO) daily.   fluticasone propionate (FLONASE) 50 MCG/ACT nasal spray Spray 2 sprays into each nostril daily.   hydrochlorothiazide (HYDRODIURIL) 12.5 MG tablet Take 1 tablet (12.5 mg) by mouth daily.   PEG 3350 with electrolytes (COLYTE) 240 g solution 4034ms by mouth per written instruction provided by GI clinic for split dose bowel preparation   zolpidem (AMBIEN) 10 MG tablet Take 10 mg by mouth nightly as needed for Insomnia.       Allergy:  Allergies   Allergen Reactions   . Compazine Anxiety   . 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 He states that he would consume about 2 bottles a wine a day 7 days a  week. This would fluctuate to include hard liquor but after feeling "crummy" overall, he started only drinking wine by itself. His liver enzymes were first seen to be elevated in 2011 and then fluctuated with a peak in 2016 with AST and ALT in the 400s. He states during this time he was at his peak for consumption of alcohol. He has tried to cut back on the amount of alcohol in the past but everytime he does, he states that he gets very anxious and has to restart his drinking habits. States that he had 4 episodes of emesis on Monday. States that his uvela swells and triggers his gag reflex but his wife notes that he has had nausea for the past 2-3 weeks. [facility] showed a significantly enlarged liver to 22cm with fatty infiltration.     Drinking/Substance Abuse: Between 3p-10pm 2 bottles of wine everyday. Has PTSD/Anxiety and states he drinks to relax him, but states he has no cravings for alcohol. Current on Xanax, at night, intermitently will take during the day if he has an anxiety attack,  Takes Brett Palmer. Did not drink much before 2011, more social drinking.     Diet/Exercise: Chicken and beef twice a month. States that he doesn't eat fatty foods often because of his gallbladder pain. He does not eat out. Sauteed chicken with no oil, rice   Snacks between meals - Cheeze-its, nuts, dried fruit. Drinks body armour drinks. 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 Does not work out anymore.     Exposures/Previous Hx:  Exposed to [place] in [place]. 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 In 2016, noted to have SVT after he was drinking heavily and required ablation. Also noted to have HTN and is currently on HCTZ. D    He denies a history of symptoms suggestive of acute hepatitis. He denies any features of decompensated liver disease.There is not a history of pedal edema, ascites, jaundice or  GI bleeding.     Brett Palmer does not have diabetes. his most recent trends of blood glucose readings range around 100-120, and his most recent hemoglobin A1C is 5.6.  He is not compliant with a diabetic diet.    Risk factors for liver disease:  He does not have any risk factors for viral hepatitis.  - History of heavy alcohol use: Yes  - Metabolic:      -- elevated BMI:Yes, There is no height or weight on file to calculate BMI.     -- HLD: Yes     -- DMII: No     -- family history of DMII: No     -- family history of fatty liver: Yes  - DILI risks: relevant herbal, supplement, OTC medications: Yes, takes milk thistle, vitamin D, Vitamin B, potassium  - Autoimmune disease: No  - Inflammatory bowel disease: No  - History of familial liver disease: Yes, most with fatty liver disease     Dietary supplement use: yes      Family history:   Family History   Problem Relation Name Age of Onset   . Cancer Father          melanoma, died at age 50   . Heart Disease Mother          CHF, thought to have passed from MI   . Heart Disease Maternal Aunt          CHF       Social history:  Social History     Socioeconomic History   . Marital status: Married     Spouse name: Brett Palmer   . Number of children: 2   . Years of education: Not on file   . Highest education level: Not on file   Occupational History   . 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 Smoking status: Former Smoker     Packs/day: 0.50     Years: 10.00     Pack years: 5.00     Types: Cigarettes     Last attempt to quit: 01/11/1990     Years since quitting: 28.5   . Smokeless tobacco: Never Used   Substance and Sexual Activity   . Alcohol use: Yes     Comment: down to 1/2-3/4 bottle of wine   . Drug use: No     Comment: Denies MJ use   . Sexual activity: Yes     Partners: Female   Social  Activities of Daily Living Present   . Not on file   Social History Narrative   . Not on file     Current Living Situation: Lives with wife at  home  Employment: None      Review of Systems:  Weight loss:yes  Trouble breathing: yes  Abdominal pain: yes, intermittent  Bowel movement frequency: 1-2 times a day but can be as much as 5-6 a day  Nausea / vomitting: yes  Thyroid problems: no  Skin rash: Dry skin on the side of his face  Ascites:no  Confusion: no  Rest of the 12 point review of system was negative. Please refer to the HPI section for the pertinent positives and negatives related to the presenting complaint.    No ascites, edema, no hematemesis, hematochezia, melena, no jaundice, pruritis or acholic stools, no hepatic encephalopathy (no overt encephalopathy, no reversal of sleep/wake cycles, no memory/attention issues).     Physical examination:   There were no vitals taken for this visit.   Wt Readings from Last 2 Encounters:   06/11/18 122.9 kg (271 lb)   03/23/18 122.9 kg (271 lb)    Blood Pressure   06/29/18 117/90   06/11/18 139/85     Affect: Normal  General Appearance: Alert and oriented x 3 alert, no distress, pleasant affect, cooperative.  Eyes:  No scleral icterus, no conjunctival pallor, mucus membranes moist, pupils equal, round, reactive to light  Mouth: No scleral icterus, no conjunctival pallor, mucus membranes moist, neck supple without lymphadenopathy, thrush noted on tongue{  Neck:  neck supple without lymphadenopathy, no thryroid abnormalities appreciated;   Heart:  No carotid bruit, regular rate and rhythm without murmurs, rubs or gallops  Lungs:Clear to auscultation and percussion, no chest deformities noted.   Abdomen: Bowel sounds present, Liver palpable about 5 cm below costal margin, spleen palpable in left abdomen   Extremities: No peripheral edema. No evidence of muscle wasting. no cyanosis, clubbing, .  Skin: Non-icteric, no palmar erythema, no spider angiomas, rash on the left side of his face anterior to pinna  Neuro:No asterixis  Gait normal. Reflexes normal and symmetric. Sensation and strength grossly  normal.    Studies:  Labs: Following labs reviewed  Lab Results   Component Value Date    WBC 6.7 06/14/2018    RBC 5.06 06/14/2018    HGB 16.0 06/14/2018    HCT 48.4 06/14/2018    MCV 95.7 (H) 06/14/2018    MCHC 33.1 06/14/2018    RDW 14.7 (H) 06/14/2018    PLT 125 (L) 06/14/2018    MPV 10.1 06/14/2018      Lab Results   Component Value Date    BUN 7 06/14/2018    CREAT 0.73 06/14/2018    CL 95 (L) 06/14/2018    NA 136 06/14/2018    K 4.3 06/14/2018    [place] 10.1 06/14/2018    TBILI 1.26 (H) 06/14/2018    ALB 4.5 06/14/2018    TP 8.3 (H) 06/14/2018    AST 209 (H) 06/14/2018    ALK 92 06/14/2018    BICARB 26 06/14/2018    ALT 109 (H) 06/14/2018    GLU 140 (H) 06/14/2018        Viral serologies/labs:  Lab Results   Component Value Date    HEPATITISCAB Nonreactive 11/17/2011      No results found for: HEPBSURFACAB  No results found for: HEPCRNA  No results found for: HEPBDNAQT     Imaging:  US abdomen (07/08/18)  LIVER: 22 cm in long axis. Increased in echogenicity.  BILIARY: Common bile duct measures 5.4 mm.  No intra or extra hepatic bile duct dilation.  GALLBLADDER: Normal with no calculi, sludge, or wall thickening.  RIGHT KIDNEY: 12.3 cm in long axis.  No hydronephrosis or renal calculi.  LEFT KIDNEY: 12.1 cm in long axis.  No hydronephrosis or renal calculi.  PANCREAS: Visualized portion within normal limits.  SPLEEN: 14.4 cm in long axis.  VASCULAR: Visualized aorta and inferior vena cava unremarkable.    Procedures:  Colonoscopy (06/29/18)  CF-h190 with cap. Good prep. normal terminal ileum. 19m sessile cecal polyp resected with cold snare. 52msessile trasnverse colon polyp resected with cold snare. Two 3-38m17messile sigmoid colon polyp removed with cold forceps.  ENDOSCOPIC DIAGNOSIS  Four small polyps removed  RECOMMENDATIONS  -likely repeat colonoscopy in 3-5 years based on biopsy results  -take miralax once a day for 5 days and if you are still constipated you can go up to 2 or 3 times a day until you are  having 1-2 good bowel movements per day  -avoid NSAIDs for a week    Ultrasound Abdomen (07/08/18)  IMPRESSION:  Fatty liver.  Hepatosplenomegaly.    Pathology:  Colonoscopy (06/29/18)  A: Transverse colon polyp, biopsy  -Tubular adenoma.  B: Cecum polyp, biopsy  -Sessile serrated adenoma.  C: Sigmoid colon polyp, biopsy  -Hyperplastic polyp.    Clinical Calculators:   MELD-Na: 11  CTP score.5  FIB-4: 8.26      IMPRESSIONS/PLAN:    Brett Palmer a [age]ar old male with hx of SVT s/p ablation, HTN, EtOH abuse, PTSD who comes to see us [place] for elevated liver enzymes and fatty infiltration on [place] is most concerning for alcoholic cirrhosis given his significant alcohol use since 2011, elevated liver enzymes, and low platelet count. His imaging does report that his liver is significantly enlarged which may indicate that alcohol is not the only process or if he is not fully cirrhotic. He does have other components that make it likely he has also has some NAFLD in conjunction given his weight but we primarily should focus on his alcohol use at this time. Additionally it is noted that he has an elevated protein level and may lead to infiltration as well.     #Alcoholic Liver Disease  - MRE/MRI ordered   - Patient states he has no difficulty with MRI  - Labs   - Metabolic: Iron panel + ferritin, CMP, CBC, a-1-antitrypsin,   - Autoimmune: AMA, Ig panel, ttg   - Infectious: Hepatitis serologies   - Counseled on cutting back alcohol intake    #Possible component of NAFLD  Given that his BMI is significantly elevated and has decrease exercise with his PTSD, he likely has some fatty infiltration in this regard as well.   - Repeat Lipid panel and A1C  - Will counsel on weight loss as alcohol is better controlled.    #Elevated Total Protein  Will rule out other infiltrative causes such as MM.   - SPEP and UPEP ordered    RTC in 2 months    Patient was seen and discussed with Dr. JayGardiner FantiD  Internal Medicine  PGY-2

## 2018-07-21 NOTE — Progress Notes (Signed)
HEPATOLOGY ATTENDING PROGRESS NOTE ATTESTATION  Subjective  Please see the excellent note by the fellow for full details about the patient's clinic visit today.   I personally spoke with and examined the patient on 07/21/2018, and I reviewed the fellow's note (dated 07/21/2018).    In summary, this is a patient who comes to see [place] today for evaluation of possible fatty liver.    PMHx:   1) PTSD - has been on multiple medications in the past   2) EtOH abuse   3) SVT - had ablation in 2016   4) Cholecystectomy - 2016    HPI:   Patient was noted to have elevated liver enzymes since 2011. Since he retired, he has been drinking 2 bottles of wine a day, as part of possible treatment of his PTSD. He denies any features of acute hepatitis or decompensated liver disease. He denies any RF for VH.    He eats 1 meal a day, and tries to avoid high fat foods. He does snack on junk food, and he does not exercise (usually due to his psychiatric issues).   He has chronic abdo pain and intermittent diarrhea, although he does take Mg supplements to help him with constipation.   His brother and sister have fatty liver, and his mother had a SCD.       Objective  I have examined the patient and concur with the fellow exam.    Assessment and Plan  I have thoroughly reviewed the chart, communications from the emergency room physician, consulting physician notes, and have reviewed labs, imaging (both reviewing the report as well as personally viewing and interpreting the images), and the other consult notes (both inpatient and outpatient).    Assessment and plan reviewed with the fellow. I agree with the fellows care plan.     1. Fatty Liver - Likely due to alcohol as opposed to NAFLD   -Concern for possible cirrhosis given his thrombocytopenia   -A) Check for other causes of liver disease   B) MRI abdomen with MR elastography to determine liver stiffness    2. Hepatomegaly - Elevated total protein   A) Check SPEP, UPEP   B) Possible liver  Bx    3. EtOH abuse - Intertwined with his PTSD   -Patient does not want to try other medications, happy following along with psychiatrist at [place]) Have advised patient to slowly decrease intake, so as to decrease risk of withdrawal sz    4.PTSD/Depression: Patient does not want to try medication   A) Have encouraged patient to try cognitive behavioural therapy, if he does not want to start medication    5. Diarrhea - ?Due to bile salt colopathy post-cholecystectomy vs osmotic diarrhea from alcohol vs IBS (since he has alternating constipation and diarrhea)   A) Have encouraged patient to increase fibre intake   B) Patient can try cholestyramine to bind bile salts, but this can bind other medications     6.RF for NAFLD:   A) Patient has been advised about limiting fructose intake and increasing exercise   B) Will check lipid panel and HbA1C    Amalia Greenhouse, MD, [facility]

## 2018-07-22 ENCOUNTER — Encounter: Payer: Self-pay | Admitting: Hospital

## 2018-07-22 LAB — EPG, SERUM
A/G Ratio, EPG: 0.92
Albumin, EPG: 3.92 gm/dL (ref 3.20–5.00)
Alpha 1, EPG: 0.36 gm/dL (ref 0.10–0.40)
Alpha 2, EPG: 0.82 gm/dL (ref 0.60–1.10)
Beta, EPG: 1.6 gm/dL — ABNORMAL HIGH (ref 0.60–1.30)
Gamma, EPG: 1.5 gm/dL (ref 0.70–1.50)
Total Protein, EPG: 8.2 gm/dL — ABNORMAL HIGH (ref 6.00–8.00)

## 2018-07-22 LAB — CERULOPLASMIN: Ceruloplasmin: 29 mg/dL (ref 15–30)

## 2018-07-22 LAB — AMA (ANTI-MITOCHONDRIAL AB), BLOOD: AMA (Anti-Mitochondria Ab): NEGATIVE

## 2018-07-23 LAB — EPG, INTERPRETATION, SERUM

## 2018-07-23 LAB — TISSUE TRANSGLUTAMINASE (TTG) ANGIBODY, IGA W/REFLEX TO ENDOMYSIAL AB: TTG IgA Ab: 0 U/mL (ref 0–3)

## 2018-07-24 LAB — ALPHA-1-ANTITRYPSIN PHENO PANEL, BLOOD: Alpha-1-Antitrypsin: 182 mg/dL (ref 90–200)

## 2018-08-04 ENCOUNTER — Other Ambulatory Visit: Payer: Commercial Managed Care - HMO

## 2018-08-04 DIAGNOSIS — R7401 Elevation of levels of liver transaminase levels: Secondary | ICD-10-CM

## 2018-08-04 DIAGNOSIS — R160 Hepatomegaly, not elsewhere classified: Secondary | ICD-10-CM

## 2018-08-04 NOTE — Interdisciplinary (Signed)
Drop off 24 Hrs. urine

## 2018-08-05 LAB — 24 HOUR URINE PROTEIN ELECTROPHORESIS
Albumin, Urine: INVALID mg/dL — IN
Alpha 1, Urine: INVALID mg/dL — IN
Alpha 2, Urine: INVALID mg/dL — IN
Beta, Urine: INVALID mg/dL — IN
Duration: 24 hours (ref 0–48)
Gamma, Urine: INVALID mg/dL — IN
Total Protein, UPEP: INVALID mg/dL — IN
Volume: 2550 mL — ABNORMAL HIGH (ref 600–1600)

## 2018-08-10 ENCOUNTER — Ambulatory Visit (HOSPITAL_BASED_OUTPATIENT_CLINIC_OR_DEPARTMENT_OTHER)
Admit: 2018-08-10 | Discharge: 2018-08-10 | Disposition: A | Discharge status: Home Health | Payer: Commercial Managed Care - HMO | Attending: Transplant Hepatology | Admitting: Transplant Hepatology

## 2018-08-10 ENCOUNTER — Ambulatory Visit
Admission: RE | Admit: 2018-08-10 | Discharge: 2018-08-10 | Disposition: A | Discharge status: Home / Self Care | Payer: Commercial Managed Care - HMO | Attending: Body Imaging | Admitting: Body Imaging

## 2018-08-10 DIAGNOSIS — R7401 Elevation of levels of liver transaminase levels: Secondary | ICD-10-CM

## 2018-08-10 DIAGNOSIS — D696 Thrombocytopenia, unspecified: Secondary | ICD-10-CM | POA: Insufficient documentation

## 2018-08-10 DIAGNOSIS — R740 Nonspecific elevation of levels of transaminase and lactic acid dehydrogenase [LDH]: Secondary | ICD-10-CM | POA: Insufficient documentation

## 2018-08-10 DIAGNOSIS — R160 Hepatomegaly, not elsewhere classified: Secondary | ICD-10-CM

## 2018-08-10 DIAGNOSIS — K760 Fatty (change of) liver, not elsewhere classified: Principal | ICD-10-CM | POA: Insufficient documentation

## 2018-08-10 DIAGNOSIS — R162 Hepatomegaly with splenomegaly, not elsewhere classified: Secondary | ICD-10-CM | POA: Insufficient documentation

## 2018-08-10 MED ORDER — GADOBUTROL 1 MMOL/ML IV SOLN
9.0000 mL | Freq: Once | INTRAVENOUS | Status: AC
Start: 2018-08-10 — End: 2018-08-10
  Administered 2018-08-10: 9 mL via INTRAVENOUS

## 2018-08-25 ENCOUNTER — Telehealth (HOSPITAL_BASED_OUTPATIENT_CLINIC_OR_DEPARTMENT_OTHER): Payer: Self-pay | Admitting: Transplant Hepatology

## 2018-08-25 NOTE — Telephone Encounter (Signed)
Spoke w pt and relayed message per Dr Vida Roller:    - 08/10/2018 MRI reviewed; was concerning for possible cirrhosis.  -  we would like to see him sooner.    Move up his 10/30 f/u appt w Steffanie Dunn NP to 10/16 1030. Assisted w scheduling by [facility] [place].

## 2018-08-26 ENCOUNTER — Telehealth (HOSPITAL_BASED_OUTPATIENT_CLINIC_OR_DEPARTMENT_OTHER): Payer: Self-pay | Admitting: Transplant Hepatology

## 2018-08-26 NOTE — Telephone Encounter (Signed)
Opened in error

## 2018-08-28 ENCOUNTER — Encounter (INDEPENDENT_AMBULATORY_CARE_PROVIDER_SITE_OTHER): Payer: Self-pay | Admitting: Transplant Hepatology

## 2018-09-15 ENCOUNTER — Ambulatory Visit (INDEPENDENT_AMBULATORY_CARE_PROVIDER_SITE_OTHER): Payer: Commercial Managed Care - HMO | Admitting: Nurse Practitioner

## 2018-09-15 ENCOUNTER — Encounter (INDEPENDENT_AMBULATORY_CARE_PROVIDER_SITE_OTHER): Payer: Self-pay | Admitting: Nurse Practitioner

## 2018-09-15 VITALS — BP 129/90 | HR 108 | Temp 98.3°F | Ht 72.0 in | Wt 266.0 lb

## 2018-09-15 DIAGNOSIS — K709 Alcoholic liver disease, unspecified: Secondary | ICD-10-CM

## 2018-09-15 DIAGNOSIS — F4310 Post-traumatic stress disorder, unspecified: Secondary | ICD-10-CM

## 2018-09-15 DIAGNOSIS — F1010 Alcohol abuse, uncomplicated: Secondary | ICD-10-CM

## 2018-09-15 DIAGNOSIS — K7030 Alcoholic cirrhosis of liver without ascites: Principal | ICD-10-CM

## 2018-09-15 DIAGNOSIS — F419 Anxiety disorder, unspecified: Secondary | ICD-10-CM

## 2018-09-15 NOTE — Patient Instructions (Signed)
-   Please complete labs every 3 months, next prior to your follow-up apt.   - Please call [phone] to schedule your endoscopy with Dr. Vida Roller.   - Work with your [facility] team to get referral to therapist, or if feeling unable to attend in person, then consider online modality. Consider regular exercise as you are able and starting a meditation program on your home. You could consider starting with the app "Headspace".   - It is not safe to continue to consume alcohol, we encourage tapering off alcohol until able to stop.   - We will reeat your liver imaging every 6 months.   - Do not take any NSAIDs for pain, ok to take up to 2 grams of tylenol in 24 hours if you need something for mild pain.   - Eat a diet low in carbs, avoid sugar and sugary beverages including fruit juice. And try to limit sodium intake to no more then 2,000mg  daily.   - Follow up in 3-4 months.     Thanks,   Richard Holz     Thank you for allowing me to participate in your care today.    As a reminder, please do not go to the lab without confirming you have lab orders in the computer. You may call the lab at [phone] to confirm.     If you have orders for imaging and need to schedule, please call [phone].    Please do not hesitate to call your Care Team at [phone] if you have any questions or concerns.        Amalia Greenhouse MD  Assistant Professor of Medicine  Hepatologist    Nita Sickle, MSN, FNP-BC  Hepatology Nurse Practitioner    Truett Perna, RN: refill medications or medical questions  [place]: [phone]    General questions regarding appointments, procedures or insurance issues  [phone]      Research Questions: please contact our [place] at [phone]

## 2018-09-15 NOTE — Progress Notes (Signed)
[facility] HEPATOLOGY CLINIC  NURSE PRACTITIONER: Nita Sickle, MSN, FNP-BC  HEPATOLOGIST: Amalia Greenhouse, MD    Date / Time: 09/15/2018 11:29 AM    Referring provider: No ref. provider found     Please send a copy of the consult to the referring physician    Reason for visit: Follow-up Elevated liver enzymes    Type of visit: Follow up, although new to me      Past Medical History:      Past Medical History:   Diagnosis Date   . Alcohol use    . Gastroesophageal reflux disease    . Hypertension    . Migraine headache     part of gulf war syndrome   . PTSD (post-traumatic stress disorder) 1995-present    treated by Dr. Joen Laura at [facility]    . SVT (supraventricular tachycardia) (CMS-HCC) 2011       Past Surgical History:  Past Surgical History:   Procedure Laterality Date   . GALLBLADDER SURGERY  2011       Current Medications:  albuterol 108 (90 BASE) MCG/ACT inhaler, Inhale 2 puffs by mouth every 6 hours as needed for Wheezing.  ALPRAZolam (XANAX) 1 MG tablet, Take 1 tablet (1 mg) by mouth 3 times daily as needed for Anxiety.  Esomeprazole Magnesium (NEXIUM PO), daily.  fluticasone propionate (FLONASE) 50 MCG/ACT nasal spray, Spray 2 sprays into each nostril daily.  hydrochlorothiazide (HYDRODIURIL) 12.5 MG tablet, Take 1 tablet (12.5 mg) by mouth daily.  PEG 3350 with electrolytes (COLYTE) 240 g solution, 4035ms by mouth per written instruction provided by GI clinic for split dose bowel preparation  zolpidem (AMBIEN) 10 MG tablet, Take 10 mg by mouth nightly as needed for Insomnia.        Allergy:  Allergies   Allergen Reactions   . Compazine Anxiety   . Latex Rash           History of present illness:   Brett Palmer a 52year old male with hx of elevated liver enzymes, PTSD with 9 years of heavy alcohol abuse, obesity (BMI 36) , SVT s/p ablation 2016 and cholecystectomy who was intiially referred to hepatology d/t elevated liver enzymes and fatty liver changes on imaging. He was last seen on 07/21/2018 with  Dr. JVida Rollerfor initial consultation and since then completed baseline lab work and MRI/MRE imaging.     He has hx of elevated liver enzymes since at least 2011 when he completed a cholecystectomy for gallbladder stones. He has been drinking about 2 bottles of wine daily since 2011 with recent reduction to 1.5 bottles daily over past 6 weeks. He feels unable to reduce alcohol further given his PTSD and feels alcohol has been only thing that has helped reduce his anxiety. [facility] and reports has tried "everything" and has poor reaction to medications. He also reports being involved in past for PTSD [facility] [place] [facility] [place] [facility] [place] [place] [facility]. He is not seeing therapist, but [place] [facility]     He has been working on [employer] eating mainly home cooked meals and has been able to lose 10# since last visit. He does not exercise, reports he has breathing issues that prevent him from exercising much and also has extreme anxiety when he leaves the home which prevents him from exercising.     He denies any hx  of acute hepatitis, and no symptoms or features of decompensated liver disease. No hx of pedal edema or ascites, no jaundice, hx of GIB.     Initial liver related labs completed on 07/21/2018 are not concerning for other cause liver disaese including viral hepatitis, A1AT deficiency, AIH, Iron overload, Wilson's Disease, and PBC. He is not immune to Hep A or B, but reports hx of vaccination.     He completed MRI/MRE on surface nodularity noted, moderate hepatic steatosis wihout iron overload. MRE elevated at 11 kPa suggestive of cirrhosis.     Family history: No family history of liver disease or cancer  Family History   Problem Relation Name Age of Onset   . Cancer Father          melanoma, died at age 72   . Heart Disease Mother          CHF, thought to have passed from MI   . Heart Disease  Maternal Aunt          CHF       Social history:  Social History     Socioeconomic History   . Marital status: Married     Spouse name: [name]   . Number of children: 2   . Years of education: Not on file   . Highest education level: Not on file   Occupational History   . 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 Smoking status: Former Smoker     Packs/day: 0.50     Years: 10.00     Pack years: 5.00     Types: Cigarettes     Last attempt to quit: 01/11/1990     Years since quitting: 28.6   . Smokeless tobacco: Never Used   Substance and Sexual Activity   . Alcohol use: Yes     Comment: down to 1/2-3/4 bottle of wine   . Drug use: No     Comment: Denies MJ use   . Sexual activity: Yes     Partners: Female   Social Activities of Daily Living Present   . Not on file   Social History Narrative   . Not on file     Current Living Situation: Lives with wife [name], has two kids   Employment: English as a second language teacher, Surveyor, mining   Support Person: Wife   Physical Activity: No regular exercise   Diet: LSD     Substance Abuse History:  Substance Abuse History:Denies any use of injection drugs/snorting cocaine.    Alcohol Use History:  He drinks on average 1.5 bottles of wine daily, throughout day.   Was drinking 2 bottles daily- recently reduced to 1.5   -Patient is currently Drinking Alcohol   Audit C    Question #1: How often did you have a drink containing alcohol in the past year?   . Never (0 points)    . Monthly or less (1 point)   . Two to four times a month (2 points)   . Two to three times per week (3 points)   . Four or more times a week (4 points)   Question #2: How many drinks did you have on a typical day when you were drinking in the past year?   . 1 or 2 (0 points)   . 3 or 4 (1 point)   . 5 or 6 (2 points)   . 7 to 9 (3 points)   .  10 or more (4 points)   Question #3: How often did you have six (four for women) or more drinks on one occasion in the past year?   . Never (0 points)   .  Less than monthly (1 point)   . Monthly (2 points)   . Weekly (3 points)   . Daily or almost daily (4 points)     Total Score: 12    (In men, a score of 4 or more is considered positive; in women, a score of 3 or more is considered positive)    Risk factors for liver disease:   - Viral: Known HBV and HCV negative  - History of heavy alcohol use: Yes  - Metabolic:      -- elevated BMI:Yes, Body mass index is 36.08 kg/m.     -- HLD: Yes     -- DMII: No     -- family history of DMII: No     -- family history of fatty liver: No  - DILI risks: relevant herbal, supplement, OTC medications: No  - Autoimmune disease: No  - Inflammatory bowel disease: No  - History of familial liver disease: No     Dietary supplement use: yes, gets supplements from [place], taking red beat extract and asking about milk thistle       Review of Systems:  Weight loss:yes  Trouble breathing: yes  Abdominal pain: no  Bowel movement frequency: regular   Nausea / vomitting: no  Thyroid problems: no  Skin rash: no  Ascites:no  Confusion: no  Other: anxiety   Rest of the review of system was negative    No ascites, edema, no hematemesis, hematochezia, melena, no jaundice, pruritis or acholic stools, no hepatic encephalopathy (no overt encephalopathy, no reversal of sleep/wake cycles, no memory/attention issues).     Physical examination:   BP 129/90 (BP Location: Left arm, BP Patient Position: Sitting, BP cuff size: Large)   Pulse 108   Temp 98.3 F (36.8 C) (Oral)   Ht 6' (1.829 m)   Wt 120.7 kg (266 lb)   BMI 36.08 kg/m    Wt Readings from Last 2 Encounters:   09/15/18 120.7 kg (266 lb)   07/21/18 121.5 kg (267 lb 14.4 oz)      Blood Pressure   09/15/18 129/90   07/21/18 152/72      Pain Score: Pain Score: 0  General:  Alert and oriented x 3  Affect:  Normal  HEENT:  No scleral icterus, no conjunctival pallor, mucus membranes moist, neck supple without lymphadenopathy, no thryroid abnormalities appreciated  Lungs:  Clear to auscultation  bilaterally  Cardiovascular:  No carotid bruit, regular rate and rhythm without murmurs, rubs or gallops.  Abdomen:  Bowel sounds present, liver nonpalable, spleen nonpalpable, no ascites, no tenderness and no guarding  Extremities:  No peripheral edema  Skin:  Non-icteric, no palmar erythema, no spider angiomas, and no visible rash  Neuro:  No asterixis    Studies:  Labs: Following labs reviewed  Lab Results   Component Value Date    AST 139 (H) 07/21/2018    ALT 71 (H) 07/21/2018    GGT 452 (H) 07/21/2018    ALK 101 07/21/2018    TP 8.2 (H) 07/21/2018    ALB 4.5 07/21/2018    TBILI 1.06 07/21/2018    DBILI 0.2 02/24/2017      Lab Results   Component Value Date    WBC 7.1 07/21/2018    RBC  5.03 07/21/2018    HGB 15.8 07/21/2018    HCT 48.6 07/21/2018    MCV 96.6 (H) 07/21/2018    MCHC 32.5 07/21/2018    RDW 13.9 07/21/2018    PLT 129 (L) 07/21/2018    MPV 9.9 07/21/2018      Lab Results   Component Value Date    NA 137 07/21/2018    K 5.1 07/21/2018    CL 96 (L) 07/21/2018    BICARB 25 07/21/2018    BUN 5 (L) 07/21/2018    CREAT 0.89 07/21/2018    GLU 99 07/21/2018    [place] 9.5 07/21/2018      Lab Results   Component Value Date    BUN 5 (L) 07/21/2018    CREAT 0.89 07/21/2018    CL 96 (L) 07/21/2018    NA 137 07/21/2018    K 5.1 07/21/2018    [place] 9.5 07/21/2018    TBILI 1.06 07/21/2018    ALB 4.5 07/21/2018    TP 8.2 (H) 07/21/2018    AST 139 (H) 07/21/2018    ALK 101 07/21/2018    BICARB 25 07/21/2018    ALT 71 (H) 07/21/2018    GLU 99 07/21/2018        Viral serologies/labs:  Lab Results   Component Value Date    HEPATITISCAB Non Reactive 07/21/2018      No results found for: HEPBSURFACAB  No results found for: HEPCRNA  No results found for: HEPBDNAQT     Imaging:  MRI/MRE 08/10/2018  LIVER:  Morphology: No surface nodularity. Enlarged with slight posterior right notching.  Hepatic arterial anatomy: Standard  Portal and hepatic venous patency: Portal and hepatic veins patent  Main portal vein diameter: 14 mm  Varices:  None seen  Ascites: None seen  Other: PDFF 10%, moderate hepatic steatosis. R2* 34 s^-1, no iron overload. There is suggestion of possible mild early volumetric redistribution with enlargement of the caudate and left lobe of the liver.    Preliminary created by: Jean Rosenthal   Signed by: Stefano Gaul 08/11/2018 11:57:11  IMPRESSION:  IMPRESSION:  Estimated stiffness:  mean 11.1 kPa    Comment: MRE-estimated stiffness of the liver is a marker of liver fibrosis. Based on meta-analysis data, stiffness > 2.8 kPa suggests some degree of fibrosis (F1 or greater), stiffness > 3.6 kPa suggests advanced fibrosis (F3 or greater), and a longitudinal change of  > 20% is considered to represent true change. Note that stiffness values may vary depending on etiology of liver disease, field strength, operator experience, and other factors.    Procedures:    Clinical Calculators:   MELD-Na score: 9 at 07/21/2018  3:00 PM  MELD score: 9 at 07/21/2018  3:00 PM  Calculated from:  Serum Creatinine: 0.89 mg/dL (Rounded to 1 mg/dL) at 07/21/2018  3:00 PM  Serum Sodium: 137 mmol/L at 07/21/2018  3:00 PM  Total Bilirubin: 1.06 mg/dL at 07/21/2018  3:00 PM  INR(ratio): 1.2 at 07/21/2018  3:00 PM  Age: 14 years    MELD-Na:9  CTP score: A5  FIB-4: 6.5  (<0.8= 85% of chances of having F0-F1)  (<1.45=95% of chances of having F0-F2)  (>3.25= 76.4 % chances of having >F2)  APRI: 2.694  (>1= had a sensitivity of 76% and specificity of 72% for predicting cirrhosis)    IMPRESSIONS: Brett Palmer is a 52 year old male with hx of heavy alcohol abuse, obesity, PTSD, SVT s/p ablation, with new diagnosis of liver cirrhosis  likely r/t EtOH use. Cirrhosis based on MRE of 11 kPa and non-invasive serologic assessment (FIB4/APRI), he is currently asymptomatic without hx of acute hepatitis or decompensation. He continues to drink and feels anxiety/PTSD is a barrier to stopping alcohol.     Plan:I have examined the patient and reviewed all available pertinent  labs, imaging, procedures and documents in EPIC and media.     EtOH Cirrhosis     NA-MELD9; Child's A5  - cirrhosis based on biochemical assessment and MRE   - No h/o jaundice, confusion, ascites, LLE or s/sx's UGIB   - counseled regarding natural history of cirrhosis and risk for HCC and liver decompensation  - Transplant candidate: Not a candidate d/t low biologic MELD and continued alcohol use   - No e/o or histroy of HE/Fluid volume overload, will continue to monitor  - Diet: Encouraged LSD    Alcohol abuse  - Encouraged cessation of alcohol, he is resistant to this as he is self medication PTSD with alcohol  - Discussed progression of cirrhosis/risk of decompensation with ongoing use    PTSD/Anxiety  - Discussed ongoing care with psychiatry, he is not wanting to try medication- report poor SE with all attempts of medication  - Encouraged establishing with therapist  - Discussed behaviors to improve anxiety- exercise, meditation practice, sleep hygiene, etc    At risk for cancer 2/2 Cirrhosis: [place] Surveillance recommended.   - reviewed surveillance: imaging UTD, negative, AFP will order with next labs ;  repeat every 6 months as recommended by AASLD guidelines     Portal Hypertension  - splenomegaly (16cm) noted on MRI  - Esophageal varices/Gastric varices surveillance- EGD ordered     Esophageal varices/Gastric varices surveillance:    - Ordered baseline EGD   - discussed natural history of development of varices and risk for bleeding and potential life threatening complications    HCM  -advised patient against taking NSAIDS/aspirin, take acetaminophen for pain, no more then '500mg'$  every 6 hours or '2000mg'$  in 24 hours.  -recommend 1.5gm/kg protein daily, may supplement with protein shakes, no more then 2gm sodium daily, avoid alcohol  -immunizations: up to date/recommend routine immunizations for age from PCP      RTC: 3 months     30 minutes spent in coordinating care for this patient, > 50% spent in  face-to-face counseling for the above issues. All questions answered and explained to patient's satisfaction.     My above recommendations will be communicated with the referring physician by way of letter or the electronic medical record.    Please don't hesitate to contact me should you have any questions regarding the care of this patient.        Nita Sickle, NP   Hepatology Nurse Practitioner

## 2018-09-29 ENCOUNTER — Encounter (INDEPENDENT_AMBULATORY_CARE_PROVIDER_SITE_OTHER): Payer: Commercial Managed Care - HMO | Admitting: Nurse Practitioner

## 2018-10-01 ENCOUNTER — Encounter (INDEPENDENT_AMBULATORY_CARE_PROVIDER_SITE_OTHER): Payer: Self-pay | Admitting: Family Medicine

## 2018-10-01 NOTE — Telephone Encounter (Signed)
From: Jackalyn Lombard  To: [name], MD  Sent: 10/01/2018 12:40 PM PDT  Subject: [name] Morning Dr. Salvadore Dom,    I would like to request a letter from you to allow my wife to go on [facility] to be with me.    For a while now, I have had trouble sleeping and have had increasingly bad nightmares, which have made it difficult for me to function throughout the day and increased my anxiety. When my wife is here with me, she eases my nightmares and makes me more comfortable and able to sleep. If I could get a letter from you to allow her to take 4 weeks off of work to help me, that would be great. My wife needs a certification from my medical provider in order to take [facility].    She would also be able to help me physically by taking me to my [facility] counseling. Attached is a letter from the [place] stating that I am 100% disabled.     If you could send a letter requesting a 4 week period for [facility] effective 10/01/2018, I would be most grateful.    Regards,    Brett Palmer

## 2018-10-05 ENCOUNTER — Encounter (INDEPENDENT_AMBULATORY_CARE_PROVIDER_SITE_OTHER): Payer: Self-pay | Admitting: Family Medicine

## 2018-10-05 ENCOUNTER — Telehealth (INDEPENDENT_AMBULATORY_CARE_PROVIDER_SITE_OTHER): Payer: Self-pay | Admitting: Family Medicine

## 2018-10-05 NOTE — Telephone Encounter (Signed)
Triage/Float RN Note: FMLA Request       Situation: Pt requesting a MD letter/work excuse not/FMLA for pt's wife. Pt reports having issues with PTSD and trouble sleeping and has been having episodes of dizziness that  He reports increased of fall. Pt would like a letter starting from 10/01/18 and extended for a month.     Reference on 10/01/18 mychart message and RN/writer spoke with pt.       Provider routed for further assessment and advisement      Jannifer Franklin, MSN, RN   [facility]  Float/ Triage Nurse,   Email: [email or web address]

## 2018-10-05 NOTE — Telephone Encounter (Signed)
Who is calling: Incoming call from patient  Insurance Coverage Verified: Active- in network  Reason for this call: Patient is calling to follow up on the below request. Routing as high priority per caller's request. Please assist.     Action required by office: Please expedite request and Please contact caller    Duplicate encounter? Yes, please refer to MyChart encounter dated 11/01     Best way to contact: [phone]  Alternative: [phone]    Inquiry has been read verbatim to this caller. Verbalizes satisfaction and confirms the above is accurate: yes       Has been advised this message will be transmitted to office and can expect a response within the next 24-72 hours.

## 2018-10-05 NOTE — Telephone Encounter (Signed)
Letter written. Please notify Pt. Would also rec he schedule a f/u appt with me in the next couple months to review the latest updates with his health

## 2018-10-05 NOTE — Telephone Encounter (Signed)
Triage/Float RN Note: Letter    RN left VM with instruction to review mychart regarding FMLA letter. LVM. Instructions left to contact clinic for any questions or concerns.     Jannifer Franklin, MSN, RN   [facility]  Float/ Triage Nurse,   Email: [email or web address]

## 2018-10-05 NOTE — Telephone Encounter (Signed)
Addressed in other encounter 

## 2018-10-06 NOTE — Telephone Encounter (Signed)
From: Jackalyn Lombard  To: [name], MD  Sent: 10/05/2018 5:36 PM PST  Subject: 20-Other    You are the greet, Thank you so much.    Best Regards  MarkLa'Haye

## 2018-10-07 ENCOUNTER — Telehealth (HOSPITAL_BASED_OUTPATIENT_CLINIC_OR_DEPARTMENT_OTHER): Payer: Self-pay | Admitting: Nurse Practitioner

## 2018-10-07 NOTE — Telephone Encounter (Signed)
Called and left detailed message for pt to call and schedule EGD. Provided best call back number for pt to call and schedule.

## 2018-10-10 ENCOUNTER — Observation Stay
Admission: EM | Admit: 2018-10-10 | Discharge: 2018-10-11 | Disposition: A | Discharge status: Home / Self Care | Payer: Commercial Managed Care - HMO | Attending: Surgery | Admitting: Surgery

## 2018-10-10 DIAGNOSIS — I471 Supraventricular tachycardia: Secondary | ICD-10-CM | POA: Insufficient documentation

## 2018-10-10 DIAGNOSIS — R1084 Generalized abdominal pain: Secondary | ICD-10-CM

## 2018-10-10 DIAGNOSIS — Z9049 Acquired absence of other specified parts of digestive tract: Secondary | ICD-10-CM | POA: Insufficient documentation

## 2018-10-10 DIAGNOSIS — Z86010 Personal history of colonic polyps: Secondary | ICD-10-CM | POA: Insufficient documentation

## 2018-10-10 DIAGNOSIS — I10 Essential (primary) hypertension: Secondary | ICD-10-CM | POA: Insufficient documentation

## 2018-10-10 DIAGNOSIS — K625 Hemorrhage of anus and rectum: Secondary | ICD-10-CM | POA: Insufficient documentation

## 2018-10-10 DIAGNOSIS — K760 Fatty (change of) liver, not elsewhere classified: Secondary | ICD-10-CM | POA: Insufficient documentation

## 2018-10-10 DIAGNOSIS — Z888 Allergy status to other drugs, medicaments and biological substances status: Secondary | ICD-10-CM | POA: Insufficient documentation

## 2018-10-10 DIAGNOSIS — F4310 Post-traumatic stress disorder, unspecified: Secondary | ICD-10-CM | POA: Insufficient documentation

## 2018-10-10 DIAGNOSIS — R197 Diarrhea, unspecified: Secondary | ICD-10-CM

## 2018-10-10 DIAGNOSIS — R7989 Other specified abnormal findings of blood chemistry: Secondary | ICD-10-CM | POA: Insufficient documentation

## 2018-10-10 DIAGNOSIS — Z87891 Personal history of nicotine dependence: Secondary | ICD-10-CM | POA: Insufficient documentation

## 2018-10-10 DIAGNOSIS — Z79899 Other long term (current) drug therapy: Secondary | ICD-10-CM | POA: Insufficient documentation

## 2018-10-10 DIAGNOSIS — E669 Obesity, unspecified: Secondary | ICD-10-CM | POA: Insufficient documentation

## 2018-10-10 DIAGNOSIS — K3580 Unspecified acute appendicitis: Principal | ICD-10-CM | POA: Insufficient documentation

## 2018-10-10 DIAGNOSIS — Z8249 Family history of ischemic heart disease and other diseases of the circulatory system: Secondary | ICD-10-CM | POA: Insufficient documentation

## 2018-10-10 DIAGNOSIS — K219 Gastro-esophageal reflux disease without esophagitis: Secondary | ICD-10-CM | POA: Insufficient documentation

## 2018-10-10 DIAGNOSIS — R110 Nausea: Secondary | ICD-10-CM

## 2018-10-10 DIAGNOSIS — Z6836 Body mass index (BMI) 36.0-36.9, adult: Secondary | ICD-10-CM | POA: Insufficient documentation

## 2018-10-10 DIAGNOSIS — F1010 Alcohol abuse, uncomplicated: Secondary | ICD-10-CM | POA: Insufficient documentation

## 2018-10-10 DIAGNOSIS — K5900 Constipation, unspecified: Secondary | ICD-10-CM | POA: Insufficient documentation

## 2018-10-10 LAB — CBC WITH DIFF, BLOOD
ANC-Automated: 5.4 10*3/uL (ref 1.6–7.0)
Abs Basophils: 0 10*3/uL (ref <0.1)
Abs Eosinophils: 0.1 10*3/uL (ref 0.1–0.5)
Abs Lymphs: 1.4 10*3/uL (ref 0.8–3.1)
Abs Monos: 0.4 10*3/uL (ref 0.2–0.8)
Basophils: 0 %
Eosinophils: 2 %
Hct: 44.8 % (ref 40.0–50.0)
Hgb: 15.2 gm/dL (ref 13.7–17.5)
Lymphocytes: 19 %
MCH: 31.1 pg (ref 26.0–32.0)
MCHC: 33.9 g/dL (ref 32.0–36.0)
MCV: 91.6 um3 (ref 79.0–95.0)
MPV: 9.6 fL (ref 9.4–12.4)
Monocytes: 5 %
Plt Count: 106 10*3/uL — ABNORMAL LOW (ref 140–370)
RBC: 4.89 10*6/uL (ref 4.60–6.10)
RDW: 14.1 % — ABNORMAL HIGH (ref 12.0–14.0)
Segs: 74 %
WBC: 7.3 10*3/uL (ref 4.0–10.0)

## 2018-10-10 LAB — APTT, BLOOD: PTT: 36 s — ABNORMAL HIGH (ref 25–34)

## 2018-10-10 LAB — PROTHROMBIN TIME, BLOOD
INR: 1.1
PT,Patient: 12.6 s — ABNORMAL HIGH (ref 9.7–12.5)

## 2018-10-10 MED ORDER — MORPHINE SULFATE 4 MG/ML IJ SOLN
4.0000 mg | Freq: Once | INTRAMUSCULAR | Status: AC
Start: 2018-10-10 — End: 2018-10-10
  Administered 2018-10-10: 4 mg via INTRAVENOUS
  Filled 2018-10-10: qty 1

## 2018-10-10 MED ORDER — ONDANSETRON HCL 4 MG/2ML IV SOLN
4.0000 mg | Freq: Once | INTRAMUSCULAR | Status: AC
Start: 2018-10-10 — End: 2018-10-10
  Administered 2018-10-10: 4 mg via INTRAVENOUS
  Filled 2018-10-10: qty 2

## 2018-10-10 MED ORDER — FAMOTIDINE IN NACL 20 MG/50ML IV SOLN
20.0000 mg | Freq: Once | INTRAVENOUS | Status: AC
Start: 2018-10-10 — End: 2018-10-10
  Administered 2018-10-10: 20 mg via INTRAVENOUS
  Filled 2018-10-10: qty 50

## 2018-10-10 NOTE — ED Provider Notes (Addendum)
History  Chief Complaint   Patient presents with   . Abdominal Pain     has been having upper abdominal pain with nausea but no vomiting.   . Rectal Bleeding     patient states bright red bleeding per rectally with diarrhea in the past couple days.     HPI this is a 52 year old male 1 month status post colonoscopy with removal of 3 polyps now comes in with acute onset of 3 days upper abdominal pain that radiates down to the lower abdomen associated with 4 episodes of diarrhea watery with some blood.  Denies any nausea or vomiting.  Denies any fevers or chills.  The pain is severe crampy and diffuse burning at times.  Denies any cough or shortness of breath no dizziness    Past Medical History:   Diagnosis Date   . Alcohol use    . Gastroesophageal reflux disease    . Hypertension    . Migraine headache     part of gulf war syndrome   . PTSD (post-traumatic stress disorder) 1995-present    treated by Dr. Joen Laura at [facility]    . SVT (supraventricular tachycardia) (CMS-HCC) 2011       Past Surgical History:   Procedure Laterality Date   . GALLBLADDER SURGERY  2011       Family History   Problem Relation Name Age of Onset   . Cancer Father          melanoma, died at age 22   . Heart Disease Mother          CHF, thought to have passed from MI   . Heart Disease Maternal Aunt          CHF       Social History     Tobacco Use   . Smoking status: Former Smoker     Packs/day: 0.50     Years: 10.00     Pack years: 5.00     Types: Cigarettes     Last attempt to quit: 01/11/1990     Years since quitting: 28.7   . Smokeless tobacco: Never Used   Substance Use Topics   . Alcohol use: Yes     Comment: down to 1/2-3/4 bottle of wine   . Drug use: No     Comment: Denies MJ use       Home Medication List  Prior to Admission Medications   Prescriptions Last Dose Informant Patient Reported? Taking?   ALPRAZolam (XANAX) 1 MG tablet Taking  No Yes   Sig: Take 1 tablet (1 mg) by mouth 3 times daily as needed for Anxiety.   Esomeprazole  Magnesium (NEXIUM PO) Not Taking  Yes No   Sig: daily.   albuterol 108 (90 BASE) MCG/ACT inhaler Taking  No Yes   Sig: Inhale 2 puffs by mouth every 6 hours as needed for Wheezing.   fluticasone propionate (FLONASE) 50 MCG/ACT nasal spray Not Taking  No No   Sig: Spray 2 sprays into each nostril daily.   hydrochlorothiazide (HYDRODIURIL) 12.5 MG tablet Not Taking  No No   Sig: Take 1 tablet (12.5 mg) by mouth daily.   zolpidem (AMBIEN) 10 MG tablet Taking  Yes Yes   Sig: Take 10 mg by mouth nightly as needed for Insomnia.      Facility-Administered Medications: None       Review of Systems  Constitutional: negative for: anorexia, fever.  Eyes: negative for:  red eyes, eye pain.  Ears, Nose, Mouth, Throat: negative for:  sore throat, rhinorrhea, ear pain.  CV: negative for:  palpitations, syncope, chest pain.  Resp: negative for:  cough, shortness of breath, pleuritic pain, dyspnea on exertion.  GI:  Positive for abdominal pain cramps and diarrhea  GU: negative for: dysuria, frequency, hesitancy.  Musculoskeletal: negative for: joint pain, muscle cramping, neck pain.  Integumentary: negative for: rash, itching, bruising.  Neuro: negative for: headaches, seizures, paralysis/weakness.      Physical Exam  BP 174/100   Pulse 95   Temp 98.3 F (36.8 C)   Resp 20   Ht 6' (1.829 m)   Wt 120.7 kg (266 lb)   SpO2 96%   BMI 36.08 kg/m     Physical Exam  Alert male in mild distress  Vital signs hypertensive  HEENT exam no scleral icterus extraocular motion intact pupils are light pharynx clear mucosa moist  Neck is supple without JVD or adenopathy no thyromegaly  Lungs clear good breath sounds  Coronary regular without murmurs or gallops  Abdomen mild distention, positive bowel sounds diffuse tenderness without rebound or guarding, positive Rosving's  Genitalia normal male nontender  Rectal no stool some mucus occult heme-positive  Back no CVA tenderness  Lower extremities without edema, positive pain with heel lb,  negative obturator and negative psoas  Skin no rash  POCT Results       Workup Review       Impression/Medical Decision Making:  This is a 52 year old male with several days of crampy diffuse abdominal pain with diarrhea with some blood.  This may be simple gastroenteritis.  Patient is worried about side small-bowel obstruction however he has had no nausea or vomiting.  He did have a colonoscopy in this could be a remote complication of that such is an incomplete perforation or continued bleeding from his polyps.  Plan will go ahead and check his baseline labs for abdomen check coags, morphine for pain and reassess.  Patient may require CT of the abdomen.           Azarria Balint, Lucien Mons, MD  10/10/18 [id number]       Dickie La, MD  10/11/18 [phone]

## 2018-10-10 NOTE — ED Notes (Signed)
Pt states mid upper abdominal pain that woke him up from sleep last night, worsening. States pain shoots straight down to mid abdomen. Nauseas, diarrhea. Denies fever, vomiting

## 2018-10-11 ENCOUNTER — Encounter (HOSPITAL_BASED_OUTPATIENT_CLINIC_OR_DEPARTMENT_OTHER): Payer: Self-pay

## 2018-10-11 ENCOUNTER — Emergency Department (HOSPITAL_BASED_OUTPATIENT_CLINIC_OR_DEPARTMENT_OTHER): Payer: Commercial Managed Care - HMO

## 2018-10-11 DIAGNOSIS — R109 Unspecified abdominal pain: Secondary | ICD-10-CM

## 2018-10-11 DIAGNOSIS — R160 Hepatomegaly, not elsewhere classified: Secondary | ICD-10-CM

## 2018-10-11 DIAGNOSIS — Z9889 Other specified postprocedural states: Secondary | ICD-10-CM

## 2018-10-11 DIAGNOSIS — K3580 Unspecified acute appendicitis: Secondary | ICD-10-CM

## 2018-10-11 LAB — COMPREHENSIVE METABOLIC PANEL, BLOOD
ALT (SGPT): 73 U/L — ABNORMAL HIGH (ref 0–41)
AST (SGOT): 102 U/L — ABNORMAL HIGH (ref 0–40)
Albumin: 4.8 g/dL (ref 3.5–5.2)
Alkaline Phos: 87 U/L (ref 40–129)
Anion Gap: 15 mmol/L (ref 7–15)
BUN: 6 mg/dL (ref 6–20)
Bicarbonate: 25 mmol/L (ref 22–29)
Bilirubin, Tot: 0.94 mg/dL (ref <1.2)
Calcium: 9.8 mg/dL (ref 8.5–10.6)
Chloride: 96 mmol/L — ABNORMAL LOW (ref 98–107)
Creatinine: 0.78 mg/dL (ref 0.67–1.17)
GFR: >60 mL/min
Glucose: 126 mg/dL — ABNORMAL HIGH (ref 70–99)
Potassium: 3.8 mmol/L (ref 3.5–5.1)
Sodium: 136 mmol/L (ref 136–145)
Total Protein: 7.8 g/dL (ref 6.0–8.0)

## 2018-10-11 LAB — ECG 12-LEAD
ATRIAL RATE: 94 {beats}/min
ECG INTERPRETATION: NORMAL
P AXIS: 3 degrees
PR INTERVAL: 164 ms
QRS INTERVAL/DURATION: 100 ms
QT: 378 ms
QTC INTERVAL: 472 ms
R AXIS: 80 degrees
T AXIS: 19 degrees
VENTRICULAR RATE: 94 {beats}/min

## 2018-10-11 LAB — LIPASE, BLOOD: Lipase: 25 U/L (ref 13–60)

## 2018-10-11 MED ORDER — MORPHINE SULFATE 4 MG/ML IJ SOLN
2.0000 mg | INTRAMUSCULAR | Status: DC | PRN
Start: 2018-10-11 — End: 2018-10-11

## 2018-10-11 MED ORDER — SODIUM CHLORIDE 0.9 % IV SOLN
12.5000 mg | Freq: Once | INTRAVENOUS | Status: AC
Start: 2018-10-11 — End: 2018-10-11
  Administered 2018-10-11: 12.5 mg via INTRAVENOUS
  Filled 2018-10-11: qty 0.5

## 2018-10-11 MED ORDER — IOHEXOL 350 MG/ML IV SOLN
100.0000 mL | Freq: Once | INTRAVENOUS | Status: AC
Start: 2018-10-11 — End: 2018-10-11
  Administered 2018-10-11: 100 mL via INTRAVENOUS
  Filled 2018-10-11: qty 100

## 2018-10-11 MED ORDER — HYDROMORPHONE HCL 1 MG/ML IJ SOLN
0.5000 mg | Freq: Once | INTRAMUSCULAR | Status: AC
Start: 2018-10-11 — End: 2018-10-11
  Administered 2018-10-11 (×2): 0.5 mg via INTRAVENOUS
  Filled 2018-10-11: qty 0.5

## 2018-10-11 MED ORDER — HYDROMORPHONE HCL 1 MG/ML IJ SOLN
0.5000 mg | Freq: Once | INTRAMUSCULAR | Status: AC
Start: 2018-10-11 — End: 2018-10-11
  Administered 2018-10-11: 0.5 mg via INTRAVENOUS
  Filled 2018-10-11: qty 0.5

## 2018-10-11 MED ORDER — ONDANSETRON HCL 4 MG/2ML IV SOLN
4.0000 mg | Freq: Once | INTRAMUSCULAR | Status: AC
Start: 2018-10-11 — End: 2018-10-11
  Administered 2018-10-11: 4 mg via INTRAVENOUS
  Filled 2018-10-11: qty 2

## 2018-10-11 MED ORDER — SODIUM CHLORIDE 0.9 % IV SOLN
INTRAVENOUS | Status: DC
Start: 2018-10-11 — End: 2018-10-11
  Administered 2018-10-11: 05:00:00 via INTRAVENOUS

## 2018-10-11 MED ORDER — MORPHINE SULFATE 4 MG/ML IJ SOLN
4.0000 mg | INTRAMUSCULAR | Status: DC | PRN
Start: 2018-10-11 — End: 2018-10-11
  Administered 2018-10-11 (×2): 4 mg via INTRAVENOUS
  Filled 2018-10-11 (×2): qty 1

## 2018-10-11 MED ORDER — DICYCLOMINE HCL 10 MG OR CAPS
10.0000 mg | ORAL_CAPSULE | Freq: Three times a day (TID) | ORAL | 0 refills | Status: DC
Start: 2018-10-11 — End: 2019-04-06

## 2018-10-11 MED ORDER — ALPRAZOLAM 0.5 MG OR TABS
0.5000 mg | ORAL_TABLET | Freq: Once | ORAL | Status: AC
Start: 2018-10-11 — End: 2018-10-11
  Administered 2018-10-11: 0.5 mg via ORAL
  Filled 2018-10-11: qty 1

## 2018-10-11 NOTE — Consults (Signed)
General Surgery Consult    CC:   Abdominal pain    History of present illness:  Brett Palmer is a 52 year old male with a h/o PTSD, GERD, EtOH abuse with elevated LFTs and fatty liver changes, obesity, SVT s/p ablation with baseline tachycardia (2016), cholecystectomy, who presented to the ED with abdominal pain for the past day.     Brett Palmer reports that he woke up with epigastric pain about 24 hours ago a/w nausea, loss of appetite, chills, and bilateral lower quadrant discomfort. He had one episode of bloody watery diarrhea prior to arriving to the ED. He reports intermittent bloating and lower abdomen discomfort with increasing frequency for the past year, a/w fluctuations between constipation and diarrhea, and difficulty urinating. He denies fever, emesis, dysuria, or weight loss.     His medical history is otherwise notable for colonoscopy in July 2019 (4 benign polyps resected), "pulmonary issues" with baseline 90-92% SpO2 with occasional albuterol use, a history of hypertension which he reports no longer needs medications for, and follows with hepatology for fatty liver. He currently drinks about 3 to 4 cups of wine per day, and takes 4mg  of xanax plus ambien 10mg  for night terrors.      Upon arrival to the ED, he was afebrile with BP of 174/100. CBC and lipase were unremarkable. General surgery was consulted due to concern for appendicitis on CT A/P.     PMH:  Past Medical History:   Diagnosis Date   . Alcohol use    . Gastroesophageal reflux disease    . Hypertension    . Migraine headache     part of gulf war syndrome   . PTSD (post-traumatic stress disorder) 1995-present    treated by Dr. Joen Laura at [facility]    . SVT (supraventricular tachycardia) (CMS-HCC) 2011       PSH:  Past Surgical History:   Procedure Laterality Date   . GALLBLADDER SURGERY  2011       SH:  Social History     Socioeconomic History   . Marital status: Married     Spouse name: [name]   . Number of children: 2   . Years of education:  Not on file   . Highest education level: Not on file   Occupational History   . 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 Smoking status: Former Smoker     Packs/day: 0.50     Years: 10.00     Pack years: 5.00     Types: Cigarettes     Last attempt to quit: 01/11/1990     Years since quitting: 28.7   . Smokeless tobacco: Never Used   Substance and Sexual Activity   . Alcohol use: Yes     Comment: down to 1/2-3/4 bottle of wine   . Drug use: No     Comment: Denies MJ use   . Sexual activity: Yes     Partners: Female   Social Activities of Daily Living Present   . Not on file   Social History Narrative   . Not on file       FH:  Family History   Problem Relation Name Age of Onset   . Cancer Father          melanoma, died at age 34   . Heart Disease Mother          CHF, thought to have  passed from MI   . Heart Disease Maternal Aunt          CHF       Medications:  No current facility-administered medications for this encounter.      Current Outpatient Medications   Medication Sig   . albuterol 108 (90 BASE) MCG/ACT inhaler Inhale 2 puffs by mouth every 6 hours as needed for Wheezing.   [name] ALPRAZolam (XANAX) 1 MG tablet Take 1 tablet (1 mg) by mouth 3 times daily as needed for Anxiety.   . Esomeprazole Magnesium (NEXIUM PO) daily.   . fluticasone propionate (FLONASE) 50 MCG/ACT nasal spray Spray 2 sprays into each nostril daily.   . hydrochlorothiazide (HYDRODIURIL) 12.5 MG tablet Take 1 tablet (12.5 mg) by mouth daily.   [name] zolpidem (AMBIEN) 10 MG tablet Take 10 mg by mouth nightly as needed for Insomnia.       Allergies:  Allergies   Allergen Reactions   . Compazine Anxiety   . Latex Rash       Review of Systems  Constitutional: as per HPI. negative for: fatigue, night sweats, weight loss, fever.  Eyes: negative for:  blurry vision, double vision.  Ears, Nose, Mouth, Throat: negative for:  difficulty swallowing, nasal congestion  CV: negative for:  palpitations, chest pain, paroxysmal  nocturnal dyspnea  Resp: negative for: cough, hemoptysis, shortness of breath  GI:  as per HPI. negative for: heartburn or reflux, hematemesis,change in caliber of stool, hemorrhoids,jaundice.  GU:  as per HPI. negative for: nocturia, dysuria, decreased force of stream, frequency, hematuria  Musculoskeletal: negative for: AM joint stiffness, joint swelling, joint pain, joint redness  Integumentary: moles that have changed, dark lesions, rash, itching, bruising  Neuro: negative for:  seizures, paralysis/weakness, numbness or tingling, clumsiness, tremor.  Endo: negative for: cold intolerance, heat intolerance, polyphagia, polydipsia.  Heme/Lymphatic: negative for: bleeding disorder, abnormal bleeding, abnormal bruising.    Exam:   BP 145/89 (BP Location: Right arm, BP Patient Position: Semi-Fowlers)   Pulse 103   Temp 98.6 F (37 C)   Resp 16   Ht 6' (1.829 m)   Wt 120.7 kg (266 lb)   SpO2 96%   BMI 36.08 kg/m   NAD, awake, alert/oriented   Regular rate and rhythm  Non-labored breathing on room air  Abdomen rotund, soft and non-distended, non-tender   Extremities warm, well-perfused  Skin warm and dry    Labs:  Lab Results   Component Value Date    WBC 7.3 10/10/2018    HGB 15.2 10/10/2018    HCT 44.8 10/10/2018    PLT 106 (L) 10/10/2018     Lab Results   Component Value Date    INR 1.1 10/10/2018    PTT 36 (H) 10/10/2018     Lab Results   Component Value Date    NA 136 10/10/2018    K 3.8 10/10/2018    CL 96 (L) 10/10/2018    BICARB 25 10/10/2018    BUN 6 10/10/2018    CREAT 0.78 10/10/2018    GLU 126 (H) 10/10/2018    [place] 9.8 10/10/2018       Imaging:  CT A/P 10/11/2018  Findings consistent with acute uncomplicated appendicitis.     Assessment & Plan:  52 year old male with chronic (several years) diarrhea, constipation, bilateral lower quadrant fullness and bloating and pain causing nausea and emesis who presented to the ED for further evaluation. Given his symptoms, normal white count and benign abdominal  exam, it is unlikely that he has acute appendicitis. Furthermore, his CT scan is indicative of a fecalith, however, there is little evidence of fat stranding or a hyperemic appendix. At this time, recommend ED obs to further evaluate possible appendicitis in a few hours. Recommend no antibiotics, NPO and IVF. Surgery team will re-evaluate on morning rounds.     Please page [phone] with additional questions.     Discussed with chief resident, Dr. Jimmye Norman    --  Ricky Stabs, MD  General Surgery, PGY-1    --  Estill Batten. Lujuana Kapler  Resident Physician -- PGY2  General  Surgery

## 2018-10-11 NOTE — ED Notes (Signed)
Rounded on pt, surgery at bedside, pain addressed

## 2018-10-11 NOTE — ED Notes (Signed)
MD AT BEDSIDE ASSESSING PATIENT

## 2018-10-11 NOTE — ED OBS Progress Note (Signed)
ED OBS Progress Note    PT seen and examined while in ED Observation.    S: no complaints. Denies CP, palpitation, SOB.     Key physical exam findings:   Afebrile  PERRL EOMI.   Neck exam is non-tender. No Thyromegaly.   Chest exam is clear to auscultation bilaterally. No wheezes   Cardiac exam shows regular rate and rhythm. No murmurs auscultated.   Abdominal exam has bowel sounds present and soft. The abdomen is non-tender. No masses or rebound can be palpated.  Back exam has no costovertebral angle tenderness. No spinal tenderness to palpation.   Extremity exam is significant for 2/2 pulses.  Skin exam shows no lesions or rashes.  Neuro exam CN2-12 intact, MAEx4, sensation intact, gait steady     Plan in ED Observation:  Continue sx monitoring  Serial abd exams  surg eval w/fial rec's

## 2018-10-11 NOTE — ED OBS HPI (Signed)
ED OBS HPI/Admission Note    Please see provider note for details. Pt informed they are in ED Observation Status.    Reason for ED Observation:  Frequent abdominal re-evaluations for possible early appendicitis  Plan while in ED Observation: Repeat evaluation by surgery in the morning, NPO, IV fluid hydration

## 2018-10-11 NOTE — ED Notes (Signed)
To ct

## 2018-10-11 NOTE — ED Notes (Signed)
Paged rt

## 2018-10-11 NOTE — ED Notes (Signed)
PT DRANK 4OZ WATER WITHOUT DIFFICULTY.

## 2018-10-11 NOTE — ED Notes (Signed)
Pt ambulated to bathroom with steady gait. 

## 2018-10-11 NOTE — ED Notes (Signed)
PT C/O 8/10 RLQ ABD PAIN. WILL MEDICATE PER MD ORDER

## 2018-10-11 NOTE — ED Notes (Signed)
PT AWAKE, DENIES PAIN CURRENTLY. MD AWARE

## 2018-10-11 NOTE — Progress Notes (Signed)
General Surgery Progress Note    ID:  Brett Palmer is a 52 year old male with a h/o PTSD, GERD, EtOH abuse with elevated LFTs and fatty liver changes, obesity, SVT s/p ablation with baseline tachycardia (2016), cholecystectomy, who presented to the ED with abdominal pain for the past day.     Brett Palmer reports that he woke up with epigastric pain about 24 hours ago a/w nausea, loss of appetite, chills, and bilateral lower quadrant discomfort. He had one episode of bloody watery diarrhea prior to arriving to the ED. He reports intermittent bloating and lower abdomen discomfort with increasing frequency for the past year, a/w fluctuations between constipation and diarrhea, and difficulty urinating. He denies fever, emesis, dysuria, or weight loss.     His medical history is otherwise notable for colonoscopy in July 2019 (4 benign polyps resected), "pulmonary issues" with baseline 90-92% SpO2 with occasional albuterol use, a history of hypertension which he reports no longer needs medications for, and follows with hepatology for fatty liver. He currently drinks about 3 to 4 cups of wine per day, and takes 4mg  of xanax plus ambien 10mg  for night terrors.      Upon arrival to the ED, he was afebrile with BP of 174/100. CBC and lipase were unremarkable. General surgery was consulted due to concern for appendicitis on CT A/P.     Interval:  - Patient reports pain improved after having BM overnight  - Minimal pain now    PMH:  Past Medical History:   Diagnosis Date   . Alcohol use    . Gastroesophageal reflux disease    . Hypertension    . Migraine headache     part of gulf war syndrome   . PTSD (post-traumatic stress disorder) 1995-present    treated by Brett Palmer at [facility]    . SVT (supraventricular tachycardia) (CMS-HCC) 2011       PSH:  Past Surgical History:   Procedure Laterality Date   . GALLBLADDER SURGERY  2011       SH:  Social History     Socioeconomic History   . Marital status: Married     Spouse name: Brett Palmer     . Number of children: 2   . Years of education: Not on file   . Highest education level: Not on file   Occupational History   . 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 Smoking status: Former Smoker     Packs/day: 0.50     Years: 10.00     Pack years: 5.00     Types: Cigarettes     Last attempt to quit: 01/11/1990     Years since quitting: 28.7   . Smokeless tobacco: Never Used   Substance and Sexual Activity   . Alcohol use: Yes     Comment: down to 1/2-3/4 bottle of wine   . Drug use: No     Comment: Denies MJ use   . Sexual activity: Yes     Partners: Female   Social Activities of Daily Living Present   . Not on file   Social History Narrative   . Not on file       FH:  Family History   Problem Relation Name Age of Onset   . Cancer Father          melanoma, died at age 36   . Heart Disease Mother  CHF, thought to have passed from MI   . Heart Disease Maternal Aunt          CHF       Medications:  Current Facility-Administered Medications   Medication   . morphine injection 2 mg   . morphine injection 2 mg   . morphine injection 4 mg   . sodium chloride 0.9% infusion     Current Outpatient Medications   Medication Sig   . albuterol 108 (90 BASE) MCG/ACT inhaler Inhale 2 puffs by mouth every 6 hours as needed for Wheezing.   [name] ALPRAZolam (XANAX) 1 MG tablet Take 1 tablet (1 mg) by mouth 3 times daily as needed for Anxiety.   . Esomeprazole Magnesium (NEXIUM PO) daily.   . fluticasone propionate (FLONASE) 50 MCG/ACT nasal spray Spray 2 sprays into each nostril daily.   . hydrochlorothiazide (HYDRODIURIL) 12.5 MG tablet Take 1 tablet (12.5 mg) by mouth daily.   [name] zolpidem (AMBIEN) 10 MG tablet Take 10 mg by mouth nightly as needed for Insomnia.       Allergies:  Allergies   Allergen Reactions   . Compazine Anxiety   . Latex Rash       Exam:   BP 137/80 (BP Location: Right arm, BP Patient Position: Semi-Fowlers)   Pulse 90   Temp 98.2 F (36.8 C)   Resp 15   Ht 6'  (1.829 m)   Wt 120.7 kg (266 lb)   SpO2 96%   BMI 36.08 kg/m   NAD, awake, alert/oriented   Regular rate and rhythm  Non-labored breathing on room air  Abdomen rotund, soft and non-distended, minimal TTP in BLQ without rebound or guarding  Extremities warm, well-perfused  Skin warm and dry    Labs:  Lab Results   Component Value Date    WBC 7.3 10/10/2018    HGB 15.2 10/10/2018    HCT 44.8 10/10/2018    PLT 106 (L) 10/10/2018     Lab Results   Component Value Date    INR 1.1 10/10/2018    PTT 36 (H) 10/10/2018     Lab Results   Component Value Date    NA 136 10/10/2018    K 3.8 10/10/2018    CL 96 (L) 10/10/2018    BICARB 25 10/10/2018    BUN 6 10/10/2018    CREAT 0.78 10/10/2018    GLU 126 (H) 10/10/2018    [place] 9.8 10/10/2018       Imaging:  CT A/P 10/11/2018  Prelim read:  Findings consistent with acute uncomplicated appendicitis.     Assessment & Plan:  52 year old male with chronic (several years) diarrhea, constipation, bilateral lower quadrant fullness and bloating and pain causing nausea and emesis who presented to the ED for further evaluation. Given his symptoms, normal white count and benign abdominal exam, it is unlikely that he has acute appendicitis. Furthermore, his CT scan is indicative of a fecalith, however, there is little evidence of fat stranding or a hyperemic appendix.  His abdominal pain is more consistent with a chronic GI problem than with an acute issue such as appendicitis.  Recommend patient follow up with his PCP for GI referral and likely needs repeat upper endoscopy (last 1996).      Plan discussed and patient is in agreement.      Patient seen with Dr. Lily Palmer, Brett Palmer  General Surgery  Pager: [phone]

## 2018-10-11 NOTE — ED OBS Discharge (Signed)
ED OBSERVATION Discharge Note    Disposition: Discharge     Patient Name:  Brett Palmer    Principal ED Observation Diagnosis:  Abdominal Pain, Acute Generalized  Abdominal Pain s/p Colonoscopy  Diarrhea    Principal Procedure Performed During This Hospitalization:  CT Scan, IV Fluids, Pain Management.      Consultations Obtained During This Hospitalization:  General Surgery    ED Observation Course by Problem (required):  HPI this is a 52 year old male 1 month status post colonoscopy with removal of 3 polyps came in with acute onset of 3 days upper abdominal pain that radiates down to the lower abdomen associated with 4 episodes of diarrhea watery with some blood.  Denies any nausea or vomiting.  Denies any fevers or chills.  The pain was described as severe crampy and diffuse burning at times.  Denies any cough or shortness of breath no dizziness    Patient is worried about possible small-bowel obstruction however he had no nausea or vomiting.  He did have a colonoscopy there was therefore concern for remote complication of that such is an incomplete perforation or continued bleeding from his polyps.    Baseline labs were checkec, coags and morphine given for pain. CT of the abdomen also ordered.     All labs reviewed, cbc, cmp essentially normal.      CT Scan reviewed and there was concern for possible appendicitis.      General Surgery consulted and cleared patient, they do not believe he has appy at this time.     Results for orders placed or performed during the hospital encounter of 10/10/18   Comprehensive Metabolic Panel Green   Result Value Ref Range    Glucose 126 (H) 70 - 99 mg/dL    BUN 6 6 - 20 mg/dL    Creatinine 0.78 0.67 - 1.17 mg/dL    GFR >60 mL/min    Sodium 136 136 - 145 mmol/L    Potassium 3.8 3.5 - 5.1 mmol/L    Chloride 96 (L) 98 - 107 mmol/L    Bicarbonate 25 22 - 29 mmol/L    Anion Gap 15 7 - 15 mmol/L    Calcium 9.8 8.5 - 10.6 mg/dL    Total Protein 7.8 6.0 - 8.0 g/dL    Albumin 4.8 3.5 -  5.2 g/dL    Bilirubin, Tot 0.94 <1.2 mg/dL    AST (SGOT) 102 (H) 0 - 40 U/L    ALT (SGPT) 73 (H) 0 - 41 U/L    Alkaline Phos 87 40 - 129 U/L   CBC w/ Diff [place]   Result Value Ref Range    WBC 7.3 4.0 - 10.0 1000/mm3    RBC 4.89 4.60 - 6.10 mill/mm3    Hgb 15.2 13.7 - 17.5 gm/dL    Hct 44.8 40.0 - 50.0 %    MCV 91.6 79.0 - 95.0 um3    MCH 31.1 26.0 - 32.0 pgm    MCHC 33.9 32.0 - 36.0 g/dL    RDW 14.1 (H) 12.0 - 14.0 %    MPV 9.6 9.4 - 12.4 fL    Plt Count 106 (L) 140 - 370 1000/mm3    Segs 74 %    Lymphocytes 19 %    Monocytes 5 %    Eosinophils 2 %    Basophils 0 %    ANC-Automated 5.4 1.6 - 7.0 1000/mm3    Abs Lymphs 1.4 0.8 - 3.1 1000/mm3  Abs Monos 0.4 0.2 - 0.8 1000/mm3    Abs Eosinophils 0.1 <0.1 - 0.5 1000/mm3    Abs Basophils 0.0 <0.1 1000/mm3    Diff Type Automated    aPTT, Blood Blue   Result Value Ref Range    PTT 36 (H) 25 - 34 sec   Prothrombin Time, Blood Blue   Result Value Ref Range    PT,Patient 12.6 (H) 9.7 - 12.5 sec    INR 1.1    Lipase, Blood Green Plasma Separator Tube   Result Value Ref Range    Lipase 25 13 - 60 U/L       Discharge Condition (required):  Stable.    Key Physical Exam Findings at DISCHARGE Time:  Afebrile  PERRL EOMI.   Neck exam is non-tender. No Thyromegaly.   Chest exam is clear to auscultation bilaterally. No wheezes   Cardiac exam shows regular rate and rhythm. No murmurs auscultated.   Abdominal exam has bowel sounds present and soft. The abdomen minimal generalized tenderness; . No masses or rebound can be palpated; neg rovsing, neg mcburney, neg obturator, no pain with right heel strike;   Back exam has no costovertebral angle tenderness. No spinal tenderness to palpation.   Extremity exam is significant for 2/2 pulses.  Skin exam shows no lesions or rashes.  Neuro exam CN2-12 intact, MAEx4, sensation intact, gait steady       Discharge Medications:     What To Do With Your Medications      START taking these medications      Add'l Info   dicyclomine 10 MG  capsule  Commonly known as:  BENTYL  Take 1 capsule (10 mg) by mouth 3 times daily (before meals) for 5 days.   Quantity:  15 capsule  Refills:  0        CONTINUE taking these medications      Add'l Info   albuterol 108 (90 Base) MCG/ACT inhaler  Inhale 2 puffs by mouth every 6 hours as needed for Wheezing.   Quantity:  1 Inhaler  Refills:  2     ALPRAZolam 1 MG tablet  Commonly known as:  XANAX  Take 1 tablet (1 mg) by mouth 3 times daily as needed for Anxiety.   Quantity:  12 tablet  Refills:  0     fluticasone propionate 50 MCG/ACT nasal spray  Commonly known as:  FLONASE  Spray 2 sprays into each nostril daily.   Quantity:  1 bottle  Refills:  11     hydrochlorothiazide 12.5 MG tablet  Commonly known as:  HYDRODIURIL  Take 1 tablet (12.5 mg) by mouth daily.   Quantity:  30 tablet  Refills:  1     NEXIUM PO  daily.   Refills:  0     zolpidem 10 MG tablet  Commonly known as:  AMBIEN  Take 10 mg by mouth nightly as needed for Insomnia.   Refills:  0           Where to Get Your Medications      Please check with staff for printed prescription or if prescription was faxed to your pharmacy.    Bring a paper prescription for each of these medications   dicyclomine 10 MG capsule         Discharge Disposition:  Home.    Follow Up Plan:  Will follow up with PCP in 2 days.  Referral placed for GI Consult/Follow up  STAT.  Will obtain stool studies outpatient for review with PCP / GI.     RTED precautions discussed at length, including fever, chills, PO intolerance, increased pain, any other concerning symptoms. 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     Care plan and ED Course reviewed with patient along with follow-up recommendations. All questions were answered and return precautions were reviewed.    The patient was counseled regarding the workup findings and the provisional diagnosis as well as the findings during ED OBSERVATION period, and all questions were answered. Instructed the  patient to follow up with their regular physicians in the coming days for a recheck and further evaluation of all symptoms and complaints. Will discharge the patient with aftercare instructions.     The patient was given detailed verbal instructions with regards to return parameters. The patient was also given detailed verbal instructions with regards to follow-up, and clearly demonstrated an understanding of and agreement with the instructions and plan.

## 2018-10-11 NOTE — ED Notes (Signed)
Assisting primary RN with D/C. Pt given Rx for Bentyl, information on abdominal pain given. Referral to GI given. Pt verbalized understanding.

## 2018-10-11 NOTE — ED Notes (Signed)
SURGERY AT BEDSIDE

## 2018-10-11 NOTE — ED OBS Progress Note (Signed)
ED OBS Progress Note    PT seen and examined while in ED Observation.    S:     As per ED HPI:   this is a 52 year old male 1 month status post colonoscopy with removal of 3 polyps now comes in with acute onset of 3 days upper abdominal pain that radiates down to the lower abdomen associated with 4 episodes of diarrhea watery with some blood.  Denies any nausea or vomiting.  Denies any fevers or chills.  The pain is severe crampy and diffuse burning at times.  Denies any cough or shortness of breath no dizziness.    Today patient feels much improved. Minimal, crampy, diffused abdominal pain. Denies n/v/d today.       Key physical exam findings:  Physical Exam  Alert male in mild distress  Vital signs hypertensive  HEENT exam no scleral icterus extraocular motion intact pupils are light pharynx clear mucosa moist  Neck is supple without JVD or adenopathy no thyromegaly  Lungs clear good breath sounds  Coronary regular without murmurs or gallops  Abdomen: positive bowel sounds diffuse minimal tenderness without rebound or guarding, negative Rosving's, negative obturator, no pain with right heel strike;   Genitalia normal male nontender  Rectal no stool some mucus occult heme-positive  Back no CVA tenderness  Lower extremities without edema, positive pain with heel lb, negative obturator and negative psoas  Skin no rash      Plan in ED Observation:   Patient much improved today, vitals stable, CT neg for Appendicitis / diverticulitis / obstruction / perforation.   Plan to discharge.

## 2018-10-11 NOTE — ED Notes (Signed)
Returned from ct 

## 2018-10-11 NOTE — ED Notes (Signed)
Pt spouse left for home, left phone number: her name is Ardelia Mems [phone]

## 2018-10-11 NOTE — ED MD Progress Note (Signed)
Patient signed out to me pending surgical evaluation.    Surgery has seen and examined patient does not feel that patient is acute appendicitis.  Would like patient admitted to ED observation will continue to observe patient would like patient to be kept NPO with IV fluid hydration and will reexamine patient in the morning.

## 2018-10-11 NOTE — ED Notes (Signed)
Pt requested phenergan, md to place order

## 2018-10-11 NOTE — ED EKG Interpretation (Signed)
ED EKG Interpretation    EKG: Normal Sinus Rhythm and No ST elevations or depressions with Normal Axis and unchanged from prior tracing and nonspecific ST and T wave changes.

## 2018-10-11 NOTE — ED Notes (Addendum)
ASSUMED CARE OF PATIENT. PT SLEEPING COMFORTABLY IN NAD AT THIS TIME. REMAINS ON BIPAP AND FULL CARDIAC MONITOR

## 2018-10-13 ENCOUNTER — Encounter (HOSPITAL_BASED_OUTPATIENT_CLINIC_OR_DEPARTMENT_OTHER): Payer: Self-pay | Admitting: Transplant Hepatology

## 2018-10-14 ENCOUNTER — Other Ambulatory Visit: Payer: Commercial Managed Care - HMO | Attending: Family Medicine

## 2018-10-14 ENCOUNTER — Encounter (HOSPITAL_BASED_OUTPATIENT_CLINIC_OR_DEPARTMENT_OTHER): Payer: Self-pay

## 2018-10-14 ENCOUNTER — Encounter (HOSPITAL_COMMUNITY): Payer: Self-pay

## 2018-10-14 ENCOUNTER — Encounter (HOSPITAL_BASED_OUTPATIENT_CLINIC_OR_DEPARTMENT_OTHER): Payer: Self-pay | Admitting: Gastroenterology

## 2018-10-14 ENCOUNTER — Ambulatory Visit: Payer: Commercial Managed Care - HMO | Attending: Physician Assistant | Admitting: Gastroenterology

## 2018-10-14 VITALS — BP 145/89 | HR 108 | Temp 97.0°F | Resp 16 | Ht 72.0 in | Wt 269.0 lb

## 2018-10-14 DIAGNOSIS — R140 Abdominal distension (gaseous): Secondary | ICD-10-CM | POA: Insufficient documentation

## 2018-10-14 DIAGNOSIS — R197 Diarrhea, unspecified: Secondary | ICD-10-CM | POA: Insufficient documentation

## 2018-10-14 DIAGNOSIS — R109 Unspecified abdominal pain: Secondary | ICD-10-CM | POA: Insufficient documentation

## 2018-10-14 DIAGNOSIS — Z1211 Encounter for screening for malignant neoplasm of colon: Secondary | ICD-10-CM

## 2018-10-14 DIAGNOSIS — K3580 Unspecified acute appendicitis: Secondary | ICD-10-CM | POA: Insufficient documentation

## 2018-10-14 DIAGNOSIS — R1084 Generalized abdominal pain: Secondary | ICD-10-CM | POA: Insufficient documentation

## 2018-10-14 DIAGNOSIS — K760 Fatty (change of) liver, not elsewhere classified: Secondary | ICD-10-CM | POA: Insufficient documentation

## 2018-10-14 LAB — GASTROINTESTINAL PATHOGEN NUCLEIC ACID DETECTION TEST, STOOL
Adenovirus F 40/41 PCR, Stool: NOT DETECTED
Astrovirus PCR, Stool: NOT DETECTED
Campylobacter species PCR, Stool: NOT DETECTED
Clostridium difficile (toxin A/B) PCR, Stool: NOT DETECTED
Cryptosporidium PCR, Stool: NOT DETECTED
Cyclospora cayetanensis PCR, Stool: NOT DETECTED
Entamoeba histolytica PCR, Stool: NOT DETECTED
Enteroaggregative E. coli (EAEC) PCR, Stool: NOT DETECTED
Enteropathogenic E. coli (EPEC) PCR, Stool: NOT DETECTED
Enterotoxigenic E. coli (ETEC) PCR, Stool: NOT DETECTED
Giardia lamblia PCR, Stool: NOT DETECTED
Norovirus GI/GII PCR, Stool: NOT DETECTED
Plesiomonas shigelloides PCR, Stool: NOT DETECTED
Rotavirus A PCR, Stool: NOT DETECTED
Salmonella species PCR, Stool: NOT DETECTED
Sapovirus (I, II, IV, and V) PCR, Stool: NOT DETECTED
Shiga-like toxin-producing E. coli (STEC) PCR, Stool: NOT DETECTED
Shigella/Enteroinvasive E. coli (EIEC) PCR, Stool: NOT DETECTED
Vibrio cholerae PCR, Stool: NOT DETECTED
Vibrio species PCR, Stool: NOT DETECTED
Yersinia enterocolitica PCR, Stool: NOT DETECTED

## 2018-10-14 LAB — STOOL IMMUNOCHEMICAL OCCULT BLOOD: Occult Blood, Immunochem: POSITIVE — AB

## 2018-10-14 NOTE — Patient Instructions (Signed)
Please have the stool and breath test done.  Please follow low-FODMAP diet for 4 weeks.  I placed the stat referral for colorectal surgery team to discuss the appendicitis management.   Please come back to ED if worsening right lower quadrant pain, fever, chills etc.

## 2018-10-15 ENCOUNTER — Encounter (INDEPENDENT_AMBULATORY_CARE_PROVIDER_SITE_OTHER): Payer: Self-pay | Admitting: Gastroenterology

## 2018-10-15 ENCOUNTER — Encounter (INDEPENDENT_AMBULATORY_CARE_PROVIDER_SITE_OTHER): Payer: Self-pay

## 2018-10-16 LAB — CALPROTECTIN, FECAL: Calprotectin, Fecal: <16 ug/g (ref <=50)

## 2018-10-19 ENCOUNTER — Other Ambulatory Visit (INDEPENDENT_AMBULATORY_CARE_PROVIDER_SITE_OTHER): Payer: Self-pay | Admitting: Transplant Hepatology

## 2018-10-26 ENCOUNTER — Ambulatory Visit: Payer: Commercial Managed Care - HMO | Attending: Surgery | Admitting: Surgery

## 2018-10-26 DIAGNOSIS — R1084 Generalized abdominal pain: Secondary | ICD-10-CM | POA: Insufficient documentation

## 2018-10-26 NOTE — Progress Notes (Signed)
GENERAL SURGERY H&P    Date of Evaluation:  October 26, 2018    Chief Complaint: Abdominal Pain     History of Present Illness:  This is a 52 year old male with a history of PTSD, chronic abdominal pain/distension/nausea/constipation presenting to clinic with 2 weeks of off and on severe abdominal pain here for evaluation of possible appendicitis. Initially presented to ED at [facility] with severe abdominal pain on 10/11/18 that has been off and on ever since, now presenting to clinic. 10/10 pain at its worst that mostly localizes to the RLQ but does move. He has also had nausea chronically that has been worse sine the 11th. No vomiting. Endorses diarrhea. He takes magnesium citrate to keep him regular because if he does not then he gets constipated. CT was read as acute uncomplicated appendicitis with fecalith. After several discussions, general surgery opted against appendectomy given his normal white count, benign abdominal exam, with little evidence of fat stranding or a hyperemic appendix. He also reports recent weight loss. He was referred to see GI outpatient who recommended low FODMAP diet which he reports has improved his bloating significantly, although is pain has persisted. Has endoscopy scheduled for 12/18. Colonoscopy in July 2019 (4 benign polyps resected),     Cholecystectomy in 2011. His medical history is otherwise notable for "pulmonary issues" with baseline 90-92% SpO2 with occasional albuterol use, a history of hypertension which he reports no longer needs medications for, and follows with hepatology for fatty liver. He currently drinks about 1.5 bottles of wine per day, and takes 4mg  of xanax plus ambien 10mg  for night terrors.      Past Medical History;  Past Medical History:   Diagnosis Date   . Alcohol use    . Gastroesophageal reflux disease    . Hypertension    . Migraine headache     part of gulf war syndrome   . PTSD (post-traumatic stress disorder) 1995-present    treated by Dr. Joen Laura  at [facility]    . SVT (supraventricular tachycardia) (CMS-HCC) 2011     Family History:  Family History   Problem Relation Name Age of Onset   . Cancer Father          melanoma, died at age 4   . Heart Disease Mother          CHF, thought to have passed from MI   . Heart Disease Maternal Aunt          CHF     Social History:  Tobacco: He reports that he quit smoking about 28 years ago. His smoking use included cigarettes. He has a 5.00 pack-year smoking history. He has never used smokeless tobacco.  Alcohol:  He reports current alcohol use. 1.5 bottles of wine daily  Substance Abuse: He reports no history of drug use.    Allergies:  Allergies   Allergen Reactions   . Compazine Anxiety   . Latex Rash     Medications:  Current Outpatient Medications   Medication Sig   . albuterol 108 (90 BASE) MCG/ACT inhaler Inhale 2 puffs by mouth every 6 hours as needed for Wheezing.   [name] ALPRAZolam (XANAX) 1 MG tablet Take 1 tablet (1 mg) by mouth 3 times daily as needed for Anxiety.   . dicyclomine (BENTYL) 10 MG capsule Take 1 capsule (10 mg) by mouth 3 times daily (before meals) for 5 days.   . Esomeprazole Magnesium (NEXIUM PO) daily.   . fluticasone propionate (FLONASE)  50 MCG/ACT nasal spray Spray 2 sprays into each nostril daily.   . hydrochlorothiazide (HYDRODIURIL) 12.5 MG tablet Take 1 tablet (12.5 mg) by mouth daily.   [name] zolpidem (AMBIEN) 10 MG tablet Take 10 mg by mouth nightly as needed for Insomnia.     No current facility-administered medications for this visit.      Review of Systems:  Constitutional:  reports weight loss  Head & Neck:  reports headache  Respiratory:  reports shortness of breath   Cardiovascular:  denies chest pain/pressure  Gastrointestinal:  reports abdominal pain, nausea and diarrhea  Genitourinary:  denies frequency  Musculoskeletal:  denies joint pain  Neurological:  denies focal numbness and weakness    All other Review of Systems is negative.    Physical Exam:  Vital Signs:  There were no vitals  taken for this visit.  General:  male healthy, no distress  HEENT:  Normal and NCAT  Lungs:  chest clear, no wheezing, rales, normal symmetric air entry, Heart exam - S1, S2 normal, no murmur, no gallop, rate regular  Heart:  RRR no m/r/g  Abdomen: Diffuse TTP, non-distended, NBS, non-peritonitic, hepatomegaly   Neurological:  mental status, speech normal, alert and oriented x iii  Skin:  Skin color, texture, turgor normal. No rashes or lesions    Diagnostic Data:  X-ray Chest Single View    Result Date: 10/11/2018  IMPRESSION: No clear explanation for abdominal pain. No evidence of pneumoperitoneum.    Ct Abdomen And Pelvis With Contrast    Result Date: 10/11/2018  IMPRESSION: CT scan of the abdomen and pelvis with IV contrast. Acute uncomplicated appendicitis. Hepatomegaly to 24 cm with diffusely hypoattenuating hepatic parenchyma, compatible with known diffuse hepatic steatosis.  Critical Results:  Significant findings delineated above were seen at 0130 hours on 10/11/2018 and verbally communicated by Dr. Ali Lowe to the care provider, Shanon Payor, at 18:41 on 10/11/2018.   CTRM:2001:verbal.     Assessment:  52 year old male with a history of PTSD, chronic abdominal pain/distension/nausea/constipation presenting to clinic with 2 weeks of off and on severe abdominal pain here for evaluation of possible appendicitis. Agree with initial evaluation that this abdominal pain is more consistent with an exacerbation of chronic GI issue as opposed to acute appendicitis given normal WBC count, consistently umimpressive abdominal exam, and a CT that, although showed a fecalith, did not demonstrate clear evidence of acute inflammation per our review. Encouraged by improvement in patient's symptoms with initiation of low FODMAP diet.     Plan:  - No indication for surgical intervention   - Follow up with GI  - Endoscopy per GI   - Patient educated on importance of dietary changes, encouraged weight loss, offered  options for support in the form of meditation and support groups    Return for Follow-Up: Prn      Levora Dredge, MS3    Patient seen and discussed with Dr. Sandi Mariscal

## 2018-10-31 ENCOUNTER — Encounter (INDEPENDENT_AMBULATORY_CARE_PROVIDER_SITE_OTHER): Payer: Self-pay | Admitting: Family Medicine

## 2018-11-01 NOTE — Telephone Encounter (Signed)
From: Brett Palmer  To: [name], MD  Sent: 10/31/2018 4:51 PM PST  Subject: 20-Other    Hello Dr. Salvadore Dom,    Could you please send an extension letter for Brett Palmer, my wife's FMLA? I have so many appointments this month, with the appendicitis and now gastro and surgery. She is helping me get into a routine that has helped with the ptsd, but the added stress of all the other issues, I would like to have her with me a few weeks more.    If you could extend it another 4 weeks, that should be enough to get through.    I appreciate your help.    Regards,    Brett Palmer

## 2018-11-03 ENCOUNTER — Encounter (HOSPITAL_BASED_OUTPATIENT_CLINIC_OR_DEPARTMENT_OTHER): Payer: Self-pay | Admitting: Surgery

## 2018-11-03 NOTE — Progress Notes (Signed)
Visit practitioner:  Sherlynn Carbon, MD    Date / Time: seen on 10/14/2018 10:04 AM    Referring Provider: Eldridge Abrahams     Reason for Visit: abdominal pain    Type of Visit: Consult    History of Present Illness:   Brett Palmer is a 52 year old male patient is here for a consultation to evaluate for rlq abdominal pain.    Last Sunday night started having epigastric pain w/o obvious triggers, with 3-4 loose stools. Then developed abdominal distention in upper abdomen. Epigastric pain travelled to rlq in 16 hrs. Pain is "like burning, cramping".  Had n//chills/"felt feverish/teeth shattering".No vomiting.  Came to ED on 11/10. Had labs, no elevated wbc.    CT-abd/pelvis is done and there was concern for possible appendicitis.      """""SMALL & LARGE BOWEL: Periappendiceal stranding and edema around an appendicolith containing appendix.  BLADDER/PELVIC ORGANS: Unremarkable  BONES/SOFT TISSUES: Unremarkable  OTHER: None    IMPRESSION:  CT scan of the abdomen and pelvis with IV contrast.    Acute uncomplicated appendicitis.    Hepatomegaly to 24 cm with diffusely hypoattenuating hepatic parenchyma, compatible with known diffuse hepatic steatosis."""""      General Surgery consulted and, they did not believe he has appendicits at this time.   He was sent to GI.      After ed d/c, pain is a little better. Drinks alkaline water and believes it helps with bloating component but not rlq pain. Eating bland food also helps. If he coughs, sneezes, or strains, feels the pain in rlq.       He does report bloating for the last 2 yrs. Same before and after his last colonoscopy in 05/2018. I clearly told him that he did not have the current symptoms due to his prior colonoscopy months ago.    Bloating is all over abdomen, not much burping. Stools are loose, mostly diarrhea for a few times a day for multiple days,  then no bm for 3 days. Then takes mg citrate which gives him diarrhea. Does not use imodium as he does not  want to stop the bm and become constipated.    Last colonoscopy on 06/29/18, 4 polyps removed, repeat in 5 yrs.    08/10/18 MRI-abd w contrast, negative for any appendiceal abnormality.    Was seen by hepatology and has egd scheduled with them on 11/17/18.      Patient Active Problem List    Diagnosis Date Noted   . Alcoholic cirrhosis of liver without ascites (CMS-HCC) 09/15/2018   . Inadequate exercise - not at goal 03/23/2018   . Transaminitis 09/06/2015   . ETOH abuse 12/15/2012   . PTSD (post-traumatic stress disorder) 12/15/2012   . Palpitations 12/13/2012   . Hypertensive disorder 04/23/2011   . Anxiety 04/23/2011       Past Medical History:   Diagnosis Date   . Alcohol use    . Gastroesophageal reflux disease    . Hypertension    . Migraine headache     part of gulf war syndrome   . PTSD (post-traumatic stress disorder) 1995-present    treated by Dr. Joen Laura at [facility]    . SVT (supraventricular tachycardia) (CMS-HCC) 2011       Past Surgical History:   Procedure Laterality Date   . GALLBLADDER SURGERY  2011       Current Medications:  albuterol 108 (90 BASE) MCG/ACT inhaler, Inhale 2 puffs by mouth every  6 hours as needed for Wheezing.  ALPRAZolam (XANAX) 1 MG tablet, Take 1 tablet (1 mg) by mouth 3 times daily as needed for Anxiety.  dicyclomine (BENTYL) 10 MG capsule, Take 1 capsule (10 mg) by mouth 3 times daily (before meals) for 5 days.  Esomeprazole Magnesium (NEXIUM PO), daily.  fluticasone propionate (FLONASE) 50 MCG/ACT nasal spray, Spray 2 sprays into each nostril daily.  hydrochlorothiazide (HYDRODIURIL) 12.5 MG tablet, Take 1 tablet (12.5 mg) by mouth daily.  zolpidem (AMBIEN) 10 MG tablet, Take 10 mg by mouth nightly as needed for Insomnia.        Allergies:  Allergies   Allergen Reactions   . Compazine Anxiety   . Latex Rash       Family History:  Family History   Problem Relation Name Age of Onset   . Cancer Father          melanoma, died at age 89   . Heart Disease Mother          CHF, thought to  have passed from MI   . Heart Disease Maternal Aunt          CHF       Social History     Socioeconomic History   . Marital status: Married     Spouse name: [name]   . Number of children: 2   . Years of education: Not on file   . Highest education level: Not on file   Occupational History   . 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 Smoking status: Former Smoker     Packs/day: 0.50     Years: 10.00     Pack years: 5.00     Types: Cigarettes     Last attempt to quit: 01/11/1990     Years since quitting: 28.8   . Smokeless tobacco: Never Used   Substance and Sexual Activity   . Alcohol use: Yes     Comment: down to 1/2-3/4 bottle of wine   . Drug use: No     Comment: Denies MJ use   . Sexual activity: Yes     Partners: Female   Social Activities of Daily Living Present   . Not on file   Social History Narrative   . Not on file       Review of Systems:See HPI for positives. Otherwise 14 system review is done and negative.     Physical examination:   BP 145/89 (BP Location: Right arm, BP Patient Position: Sitting, BP cuff size: Large)   Pulse 108   Temp 97 F (36.1 C) (Temporal)   Resp 16   Ht 6' (1.829 m)   Wt 122 kg (269 lb)   SpO2 94%   BMI 36.48 kg/m  Body mass index is 36.48 kg/m.  Wt Readings from Last 2 Encounters:   10/14/18 122 kg (269 lb)   10/10/18 120.7 kg (266 lb)      Blood Pressure   10/14/18 145/89   10/11/18 128/64      Pain Score: 0  General:  Well developed, well nourished in no apparent distress.  Affect:  Normal  HEENT:  No scleral icterus, Trachea midline, mucus membranes moist.  Lungs:  Clear to auscultation bilaterally. Normal respiratory effort.  Cardiovascular:  Regular rate.  Abdomen:  Abdomen soft, focal tenderness over the rlq, positive McBurney's on my exam. No rebound. Has voluntary guarding. No palpable mass. Bowel  sound present.  Extremities:  No peripheral edema. Warm well-perfused.  Skin:  Non-icteric and no visible rash  Neuro:  Cranial  nerves II-XII, sensation, all motor strength grossly intact.  Psych:  Alert oriented X3, No anxiety or depression. Normal affect.    Lab Results: I rev'ed the labs  Lab Results   Component Value Date    WBC 7.3 10/10/2018    RBC 4.89 10/10/2018    HGB 15.2 10/10/2018    HCT 44.8 10/10/2018    MCV 91.6 10/10/2018    MCHC 33.9 10/10/2018    RDW 14.1 (H) 10/10/2018    PLT 106 (L) 10/10/2018    MPV 9.6 10/10/2018     Lab Results   Component Value Date    BUN 6 10/10/2018    CREAT 0.78 10/10/2018    CL 96 (L) 10/10/2018    NA 136 10/10/2018    K 3.8 10/10/2018    Rio 9.8 10/10/2018    TBILI 0.94 10/10/2018    ALB 4.8 10/10/2018    TP 7.8 10/10/2018    AST 102 (H) 10/10/2018    ALK 87 10/10/2018    BICARB 25 10/10/2018    ALT 73 (H) 10/10/2018    GLU 126 (H) 10/10/2018     Lab Results   Component Value Date    AST 102 (H) 10/10/2018    ALT 73 (H) 10/10/2018    GGT 452 (H) 07/21/2018    ALK 87 10/10/2018    TP 7.8 10/10/2018    ALB 4.8 10/10/2018    TBILI 0.94 10/10/2018    DBILI 0.2 02/24/2017       Impression and plan:     ICD-10-CM ICD-9-CM    1. Bloating R14.0 787.3 SIBO Breath Test      Calprotectin, Fecal   2. Diarrhea, unspecified type R19.7 787.91 SIBO Breath Test      Calprotectin, Fecal   3. Acute appendicitis, unspecified acute appendicitis type K35.80 540.9 General Surgery/Colorectal Clinic (Non-Cancer Diagnosis)     52 y/o patient with chronic diarrhea and bloating for 2 yrs (we will work this up at this time which is a chronic symptom) but with separate epigastric pain which then travelled to rlq pain over 16 hrs over the last weekend, seen in ED and CT showed acute uncomplicated appendicitis. Still has pain in rlq, and has positive McBurney's. My concern is still low grade appendicitis with the positive exam and imaging findings. I would like surgery team to reevaluate, even if surgery is not needed, at least to establish and discuss the need for antibiotics.  Celiac serology was negative in  07/2018.    Plan:  1. RLQ pain, positive McBurney's sign on exam,  CT findings of acute uncomplicated appendicitis:  - Placed a stat referral for colorectal surgery team to reevaluate, even if surgery is not needed, at least to establish and discuss the need for antibiotics.  - Advised to come back to ED if worsening right lower quadrant pain, fever, chills, n/v etc.    2. Chronic bloating and altered bowel habits for 2 years.  - We will obtain fecal calprotectin.  - Await results of the GI pathogen testing from ED (was not back at the time of the visit, but came back negative before note completion).  - Schedule SIBO breath test.  - Advised to follow low-FODMAP diet for 4 weeks. If helpful then can liberalize the diet by adding 1-2 food items at a time.    I  answered all the questions that the patient had to the satisfaction.  Thank you for this consult.  He will contact us if any questions or concerns.

## 2018-11-04 ENCOUNTER — Encounter (INDEPENDENT_AMBULATORY_CARE_PROVIDER_SITE_OTHER): Payer: Self-pay | Admitting: Family Medicine

## 2018-11-04 ENCOUNTER — Telehealth (INDEPENDENT_AMBULATORY_CARE_PROVIDER_SITE_OTHER): Payer: Self-pay | Admitting: Family Medicine

## 2018-11-04 ENCOUNTER — Encounter (HOSPITAL_BASED_OUTPATIENT_CLINIC_OR_DEPARTMENT_OTHER): Payer: Self-pay | Admitting: Gastroenterology

## 2018-11-04 NOTE — Telephone Encounter (Signed)
From: Jackalyn Lombard  To: [name], MD  Sent: 11/04/2018 12:34 PM PST  Subject: [name] Afternoon,    I sent you a message Monday regarding extending my wife's fmla to the end of December. I have several appointments this month, as well as appendicitis that flairs up on and off and it would be really helpful to have her take me to my appointments. General surgery is just waiting for the appendicitis to show and if I am by myself, it would be extremely stressful not to have my wife with me.    It has been a huge relief this past month and I would really appreciate her continuing to be with me for the next 4 weeks to get through the remaining appointments this year.    If you could please write a letter to extend her FMLA for another 4 weeks.    Regards,    Waymond Cera

## 2018-11-04 NOTE — Telephone Encounter (Signed)
Who is calling: Incoming call from patient     Insurance Coverage Verified: Active- in network     Reason for this call: Patient is following up on status in regards to message below. Patient is wanting to know when the letter for patient's wife be ready. Agent unable to reach nurse via lync or extension. Please Assist    Action required by office: Please contact caller    Duplicate encounter? Yes, please refer to MyChart encounter dated 10/31/2018     Best way to contact: [phone]  Alternative: my chart    Inquiry has been read verbatim to this caller. Verbalizes satisfaction and confirms the above is accurate: yes      Has been advised this message will be transmitted to office and can expect a response within the next 24-72 hours.

## 2018-11-05 ENCOUNTER — Encounter (INDEPENDENT_AMBULATORY_CARE_PROVIDER_SITE_OTHER): Payer: Self-pay | Admitting: Family Medicine

## 2018-11-05 NOTE — Telephone Encounter (Signed)
Who is calling: Incoming call from patient  Insurance Coverage Verified: Active- in network  Reason for this call: Pt calling again requesting to speak with [facility] a patient advocate   No one available to speak with Pt.     Pt was on hold for 15 min and disconnected call     Action required by office: Please expedite request and Please contact caller    Duplicate encounter? No previous documentation found on this issue.     Best way to contact: [phone]  Alternative:     Inquiry has been read verbatim to this caller. Verbalizes satisfaction and confirms the above is accurate: yes      Has been advised this message will be transmitted to office and can expect a response within the next 24-72 hours.

## 2018-11-05 NOTE — Telephone Encounter (Signed)
Addressed in other encounter 

## 2018-11-08 NOTE — Telephone Encounter (Signed)
From: Jackalyn Lombard  To: [name], MD  Sent: 11/05/2018 3:33 PM PST  Subject: 5-General Compliment    Thank you, very much. Sorry for being so inpatient. I have just being so uncomfortable with this Abd pain.    Happy Holidays to you and your family.    [name] [facility]

## 2018-11-08 NOTE — Telephone Encounter (Signed)
RN triage   called & left message to voicemail to  Call us  back.  Any  RNTriage can take the  Call back.  My  Chart message sent.

## 2018-11-08 NOTE — Telephone Encounter (Signed)
From: Jackalyn Lombard  To: [name], MD  Sent: 11/05/2018 6:18 PM PST  Subject: 20-Other    I will speak to my G.I. doctor about it. But ever since The colonoscopy, I've had bouts of large blood but it's bright red but it's but like last Sunday I had just about 200 mL of blood, bowel movement I had I've had these episodes off a on since the colonoscopy.     Best regards   Waymond Cera  ----- Message -----  From: [name], MD  Sent: 11/05/2018 3:29 PM PST  To: Jackalyn Lombard  Subject: RE: 20-Other  New letter written for you and you should be able to print from [facility].  Also, I just received a result that your stool sample was positive for blood. However, with your chronic diarrhea, this is not surprising. I'd suggest you follow up with GI on this.  Sincerely,  [name], MD      ----- Message -----   From: Jackalyn Lombard   Sent: 10/31/2018 4:51 PM PST   To: [name], MD  Subject: 20-Other    Hello Dr. Salvadore Dom,    Could you please send an extension letter for [name], my wife's FMLA? I have so many appointments this month, with the appendicitis and now gastro and surgery. She is helping me get into a routine that has helped with the ptsd, but the added stress of all the other issues, I would like to have her with me a few weeks more.    If you could extend it another 4 weeks, that should be enough to get through.    I appreciate your help.    Regards,    Jackalyn Lombard

## 2018-11-17 ENCOUNTER — Encounter (HOSPITAL_BASED_OUTPATIENT_CLINIC_OR_DEPARTMENT_OTHER): Admission: RE | Disposition: A | Discharge status: Home Health | Payer: Self-pay | Attending: Transplant Hepatology

## 2018-11-17 ENCOUNTER — Ambulatory Visit
Admission: RE | Admit: 2018-11-17 | Discharge: 2018-11-17 | Disposition: A | Discharge status: Home / Self Care | Payer: Commercial Managed Care - HMO | Attending: Transplant Hepatology | Admitting: Transplant Hepatology

## 2018-11-17 DIAGNOSIS — K7030 Alcoholic cirrhosis of liver without ascites: Secondary | ICD-10-CM | POA: Insufficient documentation

## 2018-11-17 DIAGNOSIS — K219 Gastro-esophageal reflux disease without esophagitis: Secondary | ICD-10-CM | POA: Insufficient documentation

## 2018-11-17 DIAGNOSIS — Z888 Allergy status to other drugs, medicaments and biological substances status: Secondary | ICD-10-CM | POA: Insufficient documentation

## 2018-11-17 DIAGNOSIS — F4312 Post-traumatic stress disorder, chronic: Secondary | ICD-10-CM | POA: Insufficient documentation

## 2018-11-17 DIAGNOSIS — I471 Supraventricular tachycardia: Secondary | ICD-10-CM | POA: Insufficient documentation

## 2018-11-17 DIAGNOSIS — G43909 Migraine, unspecified, not intractable, without status migrainosus: Secondary | ICD-10-CM | POA: Insufficient documentation

## 2018-11-17 DIAGNOSIS — Z79899 Other long term (current) drug therapy: Secondary | ICD-10-CM | POA: Insufficient documentation

## 2018-11-17 DIAGNOSIS — K3189 Other diseases of stomach and duodenum: Secondary | ICD-10-CM | POA: Insufficient documentation

## 2018-11-17 DIAGNOSIS — I10 Essential (primary) hypertension: Secondary | ICD-10-CM | POA: Insufficient documentation

## 2018-11-17 DIAGNOSIS — Z91040 Latex allergy status: Secondary | ICD-10-CM | POA: Insufficient documentation

## 2018-11-17 DIAGNOSIS — Z9049 Acquired absence of other specified parts of digestive tract: Secondary | ICD-10-CM | POA: Insufficient documentation

## 2018-11-17 DIAGNOSIS — K7460 Unspecified cirrhosis of liver: Secondary | ICD-10-CM | POA: Insufficient documentation

## 2018-11-17 DIAGNOSIS — K766 Portal hypertension: Secondary | ICD-10-CM | POA: Insufficient documentation

## 2018-11-17 SURGERY — ESOPHAGOGASTRODUODENOSCOPY (EGD)
Anesthesia: Moderate Sedation - by non-anesthesia staff only

## 2018-11-17 MED ORDER — SODIUM CHLORIDE 0.9 % IV SOLN
INTRAVENOUS | Status: AC | PRN
Start: 2018-11-17 — End: 2018-11-17
  Administered 2018-11-17: 500 mL via INTRAVENOUS

## 2018-11-17 MED ORDER — DIPHENHYDRAMINE HCL 50 MG/ML IJ SOLN
INTRAMUSCULAR | Status: DC | PRN
Start: 2018-11-17 — End: 2018-11-17
  Administered 2018-11-17 (×2): 25 mg via INTRAVENOUS

## 2018-11-17 MED ORDER — MIDAZOLAM HCL 5 MG/5ML IJ SOLN
INTRAMUSCULAR | Status: DC | PRN
Start: 2018-11-17 — End: 2018-11-17
  Administered 2018-11-17: 3 mg via INTRAVENOUS

## 2018-11-17 MED ORDER — FENTANYL CITRATE (PF) 100 MCG/2ML IJ SOLN
INTRAMUSCULAR | Status: DC | PRN
Start: 2018-11-17 — End: 2018-11-17
  Administered 2018-11-17: 50 ug via INTRAVENOUS

## 2018-11-17 NOTE — Procedures (Signed)
[facility]enterology/Special Procedures    Patient Name: Brett Palmer  Date of Birth: June 06, 1966  Record Number: [id number]  Date of Procedure: 11/17/2018  Referring Physician:   Endoscopist: Amalia Greenhouse   Asst. Endoscopist:     Nurse: Charleen Kirks    PROCEDURE PERFORMED  EGD    INDICATIONS FOR EXAMINATION   Patient presents for an EGD for variceal screening in the setting of cirrhosis     Instruments:  746  Medications: Benadryl 25 mg IVP, Fentanyl 100 mcg IVP, Versed 5 mg IVP, Xylocaine viscous, gargle and swallow-2ml               The attending physician, Dr. Vida Roller, was present for the entire examination.  Procedure Technique: Patient's medications, allergies, past medical, surgical, social and family histories were reviewed and updated as appropriate. A discussion of informed consent was had with the patient and/or the patient's family prior to the   procedure, including sedation.  The alternatives, benefits and risks of the procedure including but not limited to pain, perforation, hemorrhage, infection, adverse drug reaction, missed lesion/incomplete procedure, tooth damage, and aspiration were   discussed    Description of Procedure:   Based on the pre-procedure assessment, including review of the patient's medical history, medications, allergies, and review of systems, the patient had been deemed to be an appropriate candidate for sedation; the patient was therefore sedated with the   medications listed. The patient was monitored continuously with pulse oximetry, blood pressure monitoring, and direct observations    The 746 was introduced and passed without difficulty to third part of duodenum. A careful inspection was made as the endoscope was withdrawn.  Findings and interventions are described below:  Extent of Exam: third part of duodenum.  Biopsy Obtained: No.  Complications: .   Estimated Blood Loss: None.   Need for Future Anesthesia: Anesthesiology for future procedures.   Total Procedure Time:       FINDINGS  1) No esophageal or gastric varices  2) Normal GE junction at 40cm  3) Mild PHG  4) Normal duodenum    ENDOSCOPIC DIAGNOSIS  1) No esophageal or gastric varices  2) Normal GE junction at 40cm  3) Mild PHG  4) Normal duodenum    RECOMMENDATIONS  1) Repeat EGD in 3 years    Signature:_________________________________ Amalia Greenhouse, MD              [phone])    Note:  The final official report is in the [facility] medical record.      This electronic signature authenticates all electronic and/or handwritten documentation, including orders, generated by the signer during the episode of care contained in this record.  11/17/2018 09:38:15 AM By Amalia Greenhouse MD

## 2018-11-17 NOTE — Discharge Instructions (Signed)
Discharge instructions and [employer] report given to patient.  Questions asked and answered.  All belongings with patient.

## 2018-11-17 NOTE — H&P (Signed)
History and Physical    Indication for procedure:  Patient presents for an EGD for variceal screening in the setting of cirrhosis      Pain Score: 0          Past Medical History:   Diagnosis Date   . Alcohol use    . Gastroesophageal reflux disease    . Hypertension    . Migraine headache     part of gulf war syndrome   . PTSD (post-traumatic stress disorder) 1995-present    treated by Dr. Joen Laura at [facility]    . SVT (supraventricular tachycardia) (CMS-HCC) 2011     Past Surgical History:   Procedure Laterality Date   . GALLBLADDER SURGERY  2011     Allergies   Allergen Reactions   . Compazine Anxiety   . Latex Rash     Prior to Admission Medications   Prescriptions Last Dose Informant Patient Reported? Taking?   ALPRAZolam (XANAX) 1 MG tablet 11/16/2018 at Unknown time  No Yes   Sig: Take 1 tablet (1 mg) by mouth 3 times daily as needed for Anxiety.   Esomeprazole Magnesium (NEXIUM PO) 11/16/2018 at Unknown time  Yes Yes   Sig: daily.   albuterol 108 (90 BASE) MCG/ACT inhaler   No No   Sig: Inhale 2 puffs by mouth every 6 hours as needed for Wheezing.   dicyclomine (BENTYL) 10 MG capsule   No No   Sig: Take 1 capsule (10 mg) by mouth 3 times daily (before meals) for 5 days.   fluticasone propionate (FLONASE) 50 MCG/ACT nasal spray   No No   Sig: Spray 2 sprays into each nostril daily.   hydrochlorothiazide (HYDRODIURIL) 12.5 MG tablet 11/16/2018 at Unknown time  No Yes   Sig: Take 1 tablet (12.5 mg) by mouth daily.   zolpidem (AMBIEN) 10 MG tablet   Yes No   Sig: Take 10 mg by mouth nightly as needed for Insomnia.      Facility-Administered Medications: None       BP 144/88   Pulse 98   Temp 98 F (36.7 C)   Resp 16   Ht 6' (1.829 m)   Wt 119.7 kg (264 lb)   SpO2 94%   BMI 35.80 kg/m   General: Well developed, well nourished, in no apparent distress.  Lungs: Clear breath sounds bilaterally.  CV: Normal rate, regular rhythm, no significant murmur present.  Abdomen: Soft, nontender, normal bowel sounds  present.    ASA Score:  3   Airway (Mallimpati) Score:  Class II - Soft palate, uvula, and fauces are visible.    Assessment and Plan  Proceed to planned procedure.    The patient has consented to the procedure, which will be done with sedation.  I have assessed the patient's status immediately prior to this procedure.  I have discussed pain management needs and options for the patient with the patient or caregiver.      The patient agrees to be full code for the duration of the procedure.    Sedation options, risks, and plans have been discussed with the patient or caregiver.  Questions were answered.  The patient or caregiver agrees to proceed as planned.    Brett Palmer

## 2018-11-29 ENCOUNTER — Encounter (INDEPENDENT_AMBULATORY_CARE_PROVIDER_SITE_OTHER): Payer: Self-pay | Admitting: Family Medicine

## 2018-11-29 DIAGNOSIS — G43919 Migraine, unspecified, intractable, without status migrainosus: Principal | ICD-10-CM

## 2018-12-02 NOTE — Telephone Encounter (Signed)
From: Jackalyn Lombard  To: [name], MD  Sent: 11/29/2018 11:52 PM PST  Subject: 20-Other    Dear Dr. Salvadore Dom,    I have been having migraines more and more. The only migraine medication that works is the imitrex injections. If you could please send me a new prescription for the imitrex injections with the applicator I would greatly appreciate it.    Regards,    Waymond Cera

## 2018-12-03 ENCOUNTER — Ambulatory Visit (HOSPITAL_BASED_OUTPATIENT_CLINIC_OR_DEPARTMENT_OTHER): Payer: Commercial Managed Care - HMO

## 2018-12-03 ENCOUNTER — Encounter (INDEPENDENT_AMBULATORY_CARE_PROVIDER_SITE_OTHER): Payer: Self-pay | Admitting: Family Medicine

## 2018-12-03 ENCOUNTER — Encounter (INDEPENDENT_AMBULATORY_CARE_PROVIDER_SITE_OTHER): Payer: Self-pay | Admitting: Gastroenterology

## 2018-12-03 MED ORDER — SUMATRIPTAN SUCCINATE 6 MG/0.5ML SC SOLN
6.0000 mg | Freq: Once | SUBCUTANEOUS | 0 refills | Status: DC
Start: 2018-12-03 — End: 2019-04-06

## 2018-12-03 NOTE — Telephone Encounter (Signed)
From: Brett Palmer  To: [name], MD  Sent: 12/03/2018 12:45 PM PST  Subject: 20-Other    Thank You, I have been asking the [facility] for refills, who for almost 20 years have being getting Imitrex from but now there give me a hard time. I will discuss more with my next apt with you, just being going to so many lately but will make one soon.  I hope you have a wonderful New Year,  Best regards  Brett Palmer  ----- Message -----  From: [name], MD  Sent: 12/03/2018 12:07 PM PST  To: Brett Palmer  Subject: RE: Dorathy Daft,  I sent a prescription to your pharmacy but if you are having frequent migraines, you should make an appointment to discuss prophylactic options. It's been a long time since we've seen each other and I think it would be a good idea.  Take care,  [name]    ----- Message -----   From: Brett Palmer   Sent: 11/29/2018 11:52 PM PST   To: [name], MD  Subject: 20-Other    Dear Dr. Salvadore Dom,    I have been having migraines more and more. The only migraine medication that works is the imitrex injections. If you could please send me a new prescription for the imitrex injections with the applicator I would greatly appreciate it.    Regards,    Brett Palmer

## 2018-12-17 NOTE — Telephone Encounter (Signed)
error 

## 2018-12-23 ENCOUNTER — Encounter (INDEPENDENT_AMBULATORY_CARE_PROVIDER_SITE_OTHER): Payer: Commercial Managed Care - HMO | Admitting: Nurse Practitioner

## 2019-01-24 ENCOUNTER — Ambulatory Visit (HOSPITAL_BASED_OUTPATIENT_CLINIC_OR_DEPARTMENT_OTHER): Payer: Commercial Managed Care - HMO

## 2019-01-26 ENCOUNTER — Ambulatory Visit (HOSPITAL_BASED_OUTPATIENT_CLINIC_OR_DEPARTMENT_OTHER): Payer: Commercial Managed Care - HMO | Admitting: Gastroenterology

## 2019-04-04 ENCOUNTER — Emergency Department
Admission: EM | Admit: 2019-04-04 | Discharge: 2019-04-04 | Disposition: A | Discharge status: Home / Self Care | Payer: Commercial Managed Care - HMO | Attending: Emergency Medicine | Admitting: Emergency Medicine

## 2019-04-04 ENCOUNTER — Emergency Department (EMERGENCY_DEPARTMENT_HOSPITAL): Payer: Commercial Managed Care - HMO

## 2019-04-04 ENCOUNTER — Encounter (INDEPENDENT_AMBULATORY_CARE_PROVIDER_SITE_OTHER): Payer: Self-pay | Admitting: Family Medicine

## 2019-04-04 DIAGNOSIS — Z888 Allergy status to other drugs, medicaments and biological substances status: Secondary | ICD-10-CM | POA: Insufficient documentation

## 2019-04-04 DIAGNOSIS — I471 Supraventricular tachycardia: Secondary | ICD-10-CM | POA: Insufficient documentation

## 2019-04-04 DIAGNOSIS — Z8249 Family history of ischemic heart disease and other diseases of the circulatory system: Secondary | ICD-10-CM | POA: Insufficient documentation

## 2019-04-04 DIAGNOSIS — I10 Essential (primary) hypertension: Secondary | ICD-10-CM | POA: Insufficient documentation

## 2019-04-04 DIAGNOSIS — R1110 Vomiting, unspecified: Secondary | ICD-10-CM

## 2019-04-04 DIAGNOSIS — Z87891 Personal history of nicotine dependence: Secondary | ICD-10-CM | POA: Insufficient documentation

## 2019-04-04 DIAGNOSIS — R509 Fever, unspecified: Secondary | ICD-10-CM

## 2019-04-04 DIAGNOSIS — Z7951 Long term (current) use of inhaled steroids: Secondary | ICD-10-CM | POA: Insufficient documentation

## 2019-04-04 DIAGNOSIS — Z808 Family history of malignant neoplasm of other organs or systems: Secondary | ICD-10-CM | POA: Insufficient documentation

## 2019-04-04 DIAGNOSIS — R197 Diarrhea, unspecified: Secondary | ICD-10-CM

## 2019-04-04 DIAGNOSIS — Z1159 Encounter for screening for other viral diseases: Secondary | ICD-10-CM | POA: Insufficient documentation

## 2019-04-04 DIAGNOSIS — F10230 Alcohol dependence with withdrawal, uncomplicated: Secondary | ICD-10-CM | POA: Insufficient documentation

## 2019-04-04 DIAGNOSIS — F4310 Post-traumatic stress disorder, unspecified: Secondary | ICD-10-CM | POA: Insufficient documentation

## 2019-04-04 DIAGNOSIS — F1093 Alcohol use, unspecified with withdrawal, uncomplicated (CMS-HCC): Secondary | ICD-10-CM

## 2019-04-04 DIAGNOSIS — Z9049 Acquired absence of other specified parts of digestive tract: Secondary | ICD-10-CM | POA: Insufficient documentation

## 2019-04-04 DIAGNOSIS — K219 Gastro-esophageal reflux disease without esophagitis: Secondary | ICD-10-CM | POA: Insufficient documentation

## 2019-04-04 DIAGNOSIS — Z885 Allergy status to narcotic agent status: Secondary | ICD-10-CM | POA: Insufficient documentation

## 2019-04-04 DIAGNOSIS — R1031 Right lower quadrant pain: Secondary | ICD-10-CM

## 2019-04-04 DIAGNOSIS — R932 Abnormal findings on diagnostic imaging of liver and biliary tract: Secondary | ICD-10-CM

## 2019-04-04 DIAGNOSIS — Z79899 Other long term (current) drug therapy: Secondary | ICD-10-CM | POA: Insufficient documentation

## 2019-04-04 DIAGNOSIS — R160 Hepatomegaly, not elsewhere classified: Secondary | ICD-10-CM

## 2019-04-04 DIAGNOSIS — R0902 Hypoxemia: Secondary | ICD-10-CM

## 2019-04-04 LAB — LIVER PANEL, BLOOD
ALT (SGPT): 52 U/L — ABNORMAL HIGH (ref 0–41)
AST (SGOT): 122 U/L — ABNORMAL HIGH (ref 0–40)
Albumin: 4.7 g/dL (ref 3.5–5.2)
Alkaline Phos: 97 U/L (ref 40–129)
Bilirubin, Dir: 0.6 mg/dL — ABNORMAL HIGH (ref <0.2)
Bilirubin, Tot: 1.82 mg/dL — ABNORMAL HIGH (ref <1.2)
Total Protein: 8 g/dL (ref 6.0–8.0)

## 2019-04-04 LAB — CBC WITH DIFF, BLOOD
ANC-Automated: 4.3 10*3/uL (ref 1.6–7.0)
Abs Basophils: 0 10*3/uL (ref <0.1)
Abs Eosinophils: 0 10*3/uL (ref 0.1–0.5)
Abs Lymphs: 1 10*3/uL (ref 0.8–3.1)
Abs Monos: 0.4 10*3/uL (ref 0.2–0.8)
Basophils: 0 %
Eosinophils: 0 %
Hct: 46.7 % (ref 40.0–50.0)
Hgb: 15.6 gm/dL (ref 13.7–17.5)
Lymphocytes: 17 %
MCH: 30.4 pg (ref 26.0–32.0)
MCHC: 33.4 g/dL (ref 32.0–36.0)
MCV: 91 um3 (ref 79.0–95.0)
MPV: 9.7 fL (ref 9.4–12.4)
Monocytes: 8 %
Plt Count: 100 10*3/uL — ABNORMAL LOW (ref 140–370)
RBC: 5.13 10*6/uL (ref 4.60–6.10)
RDW: 14.7 % — ABNORMAL HIGH (ref 12.0–14.0)
Segs: 74 %
WBC: 5.8 10*3/uL (ref 4.0–10.0)

## 2019-04-04 LAB — URINALYSIS WITH CULTURE REFLEX, WHEN INDICATED
Bilirubin: NEGATIVE
Blood: NEGATIVE
Glucose: NEGATIVE
Hyaline Cast: >5 — AB (ref 0–?)
Leuk Esterase: NEGATIVE
Nitrite: NEGATIVE
Protein: NEGATIVE
Specific Gravity: 1.028 (ref 1.002–1.030)
Urobilinogen: NEGATIVE
pH: 6 (ref 5.0–8.0)

## 2019-04-04 LAB — BASIC METABOLIC PANEL, BLOOD
Anion Gap: 19 mmol/L — ABNORMAL HIGH (ref 7–15)
Anion Gap: 14 mmol/L (ref 7–15)
BUN: 7 mg/dL (ref 6–20)
BUN: 6 mg/dL (ref 6–20)
Bicarbonate: 20 mmol/L — ABNORMAL LOW (ref 22–29)
Bicarbonate: 23 mmol/L (ref 22–29)
Calcium: 9.5 mg/dL (ref 8.5–10.6)
Calcium: 9 mg/dL (ref 8.5–10.6)
Chloride: 98 mmol/L (ref 98–107)
Chloride: 101 mmol/L (ref 98–107)
Creatinine: 0.86 mg/dL (ref 0.67–1.17)
Creatinine: 0.77 mg/dL (ref 0.67–1.17)
GFR: >60 mL/min
GFR: >60 mL/min
Glucose: 135 mg/dL — ABNORMAL HIGH (ref 70–99)
Glucose: 119 mg/dL — ABNORMAL HIGH (ref 70–99)
Potassium: 4.3 mmol/L (ref 3.5–5.1)
Potassium: 4.2 mmol/L (ref 3.5–5.1)
Sodium: 137 mmol/L (ref 136–145)
Sodium: 138 mmol/L (ref 136–145)

## 2019-04-04 LAB — RBC MORPHOLOGY
Plt Est: DECREASED
RBC Comment: NORMAL

## 2019-04-04 LAB — PROTHROMBIN TIME, BLOOD
INR: 1.3
PT,Patient: 13.7 s — ABNORMAL HIGH (ref 9.7–12.5)

## 2019-04-04 LAB — COVID-19 RAPID NAAT (POCT): COVID-19 Rapid Assay (POCT): NOT DETECTED

## 2019-04-04 LAB — PHOSPHORUS, BLOOD: Phosphorous: 2.4 mg/dL — ABNORMAL LOW (ref 2.7–4.5)

## 2019-04-04 LAB — HIV 1/2 ANTIBODY & P24 ANTIGEN ASSAY, BLOOD

## 2019-04-04 LAB — LACTATE, BLOOD
Lactate: 2.7 mmol/L — ABNORMAL HIGH (ref 0.5–2.0)
Lactate: 1.8 mmol/L (ref 0.5–2.0)

## 2019-04-04 LAB — LIPASE, BLOOD: Lipase: 23 U/L (ref 13–60)

## 2019-04-04 LAB — MAGNESIUM, BLOOD: Magnesium: 2.4 mg/dL (ref 1.6–2.6)

## 2019-04-04 MED ORDER — MULTI-VITAMINS PO TABS
1.0000 | ORAL_TABLET | Freq: Every day | ORAL | Status: AC
Start: 2019-04-04 — End: 2019-04-04
  Administered 2019-04-04: 15:00:00 1 via ORAL
  Filled 2019-04-04: qty 1

## 2019-04-04 MED ORDER — VITAMIN B-1 100 MG OR TABS
100.0000 mg | ORAL_TABLET | Freq: Every day | ORAL | Status: AC
Start: 2019-04-04 — End: 2019-04-04
  Administered 2019-04-04: 100 mg via ORAL
  Filled 2019-04-04: qty 1

## 2019-04-04 MED ORDER — MORPHINE SULFATE 4 MG/ML IJ SOLN
8.0000 mg | Freq: Once | INTRAMUSCULAR | Status: DC
Start: 2019-04-04 — End: 2019-04-04

## 2019-04-04 MED ORDER — LORAZEPAM 2 MG/ML IJ SOLN
1.0000 mg | Freq: Once | INTRAMUSCULAR | Status: AC
Start: 2019-04-04 — End: 2019-04-04
  Administered 2019-04-04 (×2): 1 mg via INTRAVENOUS
  Filled 2019-04-04: qty 1

## 2019-04-04 MED ORDER — CHLORDIAZEPOXIDE HCL 25 MG OR CAPS
25.0000 mg | ORAL_CAPSULE | Freq: Four times a day (QID) | ORAL | 0 refills | Status: DC
Start: 2019-04-04 — End: 2019-04-10

## 2019-04-04 MED ORDER — IOHEXOL 350 MG/ML IV SOLN
100.00 mL | Freq: Once | INTRAVENOUS | Status: AC
Start: 2019-04-04 — End: 2019-04-04
  Administered 2019-04-04: 12:00:00 100 mL via INTRAVENOUS
  Filled 2019-04-04: qty 100

## 2019-04-04 MED ORDER — SODIUM CHLORIDE 0.9 % IV SOLN
Freq: Once | INTRAVENOUS | Status: AC
Start: 2019-04-04 — End: 2019-04-04
  Administered 2019-04-04: 14:00:00 via INTRAVENOUS

## 2019-04-04 MED ORDER — PROMETHAZINE HCL 25 MG/ML IJ SOLN
25.00 mg | Freq: Once | INTRAMUSCULAR | Status: AC
Start: 2019-04-04 — End: 2019-04-04
  Administered 2019-04-04: 25 mg via INTRAVENOUS
  Filled 2019-04-04: qty 1

## 2019-04-04 MED ORDER — LORAZEPAM 2 MG/ML IJ SOLN
1.0000 mg | INTRAMUSCULAR | Status: DC | PRN
Start: 2019-04-04 — End: 2019-04-04

## 2019-04-04 MED ORDER — LACTATED RINGERS IV SOLN
Freq: Once | INTRAVENOUS | Status: AC
Start: 2019-04-04 — End: 2019-04-04
  Administered 2019-04-04: 10:00:00 via INTRAVENOUS

## 2019-04-04 MED ORDER — PHENOBARBITAL SODIUM 130 MG/ML IJ SOLN
260.00 mg | Freq: Once | INTRAMUSCULAR | Status: DC
Start: 2019-04-04 — End: 2019-04-04

## 2019-04-04 MED ORDER — LORAZEPAM 2 MG/ML IJ SOLN
2.0000 mg | Freq: Once | INTRAMUSCULAR | Status: AC
Start: 2019-04-04 — End: 2019-04-04
  Administered 2019-04-04: 2 mg via INTRAVENOUS
  Filled 2019-04-04: qty 1

## 2019-04-04 NOTE — ED MD Progress Note (Signed)
Labs  Results for orders placed or performed during the hospital encounter of 37/90/24   Basic Metabolic Panel, Blood Green Plasma Separator Tube   Result Value Ref Range    Glucose 135 (H) 70 - 99 mg/dL    BUN 7 6 - 20 mg/dL    Creatinine 0.86 0.67 - 1.17 mg/dL    GFR >60 mL/min    Sodium 137 136 - 145 mmol/L    Potassium 4.3 3.5 - 5.1 mmol/L    Chloride 98 98 - 107 mmol/L    Bicarbonate 20 (L) 22 - 29 mmol/L    Anion Gap 19 (H) 7 - 15 mmol/L    Calcium 9.5 8.5 - 10.6 mg/dL   HIV 1/2 Antibody & P24 Antigen Assay Yellow serum separator tube   Result Value Ref Range    HIV 1/2 Antibody & P24 Antigen Assay Sent for confirmatory testing    Liver Panel, Blood Green Plasma Separator Tube   Result Value Ref Range    Total Protein 8.0 6.0 - 8.0 g/dL    Albumin 4.7 3.5 - 5.2 g/dL    Bilirubin, Dir 0.6 (H) <0.2 mg/dL    Bilirubin, Tot 1.82 (H) <1.2 mg/dL    AST (SGOT) 122 (H) 0 - 40 U/L    ALT (SGPT) 52 (H) 0 - 41 U/L    Alkaline Phos 97 40 - 129 U/L   Lipase, Blood Green Plasma Separator Tube   Result Value Ref Range    Lipase 23 13 - 60 U/L   Lactate, Blood - See Instructions   Result Value Ref Range    Lactate 2.7 (H) 0.5 - 2.0 mmol/L   Lactate, Blood - See Instructions   Result Value Ref Range    Lactate 1.8 0.5 - 2.0 mmol/L   CBC w/ Diff Lavender   Result Value Ref Range    WBC 5.8 4.0 - 10.0 1000/mm3    RBC 5.13 4.60 - 6.10 mill/mm3    Hgb 15.6 13.7 - 17.5 gm/dL    Hct 46.7 40.0 - 50.0 %    MCV 91.0 79.0 - 95.0 um3    MCH 30.4 26.0 - 32.0 pgm    MCHC 33.4 32.0 - 36.0 g/dL    RDW 14.7 (H) 12.0 - 14.0 %    MPV 9.7 9.4 - 12.4 fL    Plt Count 100 (L) 140 - 370 1000/mm3    Segs 74 %    Lymphocytes 17 %    Monocytes 8 %    Eosinophils 0 %    Basophils 0 %    ANC-Automated 4.3 1.6 - 7.0 1000/mm3    Abs Lymphs 1.0 0.8 - 3.1 1000/mm3    Abs Monos 0.4 0.2 - 0.8 1000/mm3    Abs Eosinophils 0.0 <0.1 - 0.5 1000/mm3    Abs Basophils 0.0 <0.1 1000/mm3    Diff Type Automated    Prothrombin Time, Blood Blue   Result Value Ref Range    PT,Patient 13.7 (H) 9.7 - 12.5 sec    INR 1.3    Urinalysis with Culture Reflex, when indicated   Result Value Ref Range    Type Not Specified     Color Amber Yellow    Appearance Slightly Hazy Clear    Specific Gravity 1.028 1.002 - 1.030    pH 6.0 5.0 - 8.0    Protein Negative Negative    Glucose Negative Negative    Ketones Trace (A) Negative    Bilirubin Negative Negative  Blood Negative Negative    Urobilinogen Negative Negative    Nitrite Negative Negative    Leuk Esterase Negative Negative    WBC 0-2 0-2/HPF    RBC 0-2 0-2/HPF    Bacteria Rare None-Rare/HPF    Squam. Epithelial Cell 0-5(RARE) <6-10(FEW)    Mucus Rare None-Rare/HPF    Hyaline Cast >5 (A) 2-8/ZMO   Basic Metabolic Panel, Blood Green Plasma Separator Tube   Result Value Ref Range    Glucose 119 (H) 70 - 99 mg/dL    BUN 6 6 - 20 mg/dL    Creatinine 0.77 0.67 - 1.17 mg/dL    GFR >60 mL/min    Sodium 138 136 - 145 mmol/L    Potassium 4.2 3.5 - 5.1 mmol/L    Chloride 101 98 - 107 mmol/L    Bicarbonate 23 22 - 29 mmol/L    Anion Gap 14 7 - 15 mmol/L    Calcium 9.0 8.5 - 10.6 mg/dL   RBC Morphology   Result Value Ref Range    Plt Est Decreased     RBC Comment Normal     Macrocytic 1+     Polychromasia 1+    Phosphorus, Blood   Result Value Ref Range    Phosphorous 2.4 (L) 2.7 - 4.5 mg/dL   Magnesium, Blood   Result Value Ref Range    Magnesium 2.4 1.6 - 2.6 mg/dL   Rapid COVID-19 Assay   Result Value Ref Range    COVID-19 Source Nasal     COVID-19 Rapid Assay Not Detected        Diagnostic Studies  X-Ray Chest Single View   Final Result      CT Abdomen And Pelvis With Contrast   Final Result   IMPRESSION:   CT scan of the abdomen and pelvis with IV contrast.      1. postsurgical changes of appendectomy; no evidence of intra-abdominal/pelvic abscesses or diverticulitis.      2. Increased right ileocolic mesentery stranding, most likely postoperative changes.      3. Hepatomegaly with findings suggestive of mild steatosis. No imaging features of  advanced cirrhosis.             EKG Interpretation  See attending EKG note    ED Course & Medical Decision Making    Latest vital signs:   Vitals:    04/04/19 0841 04/04/19 1025 04/04/19 1355 04/04/19 1533   BP: 113/76 156/81 151/86 135/85   BP Location:  Left arm     BP Patient Position:  Semi-Fowlers     Pulse: (!) 124 (!) 120 107 101   Resp: 20 20 17 16    Temp: 98.7 F (37.1 C)   98 F (36.7 C)   SpO2: 94% 92% 92% 96%   Weight: 119.1 kg (262 lb 8 oz)      Height: 6' (1.829 m)        CT abdomen was unremarkable for acute surgical process.  Patient's pain improved significantly with morphine and Phenergan.      - patient's labs or nursing remarkable for elevated lactate at 2.7, anion gap of 19 with bicarb of 20, creatinine was normal.  Patient did have elevated transaminase consistent with ETOH abuse, he does endorse being a daily drinker and has not drank in the last 3 days.  Suspecting the patient's hypertension and tachycardia initially were likely due to alcohol withdrawal.  Patient was placed on CIWA protocol and given total of 3 mg of  Ativan with only mild improvement in his tremors, heart rate did improve to low 100s.  Discussed with patient the benefits of admission for alcohol withdrawal at this time; patient declined admission stating that he has a young grandson that he has to take care of at home and that he was feeling better.  Patient understood the risks of not being admitted and the risks of clinical deterioration.  He opted for a Librium taper and close PCP follow-up.  Patient's repeat blood work after total of 3 L showed resolved anion gap, and lactate of 1.8.    Patient was tolerating PO and ambulating normally per baseline. Remained hemodynamically stable throughout ED stay. Strict eturn precautions were discussed, patient expressed understanding and was amenable to close follow-up plan.

## 2019-04-04 NOTE — ED EKG Interpretation (Signed)
ED EKG Interpretation    EKG: Sinus Tachycardia with Normal Axis and t wave inversions, seen on prior ECGs.

## 2019-04-04 NOTE — Discharge Instructions (Signed)
You have been prescribed a LIBRIUM TAPER for your withdrawal. Please take 25mg  every 6 hours for the next 3 days.       You are recommended to follow-up with your primary care doctor within 2-3 days for a re-evaluation of your symptoms. Please continue taking your home medications as prescribed by your doctor    Please return to the emergency department if you experience worsening pain, fevers, worsening nausea and/or vomiting, loss of consciousness and/or difficulty breathing, or for any other concerns you may have about your health.     Thank you for your visit and we hope you feel better.

## 2019-04-04 NOTE — ED Notes (Signed)
1117 - 12lead ECG done and given to Dr. Theola Sequin. Previous ECG from [facility] also provided.

## 2019-04-04 NOTE — ED Notes (Signed)
Patient is alert and oriented x 4, vital signs stable, discharge instructions given, verbalizes understanding of follow up and when to return to ER. Patient's belongings with pt at this time.

## 2019-04-04 NOTE — ED Provider Notes (Signed)
Emergency Department Provider Note    Patient: Brett Palmer, MRN [id number], DOB [birth year]  The Date of Service for the Emergency Room encounter is 04/04/2019  9:34 AM   Chief Complaint   Patient presents with   . Abdominal Pain     Pt reports nausea and vomiting. Unable to tolerate po intake. R sided sharp abd pain. Diarrhea. Recent appy 4 weeks ago in [place].       HPI: Brett Palmer is a 53 year old male with PMHx as below including SVT, hx of appendicitis 1 month ago s/p lap in [place], discharged about 3 weeks ago and had a course of antibiotics at that time. Has been having intermittent persistent abdominal pains and fevers since then, mostly in the surgical sites. States fevers, abdominal pain and watery diarrhea has been persistent for the last 3 days. Also having nausea and vomiting for the last 3 days. Fever highest was 100.47F. Feels like his heart rate is elevated as well. Stools are nonbloody.     No other medical complaints, see additional ROS below.     HEENT: Denies changes in vision/sore throat/ear pain  Neck: Denies neck pain  Resp: Denies SOB/dyspnea/cough  Cardiac: Denies chest pain  GU: Denies hematuria/flank pain/dysuria  Extremities: Denies swelling  Skin: Denies new rash or wound  Neuro: Denies headache/numbness/tingling    ROS:  All other systems reviewed and negative unless otherwise noted in the HPI or above. This was done per my custom and practice for systems appropriate to the chief complaint in an emergency department setting and varies depending on the quality of history that the patient is able to provide.    Patient's medical history has been reviewed today as available in EPIC chart.  Primary MD: [name]    Home Medications:  Prior to Admission Medications   Prescriptions Last Dose Informant Patient Reported? Taking?   ALPRAZolam (XANAX) 1 MG tablet   No No   Sig: Take 1 tablet (1 mg) by mouth 3 times daily as needed for Anxiety.   Esomeprazole Magnesium (NEXIUM PO)    Yes No   Sig: daily.   SUMAtriptan (IMITREX) 6 MG/0.5ML SOLN subcutaneous injection   No No   Sig: Inject 0.5 mL (6 mg) under the skin once for 1 dose.   albuterol 108 (90 BASE) MCG/ACT inhaler   No No   Sig: Inhale 2 puffs by mouth every 6 hours as needed for Wheezing.   dicyclomine (BENTYL) 10 MG capsule   No No   Sig: Take 1 capsule (10 mg) by mouth 3 times daily (before meals) for 5 days.   fluticasone propionate (FLONASE) 50 MCG/ACT nasal spray   No No   Sig: Spray 2 sprays into each nostril daily.   hydrochlorothiazide (HYDRODIURIL) 12.5 MG tablet   No No   Sig: Take 1 tablet (12.5 mg) by mouth daily.   zolpidem (AMBIEN) 10 MG tablet   Yes No   Sig: Take 10 mg by mouth nightly as needed for Insomnia.      Facility-Administered Medications: None       Allergies: Compazine; Latex; and Vicodin [apap-fd&c yellow #10 [place]-hydrocodone]    Past Medical/Surgical History: see HPI   Past Medical History:   Diagnosis Date   . Alcohol use    . Gastroesophageal reflux disease    . Hypertension    . Migraine headache     part of gulf war syndrome   . PTSD (post-traumatic stress  disorder) 1995-present    treated by [facility] Joen Laura at [facility]    . SVT (supraventricular tachycardia) (CMS-HCC) 2011     Past Surgical History:   Procedure Laterality Date   . GALLBLADDER SURGERY  2011       Family History:   Family History   Problem Relation Name Age of Onset   . Cancer Father          melanoma, died at age 80   . Heart Disease Mother          CHF, thought to have passed from MI   . Heart Disease Maternal Aunt          CHF       Social History:  Housed   Social History     Tobacco Use   . Smoking status: Former Smoker     Packs/day: 0.50     Years: 10.00     Pack years: 5.00     Types: Cigarettes     Last attempt to quit: 01/11/1990     Years since quitting: 29.2   . Smokeless tobacco: Never Used   Substance Use Topics   . Alcohol use: Yes     Comment: down to 1/2-3/4 bottle of wine   . Drug use: No     Comment: Denies MJ use              Physical Exam  Vitals:    04/04/19 0841   BP: 113/76   Pulse: (!) 124   Resp: 20   Temp: 98.7 F (37.1 C)   SpO2: 94%   Weight: 119.1 kg (262 lb 8 oz)   Height: 6' (1.829 m)     Vital signs reviewed and noted: afebrile, tachycardic  Gen: Patient is anxious-appearing, well-groomed, alert, non-toxic appearing, coopertive  HEENT: NC/AT. PERRL. EOM intact. No facial asymmetry noted. No scleral icterus, clear conjunctiva, no conjunctival palor. No rhinorrhea. +dry mucus membranes, symmetric palatal rise, pharynx non-exudative, uvula is midline, non-erythematous. No appreciable tongue fasciculations.   Neck: Supple. FROM. No anterior or posterior cervical LAD. No meningeal signs.  Pulm: CTA anteriorly and posteriorly. Symmetric air entry bilaterally. No wheezes/rales/rhonchi. Good inspiratory effort. No stridor.   Cardiac: S1S2 RRR. No murmurs/gallops/rubs.   Abd: Soft, +TTP of the RLQ and suprapubic area and around umbilicus, nondistended. No guarding/rebound.    Extr/MSK: Warm, well perfused, distal pulses 2+ upper and lower extremities, symmetric. No edema. No calf tenderness. FROM.  Skin: Warm, dry, no appreciable cyanosis.    Neuro: Awake, alert, and oriented to person place and time. CN II-XII grossly intact. No dysarthria. Strength 5/5 upper and lower extremities, Sensation symmetric to light touch on CN V and upper/lower extremities bilaterally. Ambulates with steady gait.  Psych: Appropriate but appears anxious    Labs  Ordered and pending    Diagnostic Studies  CT Abdomen And Pelvis With Contrast    (Results Pending)       EKG Interpretation  Ordered and pending    Assessment & Plan  Pt is a 53 year old y/o male with history as above who presents with abdominal pain and fevers over the last 3 days, that have been intermittent over the last month since his appendectomy that was done in [place].  Also having watery diarrhea after course of antibiotics that he completed about 3 weeks ago.  On exam, patient  was tachycardic but afebrile otherwise hemodynamically stable.  He did appear to have dry mucous membranes consistent with  his history of vomiting for the last 3 days.  Abdomen was nondistended and soft, he did have mild tenderness to palpation around the right lower quadrant, umbilicus area and suprapubic area near his surgical sites.  No drainage noted, no signs of cellulitis.    Differential includes diverticulitis versus possible intra-abdominal abscess versus seroma versus infectious diarrhea status post antibiotics.  Will plan for tried fluids and reassessed patient's tachycardia, will plan for EKG.    Patient is requesting Dilaudid and Phenergan. Discussed with patient that will trial appropriate dose of morphine first.     Plan:   - labs, covid test, UA, GI path panel  - 2L fluid hydration  - phenergan, then pain control  - CT abdomen/pelvis   - ECG  - NPO for now, reassess    Dispo: pending above    Lab results were interpreted and discussed with ED attending. Full ED course and patient progress may be reported in subsequent progress notes. Please review.     Orders Placed This Encounter   Procedures   . CT Abdomen And Pelvis With Contrast   . Basic Metabolic Panel, Blood Green Plasma Separator Tube   . HIV 1/2 Antibody & P24 Antigen Assay Yellow serum separator tube   . Liver Panel, Blood Green Plasma Separator Tube   . Lipase, Blood Green Plasma Separator Tube   . Lactate, Blood - See Instructions   . CBC w/ Diff Lavender   . Prothrombin Time, Blood Blue   . Urinalysis with Culture Reflex, when indicated   . GI Pathogen Nucleic Acid Detection Test   . ECG 12 Lead       This note was written with voice-dictation software that may result in misinterpretations and typographical errors.     Patient seen and discussed with ED attending, Ova Freshwater, MD.   Marjory Lies, MD  Emergency Medicine PGY-3       Marjory Lies, MD  Resident  04/04/19 1006       Ova Freshwater, MD  04/04/19 [phone]

## 2019-04-04 NOTE — Telephone Encounter (Signed)
From: [name]  To: [name], MD  Sent: 04/04/2019 2:33 PM PDT  Subject: [name] Afternoon Dr. Salvadore Dom,    I am currently in the [facility] emergency department. You can see on my chart what I am being treated for.    They are giving me librium to help my condition and I would like to see you in the office to help me continue this treatment. I have requested an appointment but am sending this email to hopefully get in sooner than later.    Regards,    Brett Palmer

## 2019-04-05 ENCOUNTER — Encounter (INDEPENDENT_AMBULATORY_CARE_PROVIDER_SITE_OTHER): Payer: Self-pay | Admitting: Family Medicine

## 2019-04-05 ENCOUNTER — Telehealth (INDEPENDENT_AMBULATORY_CARE_PROVIDER_SITE_OTHER): Payer: Commercial Managed Care - HMO | Admitting: Family Medicine

## 2019-04-05 ENCOUNTER — Telehealth (INDEPENDENT_AMBULATORY_CARE_PROVIDER_SITE_OTHER): Payer: Self-pay | Admitting: Internal Medicine

## 2019-04-05 DIAGNOSIS — R002 Palpitations: Secondary | ICD-10-CM | POA: Diagnosis present

## 2019-04-05 DIAGNOSIS — I10 Essential (primary) hypertension: Secondary | ICD-10-CM | POA: Diagnosis present

## 2019-04-05 DIAGNOSIS — G43909 Migraine, unspecified, not intractable, without status migrainosus: Secondary | ICD-10-CM | POA: Diagnosis present

## 2019-04-05 DIAGNOSIS — R197 Diarrhea, unspecified: Secondary | ICD-10-CM | POA: Diagnosis present

## 2019-04-05 DIAGNOSIS — R9431 Abnormal electrocardiogram [ECG] [EKG]: Secondary | ICD-10-CM | POA: Diagnosis not present

## 2019-04-05 DIAGNOSIS — F10239 Alcohol dependence with withdrawal, unspecified: Principal | ICD-10-CM | POA: Diagnosis present

## 2019-04-05 DIAGNOSIS — Z723 Lack of physical exercise: Secondary | ICD-10-CM

## 2019-04-05 DIAGNOSIS — R569 Unspecified convulsions: Secondary | ICD-10-CM | POA: Diagnosis present

## 2019-04-05 DIAGNOSIS — Z7951 Long term (current) use of inhaled steroids: Secondary | ICD-10-CM

## 2019-04-05 DIAGNOSIS — Z808 Family history of malignant neoplasm of other organs or systems: Secondary | ICD-10-CM

## 2019-04-05 DIAGNOSIS — K921 Melena: Secondary | ICD-10-CM | POA: Diagnosis present

## 2019-04-05 DIAGNOSIS — E669 Obesity, unspecified: Secondary | ICD-10-CM | POA: Diagnosis present

## 2019-04-05 DIAGNOSIS — Z8249 Family history of ischemic heart disease and other diseases of the circulatory system: Secondary | ICD-10-CM

## 2019-04-05 DIAGNOSIS — G4700 Insomnia, unspecified: Secondary | ICD-10-CM | POA: Diagnosis present

## 2019-04-05 DIAGNOSIS — Q430 Meckel's diverticulum (displaced) (hypertrophic): Secondary | ICD-10-CM

## 2019-04-05 DIAGNOSIS — K766 Portal hypertension: Secondary | ICD-10-CM | POA: Diagnosis present

## 2019-04-05 DIAGNOSIS — F10939 Alcohol use, unspecified with withdrawal, unspecified (CMS-HCC): Secondary | ICD-10-CM

## 2019-04-05 DIAGNOSIS — Z87891 Personal history of nicotine dependence: Secondary | ICD-10-CM

## 2019-04-05 DIAGNOSIS — F4310 Post-traumatic stress disorder, unspecified: Secondary | ICD-10-CM | POA: Diagnosis present

## 2019-04-05 DIAGNOSIS — K7030 Alcoholic cirrhosis of liver without ascites: Secondary | ICD-10-CM | POA: Diagnosis present

## 2019-04-05 DIAGNOSIS — F1320 Sedative, hypnotic or anxiolytic dependence, uncomplicated: Secondary | ICD-10-CM | POA: Diagnosis present

## 2019-04-05 DIAGNOSIS — R791 Abnormal coagulation profile: Secondary | ICD-10-CM | POA: Diagnosis present

## 2019-04-05 DIAGNOSIS — F329 Major depressive disorder, single episode, unspecified: Secondary | ICD-10-CM | POA: Diagnosis present

## 2019-04-05 DIAGNOSIS — Z86010 Personal history of colonic polyps: Secondary | ICD-10-CM

## 2019-04-05 DIAGNOSIS — K219 Gastro-esophageal reflux disease without esophagitis: Secondary | ICD-10-CM | POA: Diagnosis present

## 2019-04-05 DIAGNOSIS — Z6836 Body mass index (BMI) 36.0-36.9, adult: Secondary | ICD-10-CM

## 2019-04-05 DIAGNOSIS — K3189 Other diseases of stomach and duodenum: Secondary | ICD-10-CM | POA: Diagnosis present

## 2019-04-05 DIAGNOSIS — Z79899 Other long term (current) drug therapy: Secondary | ICD-10-CM

## 2019-04-05 DIAGNOSIS — D696 Thrombocytopenia, unspecified: Secondary | ICD-10-CM | POA: Diagnosis present

## 2019-04-05 LAB — HIV 1/2 CONFIRMATORY SUPPLEMENTAL ASSAY
HIV-1 Supplemental Confirmatory Test: NEGATIVE
HIV-2 Supplemental Confirmatory Test: NEGATIVE

## 2019-04-05 LAB — HIV-1 RNA QUANT/PLASMA: HIV-1 RNA Ultra Quant, Plasma: NOT DETECTED copies/mL

## 2019-04-05 MED ORDER — CHLORDIAZEPOXIDE HCL 25 MG OR CAPS
25.0000 mg | ORAL_CAPSULE | Freq: Three times a day (TID) | ORAL | 0 refills | Status: DC | PRN
Start: 2019-04-05 — End: 2019-04-10

## 2019-04-05 NOTE — Patient Instructions (Addendum)
50 mg q 8 hours for 3 doses today  Tomorrow - start 25 mg q 6hours

## 2019-04-05 NOTE — Telephone Encounter (Signed)
FAMILY MEDICINE ON CALL TELEPHONE TRIAGE:    DATE: 04/05/2019  TIME: 9:40 PM    Name of PCP Provider: [name]    PATIENT NAME: Brett Palmer CALL/CC: Concern for Etoh withdrawal    -Patient recently in the ED for withdrawal 04/04/19 and was going to be admitted however he declined admission given schedule conflicts  -Today he spoke with his PCP about Librium dosage as he felt it was not effective. CIWA 6  -PCP per chart willing to increase librium to 50mg  TID x1day, then 25mg  q6hrs given patient report of shaking at night and possible seizures  -However patient continued to take librium 25mg  q6hrs with last dosage around 3-4pm today  -Since that time he continues to have anxiety, shakiness, sweating, fogginess, word spelling difficulty. Denies falling or losing consciousness.   -upcoming trip to [place] but has changed trip details so that he can be admitted to the hospital.   -states he is scared to go to sleep due to fear of having a seizure, states he had shaking in his legs last night   -last drink was 04/03/19 afternoon  -nausea but denies vomiting, ate some vegetables, fruit, steak, and has been drank at least 1.5L of water today  -states SOB and dizzy ambulating steps, chest tightness but denies symptoms currently  -[employer] application heart rate [id number], pulse ox 86-91, BP unable to take it (battery died), T 98.6, these vitals were taken 930pm (prior to triage call)  -has multiple bowel movements >3, BRPR from hemmrhoids, black stool   -left lower quadrant abdominal pain, 3-4/10 that has been constant throughout the day      Plan: Patient presentation concerning for etoh withdrawal which is not currently being managed by outpatient regimen. Instructed patient to come to the ED to be evaluated for withdrawal symptoms. Of note patient mentions diarrhea, LLQ pain, black stools which is concerning for UGIB, with patient history notable for appendicitis 1 month ago s/p lap in [place].  Patient states he will go to the ED and his wife will drive patient to [facility] ED.      Patient discussed with and forwarded to triage attending, Dr. Mariea Stable for review.      Next OV: 04/13/2019      Avel Peace, MD  Signature Derived From Controlled Access Password, Apr 05, 2019, 9:40 PM  [place] Resident, PGY2    Cc:. [name] and triage attending

## 2019-04-05 NOTE — Progress Notes (Signed)
FAMILY MEDICINE TELEMEDICINE PROGRESS NOTE    CC:    Chief Complaint   Patient presents with   . ER F/U       SUBJECTIVE:    Brett Palmer is a 53 year old male who is being seen for the following issues:    ---------------------(data below generated by Trudi Ida, MD)--------------------    Patient Verification & Telemedicine Consent:    I am proceeding with this evaluation at the direct request of the patient.  I have verified this is the correct patient and have obtained verbal consent and written consent from the patient/ surrogate to perform this voluntary telemedicine evaluation (including obtaining history, performing examination and reviewing data provided by the patient).   The patient/ surrogate has the right to refuse this evaluation.  I have explained risks (including potential loss of confidentiality), benefits, alternatives, and the potential need for subsequent face to face care. Patient/ surrogate understands that there is a risk of medical inaccuracies given that our recommendations will be made based on reported data (and we must therefore assume this information is accurate).  Knowing that there is a risk that this information is not reported accurately, and that the telemedicine video, audio, or data feed may be incomplete, the patient agrees to proceed with evaluation and holds [place] harmless knowing these risks. In this evaluation, we will be providing recommendations only.  The ultimate decision to follow, or not follow, these recommendations will be left to the bedside treating/ requesting practitioner.  The patient/ surrogate has been notified that other healthcare professionals (including students, residents and Metallurgist) may be involved in this audio-video evaluation.   All laws concerning confidentiality and patient access to medical records and copies of medical records apply to telemedicine.  The patient/ surrogate has received the [place] Notice of Privacy Practices.  I  have reviewed this above verification and consent paragraph with the patient/ surrogate.  If the patient is not capacitated to understand the above, and no surrogate is available, since this is not an emergency evaluation, the visit will be rescheduled until such time that the patient can consent, or the surrogate is available to consent.    Demographics:   Medical Record #: [id number]   Date: Apr 05, 2019   Patient Name: Brett Palmer   DOB: [birth year]  Age: 53 year old  Sex: male  Location: Home address on file      Evaluator(s):   Brett Palmer was evaluated by me today.    Clinic Location:  [facility]  [address]  [place] [address]   3:31PM-4:20PM    49 minutes of what became a 60 minute appointment was spent face to face with patient/cargiver in coordinating care and counseling for the below issues.    HPI by Problem:     1) ED follow up - went to ED yesterday for abd pain and nausea/dry heaving - concerned about complication from recent appendectomy. 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   Per ED note- pt declined hospital admission due to family obligations. Stated he and wife tried to call to be admitted directly to hospital but told that he  would have to go back to ED. States that he gets PTSD thinking about going back to enclosed room in ED   EtOH withdrawal- 25mg  QID, concerned that dose is too low, went home last night and felt like he was having "DTs" and that after getting home last night after taking meds twice- had "focal seizures" x 2-3 times- legs shaking    EtOH history - Since appendectomy- 1.5 bottle- 2 bottle wine  Also drinks scotch occasionally but not recently    Last took librium 1.5 hrs ago and feels a little  out of it  Last drink on Sunday, 3 days ago- 1 bottle chardonnay   EtOH use worse over last few years- previously drank while in [place], 2005 heavy drinker 1 bottle liquor  No hx of withdrawals - will self-medicate and drink alcohol for anxiety and PTSD- hx of traveling nurse and medic- has used EtOH to help cope with anxiety  Goes to [facility], has done counseling at [place] but nothing currently  Started to show more liver disease in 2011    CIWA:   NAUSEA AND VOMITING: Ask "Do you feel sick to your stomach? Have you vomited?" Observation.      No nausea and no vomiting (0 points)   X  Mild nausea with no vomiting (1 point)      (2 points)      (3 points)     Intermittent nausea with dry heaves (4 points)      (5 points)      (6 points)     Constant nausea, frequent dry heaves, and vomiting (7 points)   TREMOR: Arms extended and fingers spread apart. Observation.      No tremor (0 points)   X  Not visible, but can be felt fingertip to fingertip (1 point)      (2 points)      (3 points)     Moderate, with patient's arms extended (4 points)      (5 points)      (6 points)     Severe, even with arms not extended (7 points)   PAROXYSMAL SWEATS: Observation.    X  No sweat visible (0 points)     Barely perceptible sweating, palms moist (1 point)      (2 points)      (3 points)     Beads of sweat obvious on forehead (4 points)      (5 points)      (6 points)     Drenching sweats (7 points)   ANXIETY: Ask "Do you feel nervous?" Observation.      No anxiety, at ease (0 points)     Mildly anxious (1 point)   X   (2 points)      (3 points)     Moderately anxious, or guarded, so anxiety is inferred (4 points)      (5 points)      (6 points)     Equivalent to acute panic states as seen in severe delirium or acute schizophrenic reactions (7 points)   AGITATION: Observation.    X  Normal activity (0 points)     Somewhat more than normal activity (1 point)      (2 points)      (3 points)     Moderately fidgety and  restless (4 points)      (5 points)      (6 points)     Paces back and forth during  most of the interview, or constantly thrashes about (7 points)   TACTILE DISTURBANCES: Ask "Do you have any itching, pins and needles sensations, burning sensations, numbness, or the feeling of bugs crawling on or under your skin?" Observation.      None (0 points)   X  Very mild itching, pins and needles, burning or numbness (1 point)     Mild itching, pins and needles, burning or numbness (2 points)     Moderate itching, pins and needles, burning or numbness (3 points)     Moderately severe hallucinations (4 points)     Severe hallucinations (5 points)     Extremely severe hallucinations (6 points)     Continuous hallucinations (7 points)   AUDITORY DISTURBANCES: Ask "Are you more aware of sounds around you? Are they harsh? Do they frighten you? Are you hearing anything that is disturbing to you? Are you hearing things you know are not there?" Observation.    X  Not present (0 points)     Very mild harshness or ability to frighten (1 point)     Mild harshness or ability to frighten (2 points)     Moderate harshness or ability to frighten (3 points)     Moderately severe hallucinations (4 points)     Severe hallucinations (5 points)     Extremely severe hallucinations (6 points)     Continuous hallucinations (7 points)   VISUAL DISTURBANCES: Ask "Does the light appear to be too bright? Is its color different? Does it hurt your eyes? Are you seeing anything that is disturbing to you? Are you seeing things you know are not there?" Observation.      Not present (0 points)   X  Very mild sensitivity (1 point)     Mild sensitivity (2 points)     Moderate sensitivity (3 points)     Moderately severe hallucinations (4 points)     Severe hallucinations (5 points)     Extremely severe hallucinations (6 points)     Continuous hallucinations (7 points)   HEADACHE, FULLNESS IN HEAD: Ask "Does your head feel different? Does it feel as if there  is a band around your head?" Do not rate for dizziness or lightheadedness. Otherwise, rate severity.    X  Not present (0 points)     Very mild (1 point)     Mild (2 points)     Moderate (3 points)     Moderately severe (4 points)     Severe (5 points)     Very severe (6 points)     Extremely severe (7 points)   ORIENTATION AND CLOUDING OF SENSORIUM: Ask "What day is this? Where are you? Who am I?" Count forward by three.    X  Oriented and can do serial additions (0 points)     Cannot do serial additions or is uncertain about date (1 point)     Disoriented for date by no more than two calendar days (2 points)     Disoriented for date by more than two calendar days (3 points)     Disoriented for place and/or person (4 points)   CIWA- 6  Going to [place] on Saturday, will be away for 3 weeks, flying, visiting family     Review of Systems:   As per HPI       Patient Active Problem List   Diagnosis   . Hypertensive disorder   . Anxiety   . Palpitations   .  ETOH abuse   . PTSD (post-traumatic stress disorder)   . Transaminitis   . Inadequate exercise - not at goal   . Alcoholic cirrhosis of liver without ascites (CMS-HCC)       Outpatient Medications Prior to Visit   Medication Sig Dispense Refill   . albuterol 108 (90 BASE) MCG/ACT inhaler Inhale 2 puffs by mouth every 6 hours as needed for Wheezing. 1 Inhaler 2   . ALPRAZolam (XANAX) 1 MG tablet Take 1 tablet (1 mg) by mouth 3 times daily as needed for Anxiety. 12 tablet 0   . chlordiazePOXIDE (LIBRIUM) 25 MG capsule Take 1 capsule (25 mg) by mouth every 6 hours for 3 days. 12 capsule 0   . dicyclomine (BENTYL) 10 MG capsule Take 1 capsule (10 mg) by mouth 3 times daily (before meals) for 5 days. 15 capsule 0   . Esomeprazole Magnesium (NEXIUM PO) daily.     . fluticasone propionate (FLONASE) 50 MCG/ACT nasal spray Spray 2 sprays into each nostril daily. 1 bottle 11   . hydrochlorothiazide (HYDRODIURIL) 12.5 MG tablet Take 1 tablet (12.5 mg) by mouth daily. 30  tablet 1   . SUMAtriptan (IMITREX) 6 MG/0.5ML SOLN subcutaneous injection Inject 0.5 mL (6 mg) under the skin once for 1 dose. 15 mL 0   . zolpidem (AMBIEN) 10 MG tablet Take 10 mg by mouth nightly as needed for Insomnia.       No facility-administered medications prior to visit.      OBJECTIVE:  Physical Exam: General Appearance: healthy, alert, no distress, anxious affect, cooperative. A&OX4, no visible tremors or precipitation  HEENT: NCAT, MMM  CV: Skin well-perfused  Pulm: unlabored, bilat symmetric chest rise  Psych: anxious affect, no SI/HI     LABS:  Results for orders placed or performed during the hospital encounter of 63/33/54   Basic Metabolic Panel, Blood Green Plasma Separator Tube   Result Value Ref Range    Glucose 135 (H) 70 - 99 mg/dL    BUN 7 6 - 20 mg/dL    Creatinine 0.86 0.67 - 1.17 mg/dL    GFR >60 mL/min    Sodium 137 136 - 145 mmol/L    Potassium 4.3 3.5 - 5.1 mmol/L    Chloride 98 98 - 107 mmol/L    Bicarbonate 20 (L) 22 - 29 mmol/L    Anion Gap 19 (H) 7 - 15 mmol/L    Calcium 9.5 8.5 - 10.6 mg/dL   HIV 1/2 Antibody & P24 Antigen Assay Yellow serum separator tube   Result Value Ref Range    HIV 1/2 Antibody & P24 Antigen Assay Sent for confirmatory testing    Liver Panel, Blood Green Plasma Separator Tube   Result Value Ref Range    Total Protein 8.0 6.0 - 8.0 g/dL    Albumin 4.7 3.5 - 5.2 g/dL    Bilirubin, Dir 0.6 (H) <0.2 mg/dL    Bilirubin, Tot 1.82 (H) <1.2 mg/dL    AST (SGOT) 122 (H) 0 - 40 U/L    ALT (SGPT) 52 (H) 0 - 41 U/L    Alkaline Phos 97 40 - 129 U/L   Lipase, Blood Green Plasma Separator Tube   Result Value Ref Range    Lipase 23 13 - 60 U/L   Lactate, Blood - See Instructions   Result Value Ref Range    Lactate 2.7 (H) 0.5 - 2.0 mmol/L   Lactate, Blood - See Instructions   Result Value Ref Range  Lactate 1.8 0.5 - 2.0 mmol/L   CBC w/ Diff Lavender   Result Value Ref Range    WBC 5.8 4.0 - 10.0 1000/mm3    RBC 5.13 4.60 - 6.10 mill/mm3    Hgb 15.6 13.7 - 17.5 gm/dL    Hct  46.7 40.0 - 50.0 %    MCV 91.0 79.0 - 95.0 um3    MCH 30.4 26.0 - 32.0 pgm    MCHC 33.4 32.0 - 36.0 g/dL    RDW 14.7 (H) 12.0 - 14.0 %    MPV 9.7 9.4 - 12.4 fL    Plt Count 100 (L) 140 - 370 1000/mm3    Segs 74 %    Lymphocytes 17 %    Monocytes 8 %    Eosinophils 0 %    Basophils 0 %    ANC-Automated 4.3 1.6 - 7.0 1000/mm3    Abs Lymphs 1.0 0.8 - 3.1 1000/mm3    Abs Monos 0.4 0.2 - 0.8 1000/mm3    Abs Eosinophils 0.0 <0.1 - 0.5 1000/mm3    Abs Basophils 0.0 <0.1 1000/mm3    Diff Type Automated    Prothrombin Time, Blood Blue   Result Value Ref Range    PT,Patient 13.7 (H) 9.7 - 12.5 sec    INR 1.3    Urinalysis with Culture Reflex, when indicated   Result Value Ref Range    Type Not Specified     Color Amber Yellow    Appearance Slightly Hazy Clear    Specific Gravity 1.028 1.002 - 1.030    pH 6.0 5.0 - 8.0    Protein Negative Negative    Glucose Negative Negative    Ketones Trace (A) Negative    Bilirubin Negative Negative    Blood Negative Negative    Urobilinogen Negative Negative    Nitrite Negative Negative    Leuk Esterase Negative Negative    WBC 0-2 0-2/HPF    RBC 0-2 0-2/HPF    Bacteria Rare None-Rare/HPF    Squam. Epithelial Cell 0-5(RARE) <6-10(FEW)    Mucus Rare None-Rare/HPF    Hyaline Cast >5 (A) 7-6/HMC   Basic Metabolic Panel, Blood Green Plasma Separator Tube   Result Value Ref Range    Glucose 119 (H) 70 - 99 mg/dL    BUN 6 6 - 20 mg/dL    Creatinine 0.77 0.67 - 1.17 mg/dL    GFR >60 mL/min    Sodium 138 136 - 145 mmol/L    Potassium 4.2 3.5 - 5.1 mmol/L    Chloride 101 98 - 107 mmol/L    Bicarbonate 23 22 - 29 mmol/L    Anion Gap 14 7 - 15 mmol/L    Calcium 9.0 8.5 - 10.6 mg/dL   RBC Morphology   Result Value Ref Range    Plt Est Decreased     RBC Comment Normal     Macrocytic 1+     Polychromasia 1+    Phosphorus, Blood   Result Value Ref Range    Phosphorous 2.4 (L) 2.7 - 4.5 mg/dL   Magnesium, Blood   Result Value Ref Range    Magnesium 2.4 1.6 - 2.6 mg/dL   HIV 1/2 Confirmatory Supplemental  Assay   Result Value Ref Range    HIV-1 Supplemental Confirmatory Test Negative     HIV-2 Supplemental Confirmatory Test Negative    HIV-1 RNA Quant, Blood   Result Value Ref Range    HIV-1 RNA Ultra Quant, Plasma Not Detected See  Comment copies/mL   Rapid COVID-19 Assay   Result Value Ref Range    COVID-19 Source Nasal     COVID-19 Rapid Assay Not Detected            ASSESSMENT & PLAN:  Ayub Kirsh is a 53 year old male was seen today for:  Sarthak was seen today for er f/u.    Diagnoses and all orders for this visit:    Alcohol withdrawal syndrome with complication (CMS-HCC)  Pt very anxious about EtOH withdrawal and with CIWA 6. Discussed risk of ambulatory treatment for EtOH withdrawal and unable to titrate librium to high doses given risk of oversedation. However given increased shaking at night, will increase to librium 50mg  TID x 1 day, then 25mg  q6hours. Will follow up in 2 days. Pt aware of ED precautions if symptoms worsening. States his wife is at home to monitor and will help with administering medication.   -     chlordiazePOXIDE (LIBRIUM) 25 MG capsule; Take 1 capsule (25 mg) by mouth every 8 hours as needed for Anxiety.    Inadequate exercise - not at goal  PAVS Total Activity: 0 minutes/week minutes/week   I have counseled Brett Palmer on improving his level of exercise in the following way:     Physical activity was not addressed at this visit     Other orders  -     CURES Review Documentation - I Reviewed [place] Maintenance   Topic Date Due   . Shingles Vaccine (1 of 2) 01/24/2016   . INFLUENZA VACCINE  07/02/2019   . PHQ9 Depression Monitoring doc flowsheet  08/06/2019   . COLON CANCER SCREENING COLONOSCOPY HIGH RISK  06/30/2023   . Tetanus (2 - Td) 09/15/2026   . IMM pneumococcal 19-64 Medium Risk PPSV23 only  Completed   . Polio Vaccine  Aged Out   . HPV Vaccine <= 26 Yrs  Aged Out   . Meningococcal MCV4 Vaccine  Aged Out       Return in about 2 days (around 04/07/2019).    Patient  Instructions   50 mg q 8 hours for 3 doses today  Tomorrow - start 25 mg q 6hours       Trudi Ida, MD      Plan discussed with pt including risks/benefits/alternatives including watchful waiting.  Informed pt of 24/7 on call MD.  ED if acutely worsening after hours.  Pt verbalized understanding.    Medications reviewed with patient and medication list reconciled.  Over the counter medications, herbal therapies and supplements reviewed.  Patient's understanding and response to medications assessed.    Barriers to medications assessed and addressed.  Risks, benefits, alternatives to medications reviewed.

## 2019-04-06 ENCOUNTER — Telehealth (INDEPENDENT_AMBULATORY_CARE_PROVIDER_SITE_OTHER): Payer: Self-pay | Admitting: Family Medicine

## 2019-04-06 ENCOUNTER — Ambulatory Visit (HOSPITAL_COMMUNITY)
Admit: 2019-04-06 | Discharge: 2019-04-06 | Disposition: A | Discharge status: Home Health | Payer: Self-pay | Source: Emergency Department

## 2019-04-06 ENCOUNTER — Inpatient Hospital Stay
Admission: EM | Admit: 2019-04-06 | Discharge: 2019-04-07 | DRG: 897 | Disposition: A | Discharge status: Home / Self Care | Payer: Commercial Managed Care - HMO | Attending: Family Medicine | Admitting: Family Medicine

## 2019-04-06 DIAGNOSIS — D696 Thrombocytopenia, unspecified: Secondary | ICD-10-CM

## 2019-04-06 DIAGNOSIS — R7401 Elevation of levels of liver transaminase levels: Secondary | ICD-10-CM | POA: Diagnosis present

## 2019-04-06 DIAGNOSIS — F10939 Alcohol use, unspecified with withdrawal, unspecified (CMS-HCC): Secondary | ICD-10-CM

## 2019-04-06 DIAGNOSIS — F4310 Post-traumatic stress disorder, unspecified: Secondary | ICD-10-CM | POA: Diagnosis present

## 2019-04-06 DIAGNOSIS — F411 Generalized anxiety disorder: Secondary | ICD-10-CM | POA: Diagnosis present

## 2019-04-06 DIAGNOSIS — F1320 Sedative, hypnotic or anxiolytic dependence, uncomplicated: Secondary | ICD-10-CM

## 2019-04-06 DIAGNOSIS — F419 Anxiety disorder, unspecified: Secondary | ICD-10-CM

## 2019-04-06 DIAGNOSIS — F10239 Alcohol dependence with withdrawal, unspecified: Principal | ICD-10-CM

## 2019-04-06 DIAGNOSIS — K922 Gastrointestinal hemorrhage, unspecified: Secondary | ICD-10-CM

## 2019-04-06 DIAGNOSIS — K7030 Alcoholic cirrhosis of liver without ascites: Secondary | ICD-10-CM

## 2019-04-06 LAB — LIVER PANEL, BLOOD
ALT (SGPT): 52 U/L — ABNORMAL HIGH (ref 0–41)
AST (SGOT): 117 U/L — ABNORMAL HIGH (ref 0–40)
Albumin: 4.4 g/dL (ref 3.5–5.2)
Alkaline Phos: 95 U/L (ref 40–129)
Bilirubin, Dir: 0.6 mg/dL — ABNORMAL HIGH (ref <0.2)
Bilirubin, Tot: 1.42 mg/dL — ABNORMAL HIGH (ref <1.2)
Total Protein: 7.1 g/dL (ref 6.0–8.0)

## 2019-04-06 LAB — CBC WITH DIFF, BLOOD
ANC-Automated: 3 10*3/uL (ref 1.6–7.0)
ANC-Automated: 2.4 10*3/uL (ref 1.6–7.0)
Abs Basophils: 0 10*3/uL (ref <0.1)
Abs Basophils: 0 10*3/uL (ref <0.1)
Abs Eosinophils: 0.1 10*3/uL (ref 0.1–0.5)
Abs Eosinophils: 0.1 10*3/uL (ref 0.1–0.5)
Abs Lymphs: 1.7 10*3/uL (ref 0.8–3.1)
Abs Lymphs: 1.9 10*3/uL (ref 0.8–3.1)
Abs Monos: 0.4 10*3/uL (ref 0.2–0.8)
Abs Monos: 0.5 10*3/uL (ref 0.2–0.8)
Basophils: 0 %
Basophils: 0 %
Eosinophils: 2 %
Eosinophils: 2 %
Hct: 41 % (ref 40.0–50.0)
Hct: 42.4 % (ref 40.0–50.0)
Hgb: 13.8 gm/dL (ref 13.7–17.5)
Hgb: 14.1 gm/dL (ref 13.7–17.5)
Lymphocytes: 32 %
Lymphocytes: 39 %
MCH: 30.8 pg (ref 26.0–32.0)
MCH: 31.3 pg (ref 26.0–32.0)
MCHC: 33.7 g/dL (ref 32.0–36.0)
MCHC: 33.3 g/dL (ref 32.0–36.0)
MCV: 91.5 um3 (ref 79.0–95.0)
MCV: 94 um3 (ref 79.0–95.0)
MPV: 9.8 fL (ref 9.4–12.4)
MPV: 9.9 fL (ref 9.4–12.4)
Monocytes: 8 %
Monocytes: 10 %
Plt Count: 85 10*3/uL — ABNORMAL LOW (ref 140–370)
Plt Count: 86 10*3/uL — ABNORMAL LOW (ref 140–370)
RBC: 4.48 10*6/uL — ABNORMAL LOW (ref 4.60–6.10)
RBC: 4.51 10*6/uL — ABNORMAL LOW (ref 4.60–6.10)
RDW: 15 % — ABNORMAL HIGH (ref 12.0–14.0)
RDW: 15.3 % — ABNORMAL HIGH (ref 12.0–14.0)
Segs: 57 %
Segs: 49 %
WBC: 5.2 10*3/uL (ref 4.0–10.0)
WBC: 4.9 10*3/uL (ref 4.0–10.0)

## 2019-04-06 LAB — PHOSPHORUS, BLOOD: Phosphorous: 3.9 mg/dL (ref 2.7–4.5)

## 2019-04-06 LAB — BASIC METABOLIC PANEL, BLOOD
Anion Gap: 15 mmol/L (ref 7–15)
BUN: 8 mg/dL (ref 6–20)
Bicarbonate: 22 mmol/L (ref 22–29)
Calcium: 9 mg/dL (ref 8.5–10.6)
Chloride: 103 mmol/L (ref 98–107)
Creatinine: 0.76 mg/dL (ref 0.67–1.17)
GFR: >60 mL/min
Glucose: 101 mg/dL — ABNORMAL HIGH (ref 70–99)
Potassium: 3.8 mmol/L (ref 3.5–5.1)
Sodium: 140 mmol/L (ref 136–145)

## 2019-04-06 LAB — ECG 12-LEAD
ATRIAL RATE: 116 {beats}/min
P AXIS: 35 degrees
PR INTERVAL: 170 ms
QRS INTERVAL/DURATION: 80 ms
QT: 328 ms
QTC INTERVAL: 455 ms
R AXIS: 89 degrees
T AXIS: 33 degrees
VENTRICULAR RATE: 116 {beats}/min

## 2019-04-06 LAB — URINALYSIS WITH CULTURE REFLEX, WHEN INDICATED
Bilirubin: NEGATIVE
Blood: NEGATIVE
Glucose: NEGATIVE
Ketones: NEGATIVE
Leuk Esterase: NEGATIVE
Nitrite: NEGATIVE
Protein: NEGATIVE
Specific Gravity: 1.005 (ref 1.002–1.030)
pH: 8 (ref 5.0–8.0)

## 2019-04-06 LAB — LIPASE, BLOOD
Lipase: 44 U/L (ref 13–60)
Lipase: 42 U/L (ref 13–60)

## 2019-04-06 LAB — PROTHROMBIN TIME, BLOOD
INR: 1.2
PT,Patient: 13.6 s — ABNORMAL HIGH (ref 9.7–12.5)

## 2019-04-06 LAB — COMPREHENSIVE METABOLIC PANEL, BLOOD
ALT (SGPT): 49 U/L — ABNORMAL HIGH (ref 0–41)
AST (SGOT): 109 U/L — ABNORMAL HIGH (ref 0–40)
Albumin: 4.1 g/dL (ref 3.5–5.2)
Alkaline Phos: 91 U/L (ref 40–129)
Anion Gap: 11 mmol/L (ref 7–15)
BUN: 7 mg/dL (ref 6–20)
Bicarbonate: 23 mmol/L (ref 22–29)
Bilirubin, Tot: 1.5 mg/dL — ABNORMAL HIGH (ref <1.2)
Calcium: 9 mg/dL (ref 8.5–10.6)
Chloride: 106 mmol/L (ref 98–107)
Creatinine: 0.67 mg/dL (ref 0.67–1.17)
GFR: >60 mL/min
Glucose: 95 mg/dL (ref 70–99)
Potassium: 3.9 mmol/L (ref 3.5–5.1)
Sodium: 140 mmol/L (ref 136–145)
Total Protein: 7.5 g/dL (ref 6.0–8.0)

## 2019-04-06 LAB — TYPE & SCREEN
ABO/RH: O NEG
Antibody Screen: NEGATIVE

## 2019-04-06 LAB — APTT, BLOOD
PTT: 35 s — ABNORMAL HIGH (ref 25–34)
PTT: 35 s — ABNORMAL HIGH (ref 25–34)

## 2019-04-06 LAB — ALCOHOL, BLOOD: Alcohol: <11 mg/dL

## 2019-04-06 LAB — MAGNESIUM, BLOOD: Magnesium: 2.1 mg/dL (ref 1.6–2.6)

## 2019-04-06 MED ORDER — LORAZEPAM 2 MG/ML IJ SOLN
1.0000 mg | INTRAMUSCULAR | Status: DC | PRN
Start: 2019-04-06 — End: 2019-04-07

## 2019-04-06 MED ORDER — LORAZEPAM 2 MG/ML IJ SOLN
1.0000 mg | INTRAMUSCULAR | Status: DC | PRN
Start: 2019-04-06 — End: 2019-04-06

## 2019-04-06 MED ORDER — SODIUM CHLORIDE 0.9 % IJ SOLN (CUSTOM)
3.0000 mL | INTRAMUSCULAR | Status: DC | PRN
Start: 2019-04-06 — End: 2019-04-07

## 2019-04-06 MED ORDER — LORAZEPAM 1 MG OR TABS
1.0000 mg | ORAL_TABLET | ORAL | Status: DC | PRN
Start: 2019-04-06 — End: 2019-04-07
  Administered 2019-04-06 (×3): 1 mg via ORAL
  Filled 2019-04-06 (×2): qty 1

## 2019-04-06 MED ORDER — FOLIC ACID 1 MG OR TABS
1.0000 mg | ORAL_TABLET | Freq: Every day | ORAL | Status: DC
Start: 2019-04-06 — End: 2019-04-07
  Administered 2019-04-06 – 2019-04-07 (×3): 1 mg via ORAL
  Filled 2019-04-06 (×2): qty 1

## 2019-04-06 MED ORDER — HYDROXYZINE PAMOATE 50 MG OR CAPS
50.0000 mg | ORAL_CAPSULE | Freq: Every evening | ORAL | Status: DC | PRN
Start: 2019-04-06 — End: 2019-04-07

## 2019-04-06 MED ORDER — NALOXONE HCL 0.4 MG/ML IJ SOLN
0.1000 mg | INTRAMUSCULAR | Status: DC | PRN
Start: 2019-04-06 — End: 2019-04-07

## 2019-04-06 MED ORDER — SODIUM CHLORIDE 0.9 % IJ SOLN (CUSTOM)
3.0000 mL | Freq: Three times a day (TID) | INTRAMUSCULAR | Status: DC
Start: 2019-04-06 — End: 2019-04-07
  Administered 2019-04-06 – 2019-04-07 (×4): 3 mL via INTRAVENOUS

## 2019-04-06 MED ORDER — VITAMIN B-1 100 MG OR TABS
100.0000 mg | ORAL_TABLET | Freq: Every day | ORAL | Status: DC
Start: 2019-04-06 — End: 2019-04-06

## 2019-04-06 MED ORDER — SODIUM CHLORIDE 0.9 % IV SOLN
INTRAVENOUS | Status: DC
Start: 2019-04-06 — End: 2019-04-06
  Administered 2019-04-06: 09:00:00 via INTRAVENOUS

## 2019-04-06 MED ORDER — MELATONIN 5 MG OR TABS
5.0000 mg | ORAL_TABLET | Freq: Every evening | ORAL | Status: DC
Start: 2019-04-06 — End: 2019-04-07
  Administered 2019-04-06: 21:00:00 5 mg via ORAL
  Filled 2019-04-06: qty 1

## 2019-04-06 MED ORDER — PANTOPRAZOLE SODIUM 40 MG IV SOLR
40.0000 mg | Freq: Two times a day (BID) | INTRAVENOUS | Status: DC
Start: 2019-04-06 — End: 2019-04-07
  Administered 2019-04-06 – 2019-04-07 (×4): 40 mg via INTRAVENOUS
  Filled 2019-04-06 (×3): qty 40

## 2019-04-06 MED ORDER — FOLIC ACID 1 MG OR TABS
1.0000 mg | ORAL_TABLET | Freq: Every day | ORAL | Status: DC
Start: 2019-04-06 — End: 2019-04-06

## 2019-04-06 MED ORDER — CLONAZEPAM 1 MG OR TABS
1.0000 mg | ORAL_TABLET | Freq: Three times a day (TID) | ORAL | Status: DC
Start: 2019-04-06 — End: 2019-04-07
  Administered 2019-04-06 – 2019-04-07 (×3): 1 mg via ORAL
  Filled 2019-04-06: qty 1
  Filled 2019-04-06 (×2): qty 2

## 2019-04-06 MED ORDER — SODIUM CHLORIDE 0.9% TKO INFUSION
INTRAVENOUS | Status: DC | PRN
Start: 2019-04-06 — End: 2019-04-07

## 2019-04-06 MED ORDER — ONDANSETRON HCL 4 MG/2ML IV SOLN
4.0000 mg | Freq: Four times a day (QID) | INTRAMUSCULAR | Status: DC | PRN
Start: 2019-04-06 — End: 2019-04-07

## 2019-04-06 MED ORDER — VITAMIN B-1 100 MG OR TABS
100.0000 mg | ORAL_TABLET | Freq: Every day | ORAL | Status: DC
Start: 2019-04-07 — End: 2019-04-07
  Administered 2019-04-07: 100 mg via ORAL
  Filled 2019-04-06: qty 1

## 2019-04-06 MED ORDER — VITAMIN B-1 100 MG OR TABS
100.0000 mg | ORAL_TABLET | Freq: Once | ORAL | Status: AC
Start: 2019-04-06 — End: 2019-04-06
  Administered 2019-04-06: 06:00:00 100 mg via ORAL
  Filled 2019-04-06: qty 1

## 2019-04-06 MED ORDER — SODIUM CHLORIDE 0.9 % IV SOLN
12.5000 mg | Freq: Once | INTRAVENOUS | Status: AC
Start: 2019-04-06 — End: 2019-04-06
  Administered 2019-04-06: 02:00:00 12.5 mg via INTRAVENOUS
  Filled 2019-04-06: qty 0.5

## 2019-04-06 NOTE — Telephone Encounter (Signed)
Telephone contact and advice reviewed with Dr. Alhassan.  I concur with the documentation and outcome of this Telephone call.    (cc: Celebi, Julie Marie)    Tyreonna Czaplicki MD

## 2019-04-06 NOTE — Event / Update (Signed)
Paged by RN that pt is requesting to leave AMA. Went to bedside to assess.    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 Pt expressed that the room he is in feels very confining and makes him more anxious. Explained to pt that if he were to leave AMA, the risks would include: ETOH withdrawal, benzodiazepine withdrawal, recurrent seizure, and death. Explained to pt that due to his recent seizure he is at increased risk of having an adverse outcome and he already failed the home taper with librium. Upon further discussion pt agreed to stay if he would be transferred to another room. Discussed with bedside RN and Agricultural consultant. Will move pt to a 2 patient occupancy room with a privacy barrier and a window.     Pt was actively tremulous and diaphoretic with facial flushing during this discussion. Requested RN to dispense PRN ativan.     Plan: Will actively keep on top of pt's CIWA protocol. Pt takes Xanax daily at home and thus requires an increased amount of benzodiazepines (also to prevent benzo withdrawal) to control his symptoms. Will continue with Klonopin TID and Ativan PRN.      Letitia Neri, MD  PGY-1  Family Medicine

## 2019-04-06 NOTE — ED Notes (Signed)
0315 Pt signed consent for transfer to [facility], IMU room 533D. Ambulance ETA 0400.  0325 Handoff report given to [facility].

## 2019-04-06 NOTE — ED Notes (Signed)
Attempted to call report to [facility], nurse Claiborne Billings unavailable at this time,  She will call back.

## 2019-04-06 NOTE — H&P (Signed)
Attending MD:   Lessie Dings    Chief Complaint:  Etoh Withdrawal and Diarrhea     History of present illness  Brett Palmer is a 53 year old male with a medical history including Etoh abuse, Etoh cirrrhosis, HTN, SVT s/p ablation, appendectomy (03/2019)that presented for etoh withdrawal and diarrhea with black stools.     Per chart review, patient was evaluated in ED 04/04/19 for RLQ abdominal pain, fevers, n/v, poor PO intake and diarrhea that occurred for a few days last week in context of recent appendectomy followed by antibiotic course. During ED visit CT abdomen was done significant for RLQ postsurgical changes of appendectomy without evidence of an acute process. His symptoms improved and he was able to tolerate PO, however given his etoh withdrawal symptoms he was offered inpatient admission but he declined due to scheduling conflict. Instead patient was sent home with librium taper and close PCP follow up.    04/05/19 patient had interview with [facility] 6, however patient felt Librium dosage 25mg  QID was not effective. Patient reported symptoms of shaking at night felt to be seizures and extreme anxiety. Per PCP note she was willing to increase librium to 50mg  TID x1day, then 25mg  q6hrs for taper. However patient called into triage line later in the evening given continued anxiety,  sweating, fogginess, word spelling difficulty, and concern for further withdrawal symptoms including seizure. During triage call patient states he was unaware of medication adjustment of Librium 50mg  TID, and continued to take librium 25mg  q6hrs with last dosage around 3-4pm. His last drink was 04/03/19 afternoon, stated he has rearranged his schedule so that he can be admitted to the hospital for w/d symptoms. Per his [employer] application heart rate 798-921, pulse ox 86-91, BP unable to take it (battery died), T 98.6, these vitals were taken 930pm (prior to triage call).    Other symptoms include nausea but denies vomiting,  has some PO intake with food and at least 1.5L of water today. States SOB and dizzy ambulating steps, chest tightness but denies symptoms currently. Has multiple bowel movements >3, BRPR from hemmrhoids, black stool, left lower quadrant abdominal pain, 3-4/10 that has been constant throughout the day. Per triage patient instructed to go to the ED for further evaluation. ED course below.       Review of Systems:  Gen: (-) weight changes, fevers, chills, sweats  HEENT: (-) nosebleeds, dysphagia  CV: (-) palpitations, chest pain  Resp: (-) cough, wheezing, SOB  GI: (-) heartburn, abdominal pain, melena  GU: (-) decreased urine output  (-) dysuria, frequency, hematuria  MS: (-) joint pain, muscle pain/weakness  Endocrine: (-) polyuria/polydipsia, coarse hair, dry skin,   Integumentary: (-) skin breakdown, erythema, rash  Neuro: (-) headache, (-) numbness/tingling, tremors  Psych: (-) mood swings, depressive symptoms, insomnia, anxiety    Emergency Department Course:    Vitals: afebrile, max pulse 96, BP 146/78, spO2 96 on RA  Labs: AST 95, ALT 52, Lipase 44, WBC 5.2, Hgb 13.8, PLT 85, stool guaic positive   Imaging: none  Given:Phenergan 12.5mg  x1     Medications   LORazepam (ATIVAN) injection 1 mg (has no administration in time range)   thiamine (VITAMIN B1) tablet 100 mg (has no administration in time range)   folic acid (FOLVITE) tablet 1 mg (has no administration in time range)   promethazine (PHENERGAN) 12.5 mg in sodium chloride 0.9 % 50 mL IVPB (0 mg IntraVENOUS Completed 04/06/19 0239)  Recent hospitalizations  None     Past medical history  Patient Active Problem List   Diagnosis   . Hypertensive disorder   . Anxiety   . Palpitations   . ETOH abuse   . PTSD (post-traumatic stress disorder)   . Transaminitis   . Inadequate exercise - not at goal   . Alcoholic cirrhosis of liver without ascites (CMS-HCC)     Past Medical History:   Diagnosis Date   . Alcohol use    . Gastroesophageal reflux disease    .  Hypertension    . Migraine headache     part of [place] war syndrome   . PTSD (post-traumatic stress disorder) 1995-present    treated by Dr. Joen Laura at [facility]    . SVT (supraventricular tachycardia) (CMS-HCC) 2011     Past Surgical History:   Procedure Laterality Date   . GALLBLADDER SURGERY  2011       Allergies: Compazine; Latex; Gabapentin; Norco [hydrocodone-acetaminophen]; and Vicodin [apap-fd&c yellow #10 [place]-hydrocodone]     Home Medications  Previous Medications    ALBUTEROL 108 (90 BASE) MCG/ACT INHALER    Inhale 2 puffs by mouth every 6 hours as needed for Wheezing.    ALPRAZOLAM (XANAX) 1 MG TABLET    Take 1 tablet (1 mg) by mouth 3 times daily as needed for Anxiety.    CHLORDIAZEPOXIDE (LIBRIUM) 25 MG CAPSULE    Take 1 capsule (25 mg) by mouth every 6 hours for 3 days.    CHLORDIAZEPOXIDE (LIBRIUM) 25 MG CAPSULE    Take 1 capsule (25 mg) by mouth every 8 hours as needed for Anxiety.    DICYCLOMINE (BENTYL) 10 MG CAPSULE    Take 1 capsule (10 mg) by mouth 3 times daily (before meals) for 5 days.    ESOMEPRAZOLE MAGNESIUM (NEXIUM PO)    daily.    FLUTICASONE PROPIONATE (FLONASE) 50 MCG/ACT NASAL SPRAY    Spray 2 sprays into each nostril daily.    HYDROCHLOROTHIAZIDE (HYDRODIURIL) 12.5 MG TABLET    Take 1 tablet (12.5 mg) by mouth daily.    SUMATRIPTAN (IMITREX) 6 MG/0.5ML SOLN SUBCUTANEOUS INJECTION    Inject 0.5 mL (6 mg) under the skin once for 1 dose.    ZOLPIDEM (AMBIEN) 10 MG TABLET    Take 10 mg by mouth nightly as needed for Insomnia.       Social History (per chart)  Tobacco: quit smoking about 29 years ago, 5ppy   Alcohol: heavy etoh use, last drink 04/03/19  Drugs: denies   Occupation: retired   Living: Domiciled lives with wife.  Sex: n/a    Family History  Family History   Problem Relation Name Age of Onset   . Cancer Father          melanoma, died at age 67   . Heart Disease Mother          CHF, thought to have passed from MI   . Heart Disease Maternal Aunt          CHF         Physical  exam  Temperature:  [99 F (37.2 C)] 99 F (37.2 C) (05/05 2253)  Blood pressure (BP): (132-146)/(74-78) 132/74 (05/06 0208)  Heart Rate:  [81-96] 81 (05/06 0208)  Respirations:  [18] 18 (05/06 0208)  Pain Score: 7 (05/06 0208)  O2 Device: None (Room air) (05/06 0208)  SpO2:  [96 %] 96 % (05/06 0208)    Physical Exam  Constitutional: He is oriented to person, place, and time.   HENT:   Head: Normocephalic.   Right Ear: External ear normal.   Left Ear: External ear normal.   Tongue Fasiculations   Eyes: Pupils are equal, round, and reactive to light. Conjunctivae and EOM are normal. Right eye exhibits no discharge.   Neck: Normal range of motion. Neck supple.   Cardiovascular: Normal rate, regular rhythm and intact distal pulses.   Pulmonary/Chest: Effort normal and breath sounds normal. No respiratory distress.   Abdominal: Soft. Bowel sounds are normal. There is tenderness.   Mild epigastric tenderness, LLQ tenderness, suprapubic tenderness  Musculoskeletal: Normal range of motion.         General: No edema.     Lymphadenopathy:     He has no cervical adenopathy.   Neurological: He is alert and oriented to person, place, and time. He has normal reflexes.   Bilateral UE tremor L>R, negative asterixis    Skin: Skin is warm and dry. He is not diaphoretic.   Psychiatric: Affect normal.         Labs reviewed and notable for:  Results for orders placed or performed during the hospital encounter of 16/10/96   Basic Metabolic Panel, Blood Green Plasma Separator Tube   Result Value Ref Range    Glucose 101 (H) 70 - 99 mg/dL    BUN 8 6 - 20 mg/dL    Creatinine 0.76 0.67 - 1.17 mg/dL    GFR >60 mL/min    Sodium 140 136 - 145 mmol/L    Potassium 3.8 3.5 - 5.1 mmol/L    Chloride 103 98 - 107 mmol/L    Bicarbonate 22 22 - 29 mmol/L    Anion Gap 15 7 - 15 mmol/L    Calcium 9.0 8.5 - 10.6 mg/dL   Liver Panel, Blood Green Plasma Separator Tube   Result Value Ref Range    Total Protein 7.1 6.0 - 8.0 g/dL    Albumin 4.4 3.5 - 5.2  g/dL    Bilirubin, Dir 0.6 (H) <0.2 mg/dL    Bilirubin, Tot 1.42 (H) <1.2 mg/dL    AST (SGOT) 117 (H) 0 - 40 U/L    ALT (SGPT) 52 (H) 0 - 41 U/L    Alkaline Phos 95 40 - 129 U/L   Lipase, Blood Green Plasma Separator Tube   Result Value Ref Range    Lipase 44 13 - 60 U/L   CBC w/ Diff Lavender   Result Value Ref Range    WBC 5.2 4.0 - 10.0 1000/mm3    RBC 4.48 (L) 4.60 - 6.10 mill/mm3    Hgb 13.8 13.7 - 17.5 gm/dL    Hct 41.0 40.0 - 50.0 %    MCV 91.5 79.0 - 95.0 um3    MCH 30.8 26.0 - 32.0 pgm    MCHC 33.7 32.0 - 36.0 g/dL    RDW 15.0 (H) 12.0 - 14.0 %    MPV 9.8 9.4 - 12.4 fL    Plt Count 85 (L) 140 - 370 1000/mm3    Segs 57 %    Lymphocytes 32 %    Monocytes 8 %    Eosinophils 2 %    Basophils 0 %    ANC-Automated 3.0 1.6 - 7.0 1000/mm3    Abs Lymphs 1.7 0.8 - 3.1 1000/mm3    Abs Monos 0.4 0.2 - 0.8 1000/mm3    Abs Eosinophils 0.1 <0.1 - 0.5 1000/mm3    Abs Basophils  0.0 <0.1 1000/mm3    Diff Type Automated    Urinalysis with Culture Reflex, when indicated   Result Value Ref Range    Type Not Specified     Color Yellow Yellow    Appearance Clear Clear    Specific Gravity 1.005 1.002 - 1.030    pH 8.0 5.0 - 8.0    Protein Negative Negative    Glucose Negative Negative    Ketones Negative Negative    Bilirubin Negative Negative    Blood Negative Negative    Urobilinogen 1+ (A) Negative    Nitrite Negative Negative    Leuk Esterase Negative Negative    WBC 0-2 0-2/HPF    RBC 0-2 0-2/HPF    Bacteria Rare None-Rare/HPF   Alcohol, Blood Green Plasma Separator Tube   Result Value Ref Range    Alcohol <11 None mg/dL   aPTT, Blood Blue   Result Value Ref Range    PTT 35 (H) 25 - 34 sec       EKG/Echo:  04/04/19  VR 116, PR 170, QRS 80, QT/QTc 328/455  Sinus tachycardia  Nonspecific ST and T wave abnormality    Imaging   Ct Abdomen And Pelvis With Contrast    Result Date: 04/04/2019  IMPRESSION: CT scan of the abdomen and pelvis with IV contrast. 1. postsurgical changes of appendectomy; no evidence of intra-abdominal/pelvic  abscesses or diverticulitis. 2. Increased right ileocolic mesentery stranding, most likely postoperative changes. 3. Hepatomegaly with findings suggestive of mild steatosis. No imaging features of advanced cirrhosis.     EGD 11/17/2018  FINDINGS/Diagnosis  1) No esophageal or gastric varices  2) Normal GE junction at 40cm  3) Mild PHG  4) Normal duodenum    Assessment & Plan   Brett Palmer is a 53 year old year old male with medical history including Etoh abuse, Etoh cirrrhosis, HTN, SVT s/p ablation, appendectomy (03/2019)that presented for etoh withdrawal and diarrhea with black stools.     Acute Hospital Problems:   #   - Admit to IMU     #UGI Bleed  DDx: Esophagitis, gastritis, PUD, mallory wiess tear,  Esophageal varices   Patient has history of chronic etoh use with cirrhosis, now with diarrhea with tarry stools, +guiac positive indicative of UGIB. Given etoh history, vomiting, and his tarry stools likely due to irritation of esophagus, gastric and/or duodenal mucosa. He recently had a EGD on 10/2018 which was negative for esophageal or gastric varices, will need another EGD to evaluate for development of new varices, but less likely given lack of hematemesis. Patient may also have PUD as he has some epigastric pain on exam. Otherwise patient is afebrile and hemodynamically stable on exam with hgb within normal limits, though has a chronically low platelet count (100-129)   -Consult GI for EGD and recs  -NPO  -Avoid NSAIDs   -CBC daily   -type and cross     # Etoh Withdrawal   # Cirrhosis   Patient presented with withdrawal symptoms after failing outpatient therapy with librium taper. Patient reports history of w/d seizures. On exam vitals stable, A&Ox3, with bilateral UE tremors, and tongue fasciculations. Given etoh history will start patient on CIWA taper.   -CIWA protocol   -Thiamine 100mg  qday   - Folic Acid 1mg  daily   - multivitamin daily    - Zofran 4mg  q6hr prn     #Thrombocytopenia  (chronic)  Per patient report and chart review, patient with history of thrombocytopenia  100-129, likely related to cirrhosis. Aside from concern for UGIB as above patient is hemodynamically stable on exam without any skin lesions concerning for acute bleed.   -Continue to monitor   -CBC daily     #HTN   BP elevated on admission 146/78, previously on [place] 12.5mg  in the past but currently not on any medication.   -continue to monitor if consistently elevated consider restarting HTCZ if no contraindications vs another agent such as spironolactone given cirrhosis, once GI bleed is treated.    #Insomnia   Patient reports poor sleep over last 2 days in context of etoh withdrawal. Can consider trial of Vistaril 50mg  qhs with melatonin 5mg  qhs to assist with sleep. Likely will improve once etoh withdrawal symptoms resolve.   -Vistaril 50mg  qhs  -Melatonin 5mg  qhs     #PTSD  #Anxiety  Takes xanax 1mg  TID. Given etoh history can consider switching patient to a SSRI (Zolfot) or SNRI (Venlafaxine) for long term treatment of symptoms, and vistaril 50mg  QID for acute anxiety symptoms.   -continue to monitor   -CIWA protocol with ativan         #FEN/GI: No diet orders on file  #Prophylaxis: SCDs   # Code Status: History     This plan and alternatives have been discussed with the patient and/or surrogate.    Avel Peace, MD  PGY 2 Psychiatry/Family Medicine   Pager: [phone]

## 2019-04-06 NOTE — Interdisciplinary (Signed)
04/06/19 1410   Initial Assessment   CM Initial Assessment * Completed   CM attempted to meet with patient twice. Patient asleep both times post medication. CM will attempt at a later time.

## 2019-04-06 NOTE — ED Notes (Signed)
Dr. Garner Gavel at bedside to see pt.

## 2019-04-06 NOTE — Telephone Encounter (Signed)
FYI.Brett KitchenMarland KitchenCalled patient to assist in scheduling his follow up visit for Thursday Apr 07, 2019. He is currently in the hospital. Has a follow up visit with Dr. Salvadore Dom on next week.

## 2019-04-06 NOTE — ED Provider Notes (Signed)
Name Brett Palmer  DOB 17-Feb-1966  Age 53 year old    Chief Complaint      Chief Complaint   Patient presents with   . Withdrawal- Alcohol     Pt arrives to ED c/o ETOH withdrawals. Pt states had seizures last night, witnessed by wife. Per family, wife told him that his legs were shaking violently during seizure. Pt states waking up diaphoretic. vss in triage, no tremors noted. Pt states last took 25mg  Librium @ 1630 and 4mg  Xanax @ 1700.    . Abnormal Lab Results     Pt states family MD told pt he had low plt "70".       Patient was seen promptly with delayed note entry.     HPI:   Brett Palmer is a 53 year old male who presents with concerns about alcohol withdrawal.  Patient had a last drink approximately 3 days ago.  He had been seen in the interim as he had been having some tremors.  He was also some concern by his primary physician about frequent stooling and black stools over the past few months.  At the time of his previous evaluation CT of the abdomen was done which was negative.  There was some concern about alcohol withdrawal in the plan was for admission however the patient had declined at that time and returned home.  Then today in the past 24 hours he had noticed tremors in the legs and 1 port possible seizure activity.  His wife reported that he was having some confusion issues and was not making sense on occasion when he was speaking.  He is having a hard time spelling words that were usually easy for him to spell.  He was therefore referred back here by his primary physician for further evaluation and concerns for alcohol withdrawal.  Patient reports that he is able and willing to stay at this time.    Medical History          Past Medical History  Patient has a past medical history of Alcohol use, Gastroesophageal reflux disease, Hypertension, Migraine headache, PTSD (post-traumatic stress disorder) (1995-present), and SVT (supraventricular tachycardia) (CMS-HCC) (2011).    Past Surgical  History   has a past surgical history that includes Gallbladder surgery (2011).    Meds:  Prior to Admission Medications   Prescriptions Last Dose Informant Patient Reported? Taking?   ALPRAZolam (XANAX) 1 MG tablet   No No   Sig: Take 1 tablet (1 mg) by mouth 3 times daily as needed for Anxiety.   Esomeprazole Magnesium (NEXIUM PO)   Yes No   Sig: daily.   SUMAtriptan (IMITREX) 6 MG/0.5ML SOLN subcutaneous injection   No No   Sig: Inject 0.5 mL (6 mg) under the skin once for 1 dose.   albuterol 108 (90 BASE) MCG/ACT inhaler   No No   Sig: Inhale 2 puffs by mouth every 6 hours as needed for Wheezing.   chlordiazePOXIDE (LIBRIUM) 25 MG capsule   No No   Sig: Take 1 capsule (25 mg) by mouth every 6 hours for 3 days.   chlordiazePOXIDE (LIBRIUM) 25 MG capsule   No No   Sig: Take 1 capsule (25 mg) by mouth every 8 hours as needed for Anxiety.   dicyclomine (BENTYL) 10 MG capsule   No No   Sig: Take 1 capsule (10 mg) by mouth 3 times daily (before meals) for 5 days.   fluticasone propionate (FLONASE) 50 MCG/ACT  nasal spray   No No   Sig: Spray 2 sprays into each nostril daily.   hydrochlorothiazide (HYDRODIURIL) 12.5 MG tablet   No No   Sig: Take 1 tablet (12.5 mg) by mouth daily.   zolpidem (AMBIEN) 10 MG tablet   Yes No   Sig: Take 10 mg by mouth nightly as needed for Insomnia.      Facility-Administered Medications: None       Allergies: Compazine; Latex; Gabapentin; Norco [hydrocodone-acetaminophen]; and Vicodin [apap-fd&c yellow #10 [place]-hydrocodone]    Family History  Patient's family history includes Cancer in his father; Heart Disease in his maternal aunt and mother.    Social History  Patient reports that he quit smoking about 29 years ago. His smoking use included cigarettes. He has a 5.00 pack-year smoking history. He has never used smokeless tobacco. He reports current alcohol use. He reports that he does not use drugs.    Review of Systems     Comprehensive review of systems obtained and found to be  negative other than mentioned in HPI.    Physical Examination      04/05/19  2253 04/06/19  0208 04/06/19  0244   BP: 146/78 132/74    Pulse: 96 81    Resp: 18 18    Temp: 99 F (37.2 C)     SpO2: 96% 96% 94%       Physical Exam  Constitutional: well developed and nourished. non-toxic appearing.  Head: Normocephalic.  Atraumatic.  Eyes: no scleral icterus. pupils round and reactive to light.  Ears: Hearing grossly intact.  No traumatic lesions seen.  NMT: nose midline. moist membranes.  Neck: No midline bony step-off or crepitus.  Range of motion intact.  Respiratory: no respiratory distress. CTAB. no wheezes or rales.  Cardiovascular: normal rate. regular rhythm. no murmur. no cyanosis  Gastrointestinal: no distension. normoactive bowel sounds. Non tender  Musculoskeletal: no cyanosis. ranging neck normally. no traumatic lesions.  Generalized tremor noted particularly in the upper extremities.  No asterixis.  GU: deferred  Neurologic: awake. Alert. motor intact. no focal or lateralizing findings.  Equal hand grips.  Equal dorsal plantar flexion.  Skin: no jaundice or pallor. no petechia or purpura  Psychiatric: calm. normal affect.      Lab Results     Results for orders placed or performed during the hospital encounter of 49/67/59   Basic Metabolic Panel, Blood Green Plasma Separator Tube   Result Value Ref Range    Glucose 101 (H) 70 - 99 mg/dL    BUN 8 6 - 20 mg/dL    Creatinine 0.76 0.67 - 1.17 mg/dL    GFR >60 mL/min    Sodium 140 136 - 145 mmol/L    Potassium 3.8 3.5 - 5.1 mmol/L    Chloride 103 98 - 107 mmol/L    Bicarbonate 22 22 - 29 mmol/L    Anion Gap 15 7 - 15 mmol/L    Calcium 9.0 8.5 - 10.6 mg/dL   Liver Panel, Blood Green Plasma Separator Tube   Result Value Ref Range    Total Protein 7.1 6.0 - 8.0 g/dL    Albumin 4.4 3.5 - 5.2 g/dL    Bilirubin, Dir 0.6 (H) <0.2 mg/dL    Bilirubin, Tot 1.42 (H) <1.2 mg/dL    AST (SGOT) 117 (H) 0 - 40 U/L    ALT (SGPT) 52 (H) 0 - 41 U/L    Alkaline Phos 95 40 - 129  U/L  Lipase, Blood Green Plasma Separator Tube   Result Value Ref Range    Lipase 44 13 - 60 U/L   CBC w/ Diff Lavender   Result Value Ref Range    WBC 5.2 4.0 - 10.0 1000/mm3    RBC 4.48 (L) 4.60 - 6.10 mill/mm3    Hgb 13.8 13.7 - 17.5 gm/dL    Hct 41.0 40.0 - 50.0 %    MCV 91.5 79.0 - 95.0 um3    MCH 30.8 26.0 - 32.0 pgm    MCHC 33.7 32.0 - 36.0 g/dL    RDW 15.0 (H) 12.0 - 14.0 %    MPV 9.8 9.4 - 12.4 fL    Plt Count 85 (L) 140 - 370 1000/mm3    Segs 57 %    Lymphocytes 32 %    Monocytes 8 %    Eosinophils 2 %    Basophils 0 %    ANC-Automated 3.0 1.6 - 7.0 1000/mm3    Abs Lymphs 1.7 0.8 - 3.1 1000/mm3    Abs Monos 0.4 0.2 - 0.8 1000/mm3    Abs Eosinophils 0.1 <0.1 - 0.5 1000/mm3    Abs Basophils 0.0 <0.1 1000/mm3    Diff Type Automated    Urinalysis with Culture Reflex, when indicated   Result Value Ref Range    Type Not Specified     Color Yellow Yellow    Appearance Clear Clear    Specific Gravity 1.005 1.002 - 1.030    pH 8.0 5.0 - 8.0    Protein Negative Negative    Glucose Negative Negative    Ketones Negative Negative    Bilirubin Negative Negative    Blood Negative Negative    Urobilinogen 1+ (A) Negative    Nitrite Negative Negative    Leuk Esterase Negative Negative    WBC 0-2 0-2/HPF    RBC 0-2 0-2/HPF    Bacteria Rare None-Rare/HPF   Alcohol, Blood Green Plasma Separator Tube   Result Value Ref Range    Alcohol <11 None mg/dL   aPTT, Blood Blue   Result Value Ref Range    PTT 35 (H) 25 - 34 sec       Point of Care Results:        Radiology Results   X-ray Chest Single View    Result Date: 04/04/2019  Narrative: EXAM DESCRIPTION: X-RAY CHEST SINGLE VIEW CLINICAL HISTORY: Vomiting, hypoxic COMPARISON: 10/11/2018 FINDINGS/IMPRESSION: Single portable view of the chest limited with cut off of the right costophrenic angle. The cardiac silhouette and mediastinal contours are stable. The lungs are well expanded without airspace consolidation, effusion or pneumothorax. Linear scarring and/or subsegmental  atelectasis in the left lung base noted. Stable appearance of the regional skeleton.    Signed by: Valeda Malm 04/04/2019 12:31:12    Ct Abdomen And Pelvis With Contrast    Result Date: 04/04/2019  Narrative: EXAM DESCRIPTION: CT ABDOMEN AND PELVIS WITH CONTRAST CLINICAL HISTORY: History of penectomy months ago; diarrhea, right lower quadrant pain, fever TECHNIQUE: COVERAGE: Abdomen and pelvis IV CONTRAST: 100 ml Omnipaque 350 PHASE(S) ACQUIRED: Portal venous POSITIVE ORAL CONTRAST GIVEN: No ADVERSE EVENTS: None RECONSTRUCTIONS: Axial 3.81mm and sagittal/coronal 66mm Up-to-date CT equipment and radiation dose reduction techniques were employed. CTDIvol: 7.2 - 22.9 mGy. DLP: 7673 mGy-cm. COMPARISON: CT abdomen pelvis dated 10/11/2018 FINDINGS: Imaged lower thorax: Subsegmental atelectasis of lung bases. Minimal coronary artery calcification. LIVER:Hepatomegaly with relative low attenuation of the liver suggesting mild diffuse steatosis. Punctate density along the posterior aspect  of segment 6, may represent dropped gallstone. No associated soft tissue mass. BILIARY:Postsurgical changes of cholecystectomy; no biliary duct dilation. PANCREAS: Unremarkable SPLEEN: Borderline enlarged. ADRENAL GLANDS: Unremarkable KIDNEYS: Unremarkable STOMACH/DUODENUM: Unremarkable VASCULATURE: Unremarkable LYMPHATIC: Stable borderline enlarged retroperitoneal lymph node. SMALL & LARGE BOWEL: Scattered colonic diverticula; postsurgical changes appendectomy. Increased stranding of the ileocolic mesentery with fascial thickening, most likely postoperative changes. BLADDER/PELVIC ORGANS: Unremarkable BONES/SOFT TISSUES: Unremarkable OTHER: Small fat containing bilateral inguinal hernia. DOSE STATEMENT: "[employer] CT scanners employ modern techniques for CT dose reduction, including protocol review, automatic exposure control, and iterative reconstruction techniques. These features assure that radiation dose levels in CT  are optimized and are consistent with state-of-the-art, low dose CT practice."    Signed by: Mindi Junker 04/04/2019 12:05:04    Impression: IMPRESSION: CT scan of the abdomen and pelvis with IV contrast. 1. postsurgical changes of appendectomy; no evidence of intra-abdominal/pelvic abscesses or diverticulitis. 2. Increased right ileocolic mesentery stranding, most likely postoperative changes. 3. Hepatomegaly with findings suggestive of mild steatosis. No imaging features of advanced cirrhosis.           Work up Review     Workup Summary     There is no Midwife.          ED Course     Patient's laboratory examinations do show a nonspecific transaminitis consistent with his drinking.  Once again his platelets are noted to be low in the mid 80s.  Rectal exam was done and it was guaiac positive although likely so.  No stool in the vault at this time to evaluate color.  Patient reports this history of tremors at home which may or may not have been seizure activity.  This would be concerning for alcohol withdrawal.  Also of concern is this thrombocytopenia with signs of GI bleeding.  Therefore plan is for admission for further evaluation both of alcohol withdrawal and this GI bleed.  Case was discussed with the teaching service and they have accepted the patient for transfer sore to [facility].  Patient is placed on the CIWA protocol and is stable at time of transfer.      ED Meds:  Medications   LORazepam (ATIVAN) injection 1 mg (has no administration in time range)   thiamine (VITAMIN B1) tablet 100 mg (has no administration in time range)   folic acid (FOLVITE) tablet 1 mg (has no administration in time range)   promethazine (PHENERGAN) 12.5 mg in sodium chloride 0.9 % 50 mL IVPB (0 mg IntraVENOUS Completed 04/06/19 0239)       Procedures     Diagnosis       ICD-10-CM ICD-9-CM    1. Alcohol withdrawal syndrome with complication (CMS-HCC) M84.132 291.81    2. Thrombocytopenia (CMS-HCC) D69.6 287.5    3.  Gastrointestinal hemorrhage, unspecified gastrointestinal hemorrhage type K92.2 578.9          Medications Prescribed         Lessie Dings, MD  04/06/19 [phone]

## 2019-04-06 NOTE — ED Notes (Signed)
Endorsed care to Mindy RN.

## 2019-04-06 NOTE — ED Notes (Signed)
Report received from Kimberly RN. Assuming pt care.

## 2019-04-06 NOTE — ED Notes (Signed)
Report given to [facility] RN, pt transported by [facility] unit A3.

## 2019-04-06 NOTE — Plan of Care (Signed)
Problem: Promotion of Health and Safety  Goal: Promotion of Health and Safety  Description  The patient remains safe, receives appropriate treatment and achieves optimal outcomes (physically, psychosocially, and spiritually) within the limitations of the disease process by discharge.    Information below is the current care plan.  Outcome: Progressing  Flowsheets (Taken 04/06/2019 1856)  Guidelines: Inpatient Nursing Guidelines  Individualized Interventions/Recommendations #1: Monitor for etoh widrawal. CIWA assessment q 4 hrs. Ativan 1 mg po prn s/s of withdrawal  Individualized Interventions/Recommendations #2 (if applicable): Pt anxious to go home due feel claustrophobic with PTSD and will go AMA if not d/c. Paged [facility] on call and came to see pt. Will provide semi private room if no private room available.  Outcome Evaluation (rationale for progressing/not progressing) every shift: Pt neuro intact vss. CIWA monitoring continues with prn intervention of PO Ativan. Pt ok'd to stay after MD spoke with pt and provision of semi private room after shift changed. EGD cancelled back to po status with regular diet ordered. IVF dcd. Drinking adequate amount of fluid voiding freely quantity sufficient. Ambuted to the bathroom prn brp. Gait steady.

## 2019-04-06 NOTE — Consults (Signed)
Hepatology Initial Consult Note  Consult Attending: Franne Grip, MD  Consult NP:  Brayton Mars, NP  Current Attending: [name], MD  Date: 04/06/19    Patient Name: Brett Palmer , MRN: [id number]    Hospital Day:   0 days - Admitted on: 04/06/2019    Reason for consultation: melena at home     History of Present Illness: Court Gracia is a 53 year old male with hx of heavy alcohol abuse, obesity, PTSD, SVT s/p ablation, compensated EtOH cirrhosis (based on MRE of 11 kPa and non-invasive serologic assessment, FIB4/APRI), present with ETOH withdrawal and diarrhea with black stools. Hepatology consulted for c/f GIB. Patient states he has been having diarrhea with melena for the past few weeks. Denies nausea/vomiting, hematemesis, hematochezia, juandice, or confusion. Last drink  Was 05/03. Denies recent NSAID use. Of note, recently had appendectomy in [place] 3 weeks ago.    Recently seen in ED 05/04 with right lower abdominal pain, n/v, fevers, and diarrhea. Had CTAP with postsurgical changes of appendectomy without evidence of an acute process. Symptoms improved and patient was discharged with librium taper. Last drink was 05/03.         Past Medical History:  Past Medical History:   Diagnosis Date   . Alcohol use    . Gastroesophageal reflux disease    . Hypertension    . Migraine headache     part of gulf war syndrome   . PTSD (post-traumatic stress disorder) 1995-present    treated by Dr. Joen Laura at [facility]    . SVT (supraventricular tachycardia) (CMS-HCC) 2011       Past Surgical History:   Past Surgical History:   Procedure Laterality Date   . GALLBLADDER SURGERY  2011       Allergies:  Allergies   Allergen Reactions   . Compazine Anxiety     Tolerates phenergan and zofran   . Latex Rash   . Gabapentin Other     "crawl out of my skin"   . Norco [Hydrocodone-Acetaminophen] Itching   . Vicodin [Apap-Fd&C Yellow #10 [place]-Hydrocodone] Anxiety     Tolerates morphine and dilaudid       Home  Medications:  Medications Prior to Admission   Medication Sig Dispense Refill Last Dose   . albuterol 108 (90 BASE) MCG/ACT inhaler Inhale 2 puffs by mouth every 6 hours as needed for Wheezing. 1 Inhaler 2 04/06/2019   . ALPRAZolam (XANAX) 1 MG tablet Take 1 tablet (1 mg) by mouth 3 times daily as needed for Anxiety. 12 tablet 0 04/06/2019   . chlordiazePOXIDE (LIBRIUM) 25 MG capsule Take 1 capsule (25 mg) by mouth every 8 hours as needed for Anxiety. 6 capsule 0 04/06/2019   . chlordiazePOXIDE (LIBRIUM) 25 MG capsule Take 1 capsule (25 mg) by mouth every 6 hours for 3 days. 12 capsule 0 04/06/2019   . Esomeprazole Magnesium (NEXIUM PO) daily.   Past Month   . fluticasone propionate (FLONASE) 50 MCG/ACT nasal spray Spray 2 sprays into each nostril daily. 1 bottle 11 Past Month   . hydrochlorothiazide (HYDRODIURIL) 12.5 MG tablet Take 1 tablet (12.5 mg) by mouth daily. 30 tablet 1 Past Month   . zolpidem (AMBIEN) 10 MG tablet Take 10 mg by mouth nightly as needed for Insomnia.   Past Month       Inpatient Medications:  Scheduled Meds  . folic acid  1 mg Daily   . melatonin  5 mg HS   .  pantoprazole  40 mg Q12H NR   . sodium chloride  3 mL Q8H   . [START ON 04/07/2019] thiamine  100 mg Daily     IV Meds  . sodium chloride     . sodium chloride 125 mL/hr at 04/06/19 0938       Family History:  Family History   Problem Relation Name Age of Onset   . Cancer Father          melanoma, died at age 3   . Heart Disease Mother          CHF, thought to have passed from MI   . Heart Disease Maternal Aunt          CHF       Social History:   Social History     Socioeconomic History   . Marital status: Married     Spouse name: [name]   . Number of children: 2   . Years of education: Not on file   . Highest education level: Not on file   Occupational History   . 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 Smoking status: Former Smoker     Packs/day: 0.50     Years: 10.00     Pack years: 5.00     Types:  Cigarettes     Last attempt to quit: 01/11/1990     Years since quitting: 29.2   . Smokeless tobacco: Never Used   Substance and Sexual Activity   . Alcohol use: Yes     Comment: down to 1/2-3/4 bottle of wine   . Drug use: No     Comment: Denies MJ use   . Sexual activity: Yes     Partners: Female   Social Activities of Daily Living Present   . Not on file   Social History Narrative   . Not on file       Review of Systems: -   Constitutional: Denies fevers, chills, weight loss  ENT: negative for eye changes, oral lesions, difficulty swallowing  Cardiac: Denies chest pain or palpitations  Lungs: Denies cough or shortness of breath  GI: Negative except noted in the HPI  GU: Denies dysuria, hematuria  Skin: Denies rashes or other skin lesions  MSK: Denies joint deformities, new joint pain  Lymph: Denies enlarged nodes, night sweats, abnormal bleeding  Psych: negative for depressed mood, anxiety  Neuro: negative for confusion, weakness, sensory changes    OBJECTIVE:     Vitals signs:     Latest Entry  Range (last 24 hours)    Temperature: 97.8 F (36.6 C)  Temp  Avg: 98 F (36.7 C)  Min: 97.1 F (36.2 C)  Max: 99 F (37.2 C)    Blood pressure (BP): 142/88  BP  Min: 122/51  Max: 146/78    Heart Rate: 81  Pulse  Avg: 82.3  Min: 76  Max: 96    Respirations: 13  Resp  Avg: 16.7  Min: 13  Max: 18    SpO2: 98 %  SpO2  Avg: 96.4 %  Min: 94 %  Max: 98 %       No data recorded     Weight: 121 kg (266 lb 12.8 oz)  Percentage Weight Change (%): 0 %    Respiratory support:  Oxygen Therapy  SpO2: 98 %  O2 Device: None (Room air)       Physical Exam:  General:  Well developed, well nourished in no apparent distress.  HEENT:  No scleral icterus, Trachea midline, mucus membranes moist.  Lungs: Normal respiratory effort.  Abdomen:  Abdomen soft, non tender, bowel sound present. No peritoneal signs.  Extremities:  No peripheral edema. Warm well-perfused.  Skin:  Non-icteric and no visible rash  Neuro:  AOX3, No asterixis  Psych:   Normal affect.      Labs:     CBC  Recent Labs     04/06/19  0124 04/06/19  0618   WBC 5.2 4.9   HGB 13.8 14.1   HCT 41.0 42.4   PLT 85* 86*   SEG 57 49   LYMPHS 32 39   MONOS 8 10        Chemistry  Recent Labs     04/04/19  1427 04/06/19  0124 04/06/19  0619   NA 138 140 140   K 4.2 3.8 3.9   CL 101 103 106   BICARB _0 BUN _1 CREAT 0.77 0.76 0.67   GLU 119* 101* 95    9.0 9.0 9.0   MG 2.4  --  2.1   PHOS 2.4*  --  3.9     Recent Labs     04/04/19  1039 04/06/19  0124 04/06/19  0619   ALK 97 95 91   AST 122* 117* 109*   ALT 52* 52* 49*   TBILI 1.82* 1.42* 1.50*   DBILI 0.6* 0.6*  --    ALB 4.7 4.4 4.1          Coags  Recent Labs     04/04/19  1039 04/06/19  0124 04/06/19  0619   PT 13.7*  --  13.6*   PTT  --  35* 35*   INR 1.3  --  1.2       Micro      Radiology:   CTAP 04/04/19   FINDINGS:  Imaged lower thorax: Subsegmental atelectasis of lung bases. Minimal coronary artery calcification.  LIVER:Hepatomegaly with relative low attenuation of the liver suggesting mild diffuse steatosis. Punctate density along the posterior aspect of segment 6, may represent dropped gallstone. No associated soft tissue mass.  BILIARY:Postsurgical changes of cholecystectomy; no biliary duct dilation.  PANCREAS: Unremarkable  SPLEEN: Borderline enlarged.  ADRENAL GLANDS: Unremarkable  KIDNEYS: Unremarkable  STOMACH/DUODENUM: Unremarkable  VASCULATURE: Unremarkable  LYMPHATIC: Stable borderline enlarged retroperitoneal lymph node.  SMALL & LARGE BOWEL: Scattered colonic diverticula; postsurgical changes appendectomy. Increased stranding of the ileocolic mesentery with fascial thickening, most likely postoperative changes.  BLADDER/PELVIC ORGANS: Unremarkable  BONES/SOFT TISSUES: Unremarkable  OTHER: Small fat containing bilateral inguinal hernia.    IMPRESSION:  CT scan of the abdomen and pelvis with IV contrast.    1. postsurgical changes of appendectomy; no evidence of intra-abdominal/pelvic abscesses or  diverticulitis.    2. Increased right ileocolic mesentery stranding, most likely postoperative changes.    3. Hepatomegaly with findings suggestive of mild steatosis. No imaging features of advanced cirrhosis.    Prior Endoscopy:   EGD 10/2018   FINDINGS  1) No esophageal or gastric varices  2) Normal GE junction at 40cm  3) Mild PHG  4) Normal duodenum    ENDOSCOPIC DIAGNOSIS  1) No esophageal or gastric varices  2) Normal GE junction at 40cm  3) Mild PHG  4) Normal duodenum    RECOMMENDATIONS  1) Repeat EGD in 3 years    Colonoscopy 05/2018  CF-h190 with cap. Good prep. normal terminal ileum. 90m sessile cecal polyp resected with cold snare. 535msessile trasnverse colon polyp resected with cold snare. Two 3-73m73messile sigmoid colon polyp removed with cold forceps.    ENDOSCOPIC DIAGNOSIS  Four small polyps removed  RECOMMENDATIONS  -likely repeat colonoscopy in 3-5 years based on biopsy results  -take miralax once a day for 5 days and if you are still constipated you can go up to 2 or 3 times a  day until you are having 1-2 good bowel movements per day  -avoid NSAIDs for a week    ASSESSMENT / PLAN:   MarCalab Sachse a [age]ar old male with hx of heavy alcohol abuse, obesity, PTSD, SVT s/p ablation, compensated EtOH cirrhosis (based on MRE of 11 kPa and non-invasive serologic assessment, FIB4/APRI), present with ETOH withdrawal and diarrhea with black stools. Hepatology consulted for c/f GIB.    # possible GIB: presenting with melena for few weeks, hb stable at baseline ~13, last EGD 10/2018 with no EV, colonoscopy 05/2018 with four small polyps removed. DRE done at bedside with brown stools.   - IV PPI BID  - continue to trend hb and transfuse for hb <7  - as DRE negative for bleed, hb stable and HDS defer endoscopic intervention at this time  - please page hepatology if patient develops overt GIB   - ok to start on PO diet     # diarrhea  - please send GIP     # alcohol abuse  - CIWA per  primary  - Discussed progression of cirrhosis/risk of decompensation with ongoing use    # EtOH Cirrhosis: cirrhosis based on biochemical assessment and MRE  MELD-Na score: 10 at 04/06/2019  6:19 AM  MELD score: 10 at 04/06/2019  6:19 AM  Calculated from:  Serum Creatinine: 0.67 mg/dL (Rounded to 1 mg/dL) at 04/06/2019  6:19 AM  Serum Sodium: 140 mmol/L (Rounded to 137 mmol/L) at 04/06/2019  6:19 AM  Total Bilirubin: 1.50 mg/dL at 04/06/2019  6:19 AM  INR(ratio): 1.2 at 04/06/2019  6:19 AM  Age: 87 years     - daily LFT and INR  *Ascites: none  *Hepatic encephalopathy: none, no hx of HE   * Variceal surveillance: last EGD 10/2018 with no EV   * Hepatocellular carcinoma surveillance: CTAP 05/04 with no hepatic mass   - please send AFP, AFP L3% and DCP    OLT: not a candidate in the setting of active drinking and low MELD        Recommendations at preliminary. Patient to be discussed with attending Dr. KonPosey Pronto    AnnVerlin Festerepatology NP  #[phone]

## 2019-04-07 ENCOUNTER — Other Ambulatory Visit: Payer: Self-pay

## 2019-04-07 ENCOUNTER — Telehealth (INDEPENDENT_AMBULATORY_CARE_PROVIDER_SITE_OTHER): Payer: Self-pay | Admitting: Family Medicine

## 2019-04-07 DIAGNOSIS — F419 Anxiety disorder, unspecified: Secondary | ICD-10-CM

## 2019-04-07 DIAGNOSIS — K922 Gastrointestinal hemorrhage, unspecified: Secondary | ICD-10-CM

## 2019-04-07 DIAGNOSIS — D696 Thrombocytopenia, unspecified: Secondary | ICD-10-CM

## 2019-04-07 LAB — BASIC METABOLIC PANEL, BLOOD
Anion Gap: 12 mmol/L (ref 7–15)
BUN: 6 mg/dL (ref 6–20)
Bicarbonate: 24 mmol/L (ref 22–29)
Calcium: 9.3 mg/dL (ref 8.5–10.6)
Chloride: 104 mmol/L (ref 98–107)
Creatinine: 0.71 mg/dL (ref 0.67–1.17)
GFR: >60 mL/min
Glucose: 79 mg/dL (ref 70–99)
Potassium: 3.8 mmol/L (ref 3.5–5.1)
Sodium: 140 mmol/L (ref 136–145)

## 2019-04-07 LAB — CBC WITH DIFF, BLOOD
ANC-Automated: 2.1 10*3/uL (ref 1.6–7.0)
Abs Basophils: 0 10*3/uL (ref <0.1)
Abs Eosinophils: 0.1 10*3/uL (ref 0.1–0.5)
Abs Lymphs: 1.6 10*3/uL (ref 0.8–3.1)
Abs Monos: 0.4 10*3/uL (ref 0.2–0.8)
Basophils: 0 %
Eosinophils: 3 %
Hct: 43.1 % (ref 40.0–50.0)
Hgb: 14 gm/dL (ref 13.7–17.5)
Lymphocytes: 38 %
MCH: 30.7 pg (ref 26.0–32.0)
MCHC: 32.5 g/dL (ref 32.0–36.0)
MCV: 94.5 um3 (ref 79.0–95.0)
MPV: 10 fL (ref 9.4–12.4)
Monocytes: 9 %
Plt Count: 87 10*3/uL — ABNORMAL LOW (ref 140–370)
RBC: 4.56 10*6/uL — ABNORMAL LOW (ref 4.60–6.10)
RDW: 15 % — ABNORMAL HIGH (ref 12.0–14.0)
Segs: 50 %
WBC: 4.2 10*3/uL (ref 4.0–10.0)

## 2019-04-07 LAB — PHOSPHORUS, BLOOD: Phosphorous: 4.5 mg/dL (ref 2.7–4.5)

## 2019-04-07 LAB — MRSA SURVEILLANCE CULTURE

## 2019-04-07 LAB — PROTHROMBIN TIME, BLOOD
INR: 1.2
PT,Patient: 14.2 s — ABNORMAL HIGH (ref 9.7–12.5)

## 2019-04-07 LAB — MAGNESIUM, BLOOD: Magnesium: 2 mg/dL (ref 1.6–2.6)

## 2019-04-07 LAB — ALPHA FETOPROTEIN, BLOOD: AFP: 6 ng/mL (ref 0.0–8.3)

## 2019-04-07 MED ORDER — PRAZOSIN HCL 1 MG OR CAPS
1.0000 mg | ORAL_CAPSULE | Freq: Every evening | ORAL | 0 refills | Status: DC
Start: 2019-04-07 — End: 2019-09-22
  Filled 2019-04-07: qty 7, 7d supply, fill #0

## 2019-04-07 MED ORDER — FOLIC ACID 1 MG OR TABS
1.0000 mg | ORAL_TABLET | Freq: Every day | ORAL | 0 refills | Status: DC
Start: 2019-04-08 — End: 2019-09-22
  Filled 2019-04-07: qty 30, 30d supply, fill #0

## 2019-04-07 MED ORDER — CLONAZEPAM 1 MG OR TABS
1.0000 mg | ORAL_TABLET | Freq: Three times a day (TID) | ORAL | 0 refills | Status: DC
Start: 2019-04-07 — End: 2019-05-04
  Filled 2019-04-07: qty 6, 2d supply, fill #0

## 2019-04-07 MED ORDER — LANSOPRAZOLE 30 MG OR CPDR
30.0000 mg | DELAYED_RELEASE_CAPSULE | Freq: Two times a day (BID) | ORAL | Status: DC
Start: 2019-04-07 — End: 2019-04-07

## 2019-04-07 MED ORDER — OMEPRAZOLE 20 MG OR CPDR
20.0000 mg | DELAYED_RELEASE_CAPSULE | Freq: Two times a day (BID) | ORAL | 1 refills | Status: AC
Start: 2019-04-07 — End: ?
  Filled 2019-04-07: qty 60, 30d supply, fill #0

## 2019-04-07 MED ORDER — OMEPRAZOLE 20 MG OR CPDR
20.0000 mg | DELAYED_RELEASE_CAPSULE | Freq: Two times a day (BID) | ORAL | 0 refills | Status: DC
Start: 2019-04-07 — End: 2019-04-07
  Filled 2019-04-07: qty 60, 30d supply, fill #0

## 2019-04-07 MED ORDER — VITAMIN B-1 100 MG OR TABS
100.0000 mg | ORAL_TABLET | Freq: Every day | ORAL | 0 refills | Status: AC
Start: 2019-04-08 — End: 2019-05-07
  Filled 2019-04-07: qty 30, 30d supply, fill #0

## 2019-04-07 NOTE — Telephone Encounter (Signed)
General Inquiry     Who is calling: Pharmacy     Reason for this call: Pharmacist called stating that pt has picked up Benzo from other pharmacy and is wanting to know if its still ok to fill RX for chlordiazePOXIDE (LIBRIUM) 25 MG capsule         Action required by office: Please contact caller     Duplicate encounter? No previous documentation found on this issue.     Best way to contact: [phone]  Alternative:     Inquiry has been read verbatim to this caller. Verbalizes satisfaction and confirms the above is accurate: yes    Has been advised this message will be transmitted to office and can expect a response within the next 24-72 hours.    Encounter created by Care Assist MA.  If further action required please route encounter to appropriate in clinic MA/LVN/Resident Pool

## 2019-04-07 NOTE — Progress Notes (Signed)
INPATIENT HEPATOLOGY/GASTROENTEROLOGY PROGRESS NOTE   Date:  04/07/19   Author: Verlin Fester, NP    Current Hospital Stay:   1 day - Admitted on: 04/06/2019    Overnight Events/Subjective:  - hb stable 14, denies melena or abdominal pain     Medications:  . clonazePAM  1 mg R5J   . folic acid  1 mg Daily   . lansoprazole  30 mg BID AC   . melatonin  5 mg HS   . sodium chloride  3 mL Q8H   . thiamine  100 mg Daily       Objective:    Vital Signs:  Temperature:  [97.6 F (36.4 C)-98.8 F (37.1 C)] 97.8 F (36.6 C) (05/07 0400)  Blood pressure (BP): (130-146)/(44-92) 139/85 (05/07 0840)  Heart Rate:  [68-85] 79 (05/07 0840)  Respirations:  [11-23] 17 (05/07 0840)  Pain Score: 0 (05/07 0600)  O2 Device: None (Room air) (05/07 0600)  SpO2:  [90 %-99 %] 99 % (05/07 0840)    Intake/Output (Current Shift):  05/06 0600 - 05/07 0559  In: 1991 [P.O.:910; I.V.:1081]  Out: 700 [Urine:700]    Physical Exam:  General:  Well developed, well nourished in no apparent distress.  HEENT:  No scleral icterus, Trachea midline, mucus membranes moist.  Lungs: Normal respiratory effort.  Abdomen:  Abdomen soft, non tender, bowel sound present. No peritoneal signs.  Extremities:  No peripheral edema. Warm well-perfused.  Skin:  Non-icteric and no visible rash  Neuro:  AOX3, No asterixis  Psych:  Normal affect.    Laboratory data:   Recent Labs     04/04/19  1039 04/04/19  1427 04/06/19  0124 04/06/19  0619 04/07/19  0551   NA 137 138 140 140 140   K 4.3 4.2 3.8 3.9 3.8   CL 98 101 103 106 104   BICARB 20* '23 22 23 24   '$ BUN '7 6 8 7 6   '$ CREAT 0.86 0.77 0.76 0.67 0.71   [place] 9.5 9.0 9.0 9.0 9.3   MG  --  2.4  --  2.1 2.0   PHOS  --  2.4*  --  3.9 4.5   TP 8.0  --  7.1 7.5  --    ALB 4.7  --  4.4 4.1  --    TBILI 1.82*  --  1.42* 1.50*  --    DBILI 0.6*  --  0.6*  --   --    AST 122*  --  117* 109*  --    ALT 52*  --  52* 49*  --    ALK 97  --  95 91  --        Recent Labs     04/06/19  0124 04/06/19  0618 04/07/19  0551   WBC 5.2 4.9 4.2   HGB  13.8 14.1 14.0   HCT 41.0 42.4 43.1   MCV 91.5 94.0 94.5   PLT 85* 86* 87*   SEG 57 49 50   LYMPHS 32 39 38   MONOS '8 10 9   '$ EOS '2 2 3       '$ Recent Labs     04/04/19  1039 04/06/19  0124 04/06/19  0619 04/07/19  0551   PTT  --  35* 35*  --    INR 1.3  --  1.2 1.2       Imaging/Studies:  CTAP 04/04/19   FINDINGS:  Imaged lower thorax: Subsegmental atelectasis  of lung bases. Minimal coronary artery calcification.  LIVER:Hepatomegaly with relative low attenuation of the liver suggesting mild diffuse steatosis. Punctate density along the posterior aspect of segment 6, may represent dropped gallstone. No associated soft tissue mass.  BILIARY:Postsurgical changes of cholecystectomy; no biliary duct dilation.  PANCREAS: Unremarkable  SPLEEN: Borderline enlarged.  ADRENAL GLANDS: Unremarkable  KIDNEYS: Unremarkable  STOMACH/DUODENUM: Unremarkable  VASCULATURE: Unremarkable  LYMPHATIC: Stable borderline enlarged retroperitoneal lymph node.  SMALL & LARGE BOWEL: Scattered colonic diverticula; postsurgical changes appendectomy. Increased stranding of the ileocolic mesentery with fascial thickening, most likely postoperative changes.  BLADDER/PELVIC ORGANS: Unremarkable  BONES/SOFT TISSUES: Unremarkable  OTHER: Small fat containing bilateral inguinal hernia.    IMPRESSION:  CT scan of the abdomen and pelvis with IV contrast.    1. postsurgical changes of appendectomy; no evidence of intra-abdominal/pelvic abscesses or diverticulitis.    2. Increased right ileocolic mesentery stranding, most likely postoperative changes.    3. Hepatomegaly with findings suggestive of mild steatosis. No imaging features of advanced cirrhosis.    Prior Endoscopy:   EGD 10/2018   FINDINGS  1) No esophageal or gastric varices  2) Normal GE junction at 40cm  3) Mild PHG  4) Normal duodenum    ENDOSCOPIC DIAGNOSIS  1) No esophageal or gastric varices  2) Normal GE junction at 40cm  3) Mild PHG  4) Normal duodenum    RECOMMENDATIONS  1) Repeat  EGD in 3 years    Colonoscopy 05/2018     CF-h190 with cap. Good prep. normal terminal ileum. 65m sessile cecal polyp resected with cold snare. 531msessile trasnverse colon polyp resected with cold snare. Two 3-17m9messile sigmoid colon polyp removed with cold forceps.    ENDOSCOPIC DIAGNOSIS  Four small polyps removed  RECOMMENDATIONS  -likely repeat colonoscopy in 3-5 years based on biopsy results  -take miralax once a day for 5 days and if you are still constipated you can go up to 2 or 3 times a  day until you are having 1-2 good bowel movements per day  -avoid NSAIDs for a week    ASSESSMENT / PLAN:   Brett Palmer a [age]ar old male with hx of heavy alcohol abuse, obesity, PTSD, SVT s/p ablation, compensated EtOH cirrhosis (based on MRE of 11 kPa and non-invasive serologic assessment, FIB4/APRI), present with ETOH withdrawal and diarrhea with black stools. Hepatology consulted for c/f GIB.    # possible GIB: presenting with melena for few weeks, hb stable at baseline ~13, last EGD 10/2018 with no EV, colonoscopy 05/2018 with four small polyps removed. DRE done at bedside with brown stools.   - continue PO PPI BID  - continue to trend hb and transfuse for hb <7  - as DRE negative for bleed, hb stable and HDS defer endoscopic intervention at this time  - please page hepatology if patient develops overt GIB   - ok to start 2g Na diet with Boost TID     # diarrhea  - please send GIP if having diarrhea      # alcohol abuse  - CIWA per primary  - Discussed progression of cirrhosis/risk of decompensation with ongoing use  - recommend referral to Psych for PTSD treatment as patient say he drinks due to on-going PTSD     # EtOH Cirrhosis: cirrhosis based on biochemicalassessment and MRE  MELD-Na score: 10 at 04/07/2019  5:51 AM  MELD score: 10 at 04/07/2019  5:51 AM  Calculated from:  Serum  Creatinine: 0.71 mg/dL (Rounded to 1 mg/dL) at 04/07/2019  5:51 AM  Serum Sodium: 140 mmol/L (Rounded to 137 mmol/L) at  04/07/2019  5:51 AM  Total Bilirubin: 1.50 mg/dL at 04/06/2019  6:19 AM  INR(ratio): 1.2 at 04/07/2019  5:51 AM  Age: 73 years    - daily LFT and INR  *Ascites: none  *Hepatic encephalopathy: none, no hx of HE   * Variceal surveillance: last EGD 10/2018 with no EV   * Hepatocellular carcinoma surveillance: CTAP 05/04 with no hepatic mass   - f/u on AFP, AFP L3% and DCP    OLT: not a candidate in the setting of active drinking and low MELD      Hepatology will be signing off at this time, please call or page with any questions or concerns. Patient is scheduled for Hepatology follow-up appointment 05/20 at 9:30 AM at [facility].    Patient was seen and discussed with attending Dr. Posey Pronto.      Verlin Fester  Hepatology NP  [phone]

## 2019-04-07 NOTE — Telephone Encounter (Signed)
Called pharmacy to cancel Librium per Dr. Salvadore Dom, [name].

## 2019-04-07 NOTE — Telephone Encounter (Signed)
Front desk attempt to contact and schedule   Patient with a follow up my chart video visit there was no answer lvm.

## 2019-04-07 NOTE — Discharge Instructions (Signed)
Diagnosis and Reason for Admission    You were admitted to the hospital for the following reason(s):  Alcohol withdrawal and GI bleed     Your full diagnosis list is located on this After Visit Summary in the Hospital Problems section.    What [facility] Hospital Stay    The main tests and treatments done for you during this hospitalization were:    Abdominal ultrasound and alcohol withdrawal management     The following evaluation is still important to complete after discharge from the hospital:  Follow up at the [facility] clinic in 1-2 days. Please call and schedule a video appointment.   Follow up with Hepatology, last time you saw Dr. Amalia Greenhouse.  Follow up with [facility], a referral has been placed.  Please take you Prilosec/Omeprazole twice daily for 8 weeks.     Instructions for After Discharge    Your diet at home should be a low-salt diet. Do not take NSAIDs which include advil and aleve, this may worsen your bleeding.     Your activity level at home should be:  regular activity.    Specific activity restrictions:    None    Wound or tube care instructions:  None    Your medication list is located on this After Visit Summary in the Current Discharge Medication List section.  Your nurse will review this information with you before you leave the hospital.    It is very important for you to keep a current medication list with you in order to assist your doctors with your medical care.  Bring this After Visit Summary with you to your follow up appointments.    Reasons to Contact a Doctor Urgently    Call 911 or return to the hospital immediately if:  You have another seizures, become disoriented or confused, or if you lose consciousness. If you develop chest pain or shortness of breath.     If you have any questions about your hospital care, your medications, or if you have new or concerning symptoms soon after going home from the hospital, and you need to contact your hospital  physician, your hospital physician can be contacted in the following manner:  [facility] operator at [phone].    Once you are able to see your primary care physician (PCP), your PCP will then be responsible for further medication refills, or appointment referrals.    What Needs to Happen Next After Discharge -- Appointments and Follow Up    Any appointments already scheduled at [facility] clinics will be listed in the Future Appointments section at the top of this After Visit Summary.  Any appointments that have been requested, but have not yet been scheduled, will be listed below that under Post Discharge Referrals.    Sometimes tests performed in the hospital do not yet have results by the time a patient goes home.  The following key tests will need to be followed up at your next appointment: None    Medical Home Information    Your primary care provider or clinic currently on file at [facility] is: Salvadore Dom, Carie Caddy  You currently have an advance directive or living will on file at [facility]: Yes    Handouts Given to You (if applicable)

## 2019-04-07 NOTE — Plan of Care (Signed)
Problem: Promotion of Health and Safety  Goal: Promotion of Health and Safety  Description  The patient remains safe, receives appropriate treatment and achieves optimal outcomes (physically, psychosocially, and spiritually) within the limitations of the disease process by discharge.    Information below is the current care plan.  Outcome: Progressing  Flowsheets  Taken 04/06/2019 2251  Patient /Family stated Goal: To To get situated and go home tomorrow before noon  Taken 04/07/2019 1610  Guidelines: Inpatient Nursing Guidelines  Individualized Interventions/Recommendations #1: Pt w/ withdrawals. Monitor CIWA Q4 and administer ativan for CIWA for >8.  Individualized Interventions/Recommendations #2 (if applicable): Reinforce need to call for assistance before getting up OOB to ambulate to bathroom to be unhooked form cords.  Individualized Interventions/Recommendations #3 (if applicable): Do not administer red ruices or red dye foods as pt was experiencing melena.  Outcome Evaluation (rationale for progressing/not progressing) every shift: Pt AOx4 and eager to be discharged before noon as that is the only time his wife can pick him up. Pt with only complaint of mild headache overnight but did not want pain meds. Stated he just wanted to sleep it off. Warm shampoo cap helped. No ativan administered for CIWA. Pt able to ambulate w/ steady gait to restroom overnight. Will continue to monitor.

## 2019-04-07 NOTE — Discharge Summary (Signed)
Date of Admission:  04/06/2019  Date of Discharge:  04/07/19    Patient Name:  Brett Palmer [facility]    Principal Diagnosis (required): ETOH withdrawal    Hospital Problem List (required):  Active Hospital Problems    Diagnosis   . *Alcohol withdrawal syndrome with complication (CMS-HCC) [P95.093]   . Thrombocytopenia (CMS-HCC) [D69.6]   . Gastrointestinal hemorrhage, unspecified gastrointestinal hemorrhage type [K92.2]   . Alcoholic cirrhosis of liver without ascites (CMS-HCC) [K70.30]   . Transaminitis [R74.0]   . PTSD (post-traumatic stress disorder) [F43.10]   . Anxiety [F41.9]      Resolved Hospital Problems   No resolved problems to display.       Additional Hospital Diagnoses ("rule out" or "suspected" diagnoses, etc.):     Covid - negative  Active GI Bleed - Hemoglobin was stable, no signs of overt bleed this admission, rectal exam negative for bleed     Principal Procedure During This Hospitalization (required):  EKG/Echo:  04/04/19  VR 116, PR 170, QRS 80, QT/QTc 328/455  Sinus tachycardia  Nonspecific ST and T wave abnormality    Ct Abdomen And Pelvis With Contrast  Result Date: 04/04/2019  IMPRESSION: CT scan of the abdomen and pelvis with IV contrast. 1. postsurgical changes of appendectomy; no evidence of intra-abdominal/pelvic abscesses or diverticulitis. 2. Increased right ileocolic mesentery stranding, most likely postoperative changes. 3. Hepatomegaly with findings suggestive of mild steatosis. No imaging features of advanced cirrhosis.     EGD 11/17/2018  FINDINGS/Diagnosis  1) No esophageal or gastric varices  2) Normal GE junction at 40cm  3) Mild PHG  4) Normal duodenum    Other Procedures Performed During This Hospitalization (required):  As above    Procedure results are available in Chart Review in Epic.  For those providers external to [place], the key procedure results are listed below:  As above    Consultations Obtained During This Hospitalization:  Hepatology    Key consultant  recommendations:  Hepatology 5/7  - continue PO PPI BID  - continue to trend hb and transfuse for hb <7  - as DRE negative for bleed, hb stable and HDS defer endoscopic intervention at this time  - please page hepatology if patient develops overt GIB  - ok to start 2g Na diet with Boost TID   - CIWA per primary  -Discussed progression of cirrhosis/risk of decompensation with ongoing use  - recommend referral to Psych for PTSD treatment as patient say he drinks due to on-going PTSD   We will be setting up outpatient Hepatology follow-up appointment.    Reason for Admission to the Hospital / History of Present Illness:  Brett Palmer is a 53 year old male with a medical history including Etoh abuse, Etoh cirrrhosis, HTN, SVT s/p ablation, appendectomy (03/2019)that presented for etoh withdrawal and diarrhea with black stools.     Per chart review, patient was evaluated in ED 04/04/19 for RLQ abdominal pain, fevers, n/v, poor PO intake and diarrhea that occurred for a few days last week in context of recent appendectomy followed by antibiotic course. During ED visit CT abdomen was done significant for RLQ postsurgical changes of appendectomy without evidence of an acute process. His symptoms improved and he was able to tolerate PO, however given his etoh withdrawal symptoms he was offered inpatient admission but he declined due to scheduling conflict. Instead patient was sent home with librium taper and close PCP follow up.    04/05/19 patient had interview with CIWA  6, however patient felt Librium dosage 25mg  QID was not effective. Patient reported symptoms of shaking at night felt to be seizures and extreme anxiety. Per PCP note she was willing to increase librium to 50mg  TID x1day, then 25mg  q6hrs for taper. However patient called into triage line later in the evening given continued anxiety,  sweating, fogginess, word spelling difficulty, and concern for further withdrawal symptoms including seizure. During  triage call patient states he was unaware of medication adjustment of Librium 50mg  TID, and continued to take librium 25mg  q6hrs with last dosage around 3-4pm. His last drink was 04/03/19 afternoon, stated he has rearranged his schedule so that he can be admitted to the hospital for w/d symptoms. Per his ihealth application heart rate 941-740, pulse ox 86-91, BP unable to take it (battery died), T 98.6, these vitals were taken 930pm (prior to triage call).    Other symptoms include nausea but denies vomiting, has some PO intake with food and at least 1.5L of water today. States SOB and dizzy ambulating steps, chest tightness but denies symptoms currently. Has multiple bowel movements >3, BRPR from hemmrhoids, black stool, left lower quadrant abdominal pain, 3-4/10 that has been constant throughout the day. Per triage patient instructed to go to the ED for further evaluation. ED course below.       Hospital Course by Problem (required):  #UGI Bleed  Patient has history of chronic etoh use with cirrhosis, now with diarrhea with tarry stools, +guiac positive. He recently had a EGD on 10/2018 which was negative for esophageal or gastric varices. Patient wasafebrile and hemodynamically stablethroughout admission with hgb within normal limits. Fecal calprotectin negative. Hematology was consulted, did not recommend EGD, will follow up OP. Continue with oral PPI BID x8 weeks.      #Etoh Withdrawal   # Cirrhosis   # Transaminitis  Patient presented with withdrawal symptoms after failing outpatient therapy with librium taper due to having a seizure. Pt was admitted and initiated on CIWA, required ativan PRN, on day of discharge CIWA scores were below 6 and did not require ativan. No seizure like activity this admission. Pt was over 5 days from last drink upon discharge. Continue with Thiamine, Folic Acid, multivitamindaily.     #Insomnia   #PTSD  #Anxiety  Takes xanax 1mg  TID at home, discontinued at discharge. Started  on Klonopin 1mg  TID this admission, had marked improvement in withdrawal, anxiety, and PTSD symptoms. Pt was able to sleep without episode of insomnia this admission. Provided a 2 day script for klonopin as well as 1 week script for prazosin. Referral to OP Psychiatry at [place].      # Prolonged QTc  Noted QTc was 521 on monitor the morning of 5/7. EKG was then with a QTc of 475, would recommend using QTc prolonging agents with caution in the future.     #Thrombocytopenia : stable and chronic   Patient with history of thrombocytopenia 100-129 over the past year, likely related to cirrhosis. Stable this admission.      #HTN   BP mildly elevated on admission in the setting of ETOH withdrawal, previously on [place] 12.5mg . Remained normotensive this admission.     Tests Outstanding at Discharge Requiring Follow Up:  None    Discharge Condition (required):  Stable.    Key Physical Exam Findings at Discharge:  Mental Status Exam: Patient is alert and oriented to person, place, time, and situation.  Constitutional: He isoriented to person, place, and time.   HENT:  Facial flushing   Head:Normocephalic. Red perioral rash with some clear discharge, no crusting   Cardiovascular:Normal rate,regular rhythmand intact distal pulses.   Pulmonary/Chest:Effort normaland breath sounds normal. Norespiratory distress.   Abdominal:Soft.Bowel sounds are normal. No abd tenderness  Musculoskeletal:Normal range of motion.   Neurological: He isalertand oriented to person, place, and time.   No asterixis  Skin: Skin iswarmand dry. He isnot diaphoretic.   Psychiatric:Affectnormal.     Discharge Diet:  Low-salt.    Discharge Medications:     What To Do With Your Medications      START taking these medications      Add'l Info   clonazePAM 1 MG tablet  Commonly known as:  KLONOPIN  Take 1 tablet (1 mg) by mouth every 8 hours.   Quantity:  6 tablet  Refills:  0     folic acid 1 MG tablet  Commonly known as:  FOLVITE  Take 1  tablet (1 mg) by mouth daily.  Start taking on:  Apr 08, 2019   Quantity:  30 tablet  Refills:  0     omeprazole 20 MG capsule  Commonly known as:  PRILOSEC  Take 1 capsule (20 mg) by mouth 2 times daily (before meals).   Quantity:  60 capsule  Refills:  1     prazosin 1 MG capsule  Commonly known as:  MINIPRESS  Take 1 capsule (1 mg) by mouth nightly.   Quantity:  7 capsule  Refills:  0     thiamine 100 MG Tabs  Commonly known as:  VITAMIN B1  Take 1 tablet (100 mg) by mouth daily.  Start taking on:  Apr 08, 2019   Quantity:  30 tablet  Refills:  0        CONTINUE taking these medications      Add'l Info   albuterol 108 (90 Base) MCG/ACT inhaler  Inhale 2 puffs by mouth every 6 hours as needed for Wheezing.   Quantity:  1 Inhaler  Refills:  2     fluticasone propionate 50 MCG/ACT nasal spray  Commonly known as:  FLONASE  Spray 2 sprays into each nostril daily.   Quantity:  1 bottle  Refills:  11        STOP taking these medications    ALPRAZolam 1 MG tablet  Commonly known as:  XANAX     chlordiazePOXIDE 25 MG capsule  Commonly known as:  LIBRIUM     hydrochlorothiazide 12.5 MG tablet  Commonly known as:  HYDRODIURIL     NEXIUM PO     zolpidem 10 MG tablet  Commonly known as:  AMBIEN           Where to Get Your Medications      These medications were sent to [facility]  [address], [place] [address]    Hours:  Mon-Fri: 8:30am-7:00pm; Sat-Sun & Holidays: 9:00am-5:00pm Phone:  [phone]    clonazePAM 1 MG tablet   folic acid 1 MG tablet   omeprazole 20 MG capsule   prazosin 1 MG capsule   thiamine 100 MG Tabs         Allergies:  Allergies   Allergen Reactions   . Compazine Anxiety     Tolerates phenergan and zofran   . Latex Rash   . Gabapentin Other     "crawl out of my skin"   . Norco [Hydrocodone-Acetaminophen] Itching   . Vicodin [Apap-Fd&C Yellow #[place]-Hydrocodone] Anxiety     Tolerates morphine and dilaudid       Discharge Disposition:   Home.    Discharge Code Status:  Full code / full care  This code status is not changed from the time of admission.    Follow Up Appointments:    Scheduled appointments:  Future Appointments   Date Time Provider [place]   04/13/2019 10:20 AM [name], MD [facility]   04/20/2019  9:30 AM Irene Pap, Elsie Ra, NP [facility] TXP [facility]P [facility]       For appointments requested for after discharge that have not yet been scheduled, refer to the Post Discharge Referrals section of the After Visit Summary.    Discharging 103 Contact Information:  [facility] operator at [phone].

## 2019-04-07 NOTE — Interdisciplinary (Signed)
04/07/19 1103   Initial Assessment   CM Initial Assessment * Completed   Patient Information   Where was the patient admitted from? * Home   Prior to Level of Function * Ambulatory/Independent with ADL's   Assistive Device * Not applicable   Prior Community Resources None   Primary Caretaker(s) * Self   Primary Contact Name, Number and Relationship Brett Palmer - Spouse - [phone].   Permission to [place] Nurse, children's Resources Other (Comment)  ([facility] HBGF[facility].)   Discharge Planning   Living Arrangements * Spouse /Significant Other   Available Assistance/Support System * Spouse / significant other   Type of Residence * [place] * No   Patient's Discharge Goal(s) Home   Barriers to Discharge * Awaiting consult input   Patient/Family/Other Engaged in Discharge Planning * Yes   Patient Has Decision Making Capacity * Yes   Patient/Family/Legal/Surrogate Decision Maker Has Been Given a List Options And Choice In The Selection of Post-Acute Care Providers * Not Applicable   Family/Caregiver's Assessed for * Not Applicable   Respite Care * Not Applicable   Patient/Family/Other Are In Agreement With Discharge Plan * Yes  (DCP Home, Wife will provide transportation.)   [place] * No   Social Worker Consult   Do you need to see a Education officer, museum? * Yes   Readmission Risk Assessment   Readmission Within 30 Days of Discharge * No   Recent Hospitalizations (Within Last 6 Months) * No   High Risk For Readmission * Yes   High Risk Indicators Substance abuse;Chronic illness   Action Taken To Prevent Readmission After Discharge Social work evaluation and recommendations   Recommendations to the Physician Case management consult;Social worker evaluation   Pt with medical history including Etoh abuse, Etoh cirrrhosis, HTN, SVT s/p ablation, appendectomy (03/2019) that presented for etoh withdrawal and diarrhea with black  stools.   PCP: [name], MD.  Pharmacy: [facility], [place] [address].  Phone: [phone] Fax: [phone].

## 2019-04-08 ENCOUNTER — Other Ambulatory Visit: Payer: Self-pay

## 2019-04-08 ENCOUNTER — Telehealth (INDEPENDENT_AMBULATORY_CARE_PROVIDER_SITE_OTHER): Payer: Self-pay | Admitting: Student in an Organized Health Care Education/Training Program

## 2019-04-08 ENCOUNTER — Telehealth (HOSPITAL_BASED_OUTPATIENT_CLINIC_OR_DEPARTMENT_OTHER): Payer: Self-pay | Admitting: Nurse Practitioner

## 2019-04-08 ENCOUNTER — Telehealth (HOSPITAL_BASED_OUTPATIENT_CLINIC_OR_DEPARTMENT_OTHER): Payer: Self-pay

## 2019-04-08 ENCOUNTER — Telehealth (INDEPENDENT_AMBULATORY_CARE_PROVIDER_SITE_OTHER): Payer: Self-pay | Admitting: Clinical

## 2019-04-08 ENCOUNTER — Other Ambulatory Visit (INDEPENDENT_AMBULATORY_CARE_PROVIDER_SITE_OTHER): Payer: Self-pay | Admitting: Family Medicine

## 2019-04-08 ENCOUNTER — Encounter (INDEPENDENT_AMBULATORY_CARE_PROVIDER_SITE_OTHER): Payer: Self-pay | Admitting: Hospital

## 2019-04-08 DIAGNOSIS — F419 Anxiety disorder, unspecified: Secondary | ICD-10-CM

## 2019-04-08 NOTE — Telephone Encounter (Signed)
Covering for Dr. Salvadore Dom while she is on wards. Declined refill of patient's Alprazolam as this was just filled on 03/24/19 with Qty: 120 and 2 RF's per [place], FNP-BC

## 2019-04-08 NOTE — Telephone Encounter (Signed)
New patient screening form for ART program has been uploaded to Media. Please review and advise on scheduling.            Brett Palmer  [facility]

## 2019-04-08 NOTE — Telephone Encounter (Signed)
Outreach call to f/u on Follow Up;Recent IP Discharge(Alcohol withdrawal syndrome with complication)    No answer received.  Left message, including direct contact information, requesting call back.    PLAN:  Will continue to follow up if no call back today.

## 2019-04-08 NOTE — Telephone Encounter (Signed)
FAMILY MEDICINE ON CALL TELEPHONE TRIAGE:    DATE: 04/08/2019  TIME: 6:20 PM    Name of PCP Provider: [name]    PATIENT NAME: Brett Palmer CALL/CC:     Patient reports that he was discharged from hospital yesterday for alcohol withdrawal and was only given 6 tablets of klonopin on discharge and recommended to stop taking xanax. States he called the NP today and was told she was going to contact his PCP, Dr. Salvadore Dom. He got a message that according to CURES report, he has two refills of xanax to the pharmacy. He states that he is going out of state tomorrow. States that when he takes klonopin 1 mg q8hr, it makes him lightheaded, nauseous, blurry vision, feels fuzzy in his head, and difficulty concentrating. States that he has been on xanax 2 mg BID and minipress since 2001 prescribed by his psychiatrist (last seen on Feb 2020) for refractory PTSD (has tried multiple other anxiolytic. His psychiatrist is aware of his drinking and reports ongoing since 2009. Last alcoholic drink was about a week ago; 1-1 and half bottle of wine. However given his recent hospitalization due to alcohol withdrawal, patient got really scared and states he is dong with drinking. He has not drank since his discharge.     Plan:  Per CURES report, patient had 12 tabs of Librium 25 mg prescribed at [facility] on 04/04/2019 and has been on xanax 1 mg #120 last filled on 03/24/2019 by [facility] provider (first started on xanax in 03/2018). He is also on ambien 10 mg qhs. Given that xanax is a longstanding med for PTSD (prescribed by psychiatrist who was aware of his longstanding drinking) and the fact that patient reports no desire to drink, he was recommended to stop klonopin and resume xanax 2 mg BID. However given that klonopin is a long acting benzo and he already took doses today, he was advised to hold nighttime dose and only take ambien for sleep. He will resume xanax tomorrow morning. Discussed again risk of respiratory  depression and death when taking alcohol and benzos. Patient expressed understanding and was thankful for the call. He has an appointment with psychiatry in June.     Encouraged patient to call back PRN or if symptoms worsen or persist.   Advised pt of ER/[place] precaution. Patient verbalized complete understanding and was appreciative of call. PCP and primary care clinic updated.    Patient encounter forwarded to and to be discussed with triage attending provider Dr. Vernell Leep, MD  Signature Derived From [place], Apr 08, 2019, 6:20 PM  [place] Resident, PGY 4    Cc:[name] [name] and triage attending

## 2019-04-08 NOTE — Telephone Encounter (Signed)
Hi Pricila can you see if he is interested in our DDIOP he has MHN which DDIOP takes? If so either way can you set him up with A[facility] Shackleton for an intake, it would be helpful to know if he was interested in IOP as well. Best, Beckie Busing

## 2019-04-08 NOTE — Telephone Encounter (Signed)
General Inquiry     Who is calling: Ancillary: Anderson Malta from [facility] is calling on behalf of patient    Reason for this call: Anderson Malta called stating that patient called concerning his medication.      clonazePAM (KLONOPIN) 1 MG tablet - Pt is asking for additional pills due to he was concern that he doesn't have enough over the weekend and he was told to stop his Xanax which he was taking for more than 20 years.        Action required by office: Pt asking to send more Klonopin at the pharmacy on file      Duplicate encounter? No previous documentation found on this issue.     Best way to contact: [phone]    Alternative:    Inquiry has been read verbatim to this caller. Verbalizes satisfaction and confirms the above is accurate: yes    Has been advised this message will be transmitted to office and can expect a response within the next 24-72 hours.    Encounter created by Care Assist MA.  If further action required please route encounter to appropriate in clinic MA/LVN/Resident Pool

## 2019-04-08 NOTE — Telephone Encounter (Signed)
Established with: [name]   Future Appointments   Date Time Provider [place]   04/13/2019 10:20 AM [name], MD [facility]   04/20/2019  9:30 AM Mikey College, NP MOS TXP HEP MOS     Requested Medication(s):  Requested Prescriptions     Pending Prescriptions Disp Refills   . clonazePAM (KLONOPIN) 1 MG tablet 6 tablet 0     Sig: Take 1 tablet (1 mg) by mouth every 8 hours.     Last Fill Date: 04/07/2019  Quantity Last Filled:6  Send to:     [id number], [place]  [place] [address]  Phone: [phone] Fax: [phone]     Allergies   Allergen Reactions   . Compazine Anxiety     Tolerates phenergan and zofran   . Latex Rash   . Gabapentin Other     "crawl out of my skin"   . Norco [Hydrocodone-Acetaminophen] Itching   . Vicodin [Apap-Fd&C Yellow #10 [place]-Hydrocodone] Anxiety     Tolerates morphine and dilaudid      Last labs:   Lab Results   Component Value Date    CHOL 221 07/21/2018    HDL 48 07/21/2018    LDLCALC 154 07/21/2018    TRIG 95 07/21/2018    TSH 4.80 (H) 07/21/2018    A1C 5.8 07/21/2018      Blood Pressure   04/07/19 131/72   04/04/19 135/85   11/17/18 (!) 153/94      Health Maintenance Due   Topic Date Due   . Shingles Vaccine (1 of 2) 01/24/2016      Current Medication(s):   Current Outpatient Medications   Medication Sig Dispense Refill   . albuterol 108 (90 BASE) MCG/ACT inhaler Inhale 2 puffs by mouth every 6 hours as needed for Wheezing. 1 Inhaler 2   . clonazePAM (KLONOPIN) 1 MG tablet Take 1 tablet (1 mg) by mouth every 8 hours. 6 tablet 0   . fluticasone propionate (FLONASE) 50 MCG/ACT nasal spray Spray 2 sprays into each nostril daily. 1 bottle 11   . folic acid (FOLVITE) 1 MG tablet Take 1 tablet (1 mg) by mouth daily. 30 tablet 0   . omeprazole (PRILOSEC) 20 MG capsule Take 1 capsule (20 mg) by mouth 2 times daily (before meals). 60 capsule 1   . prazosin (MINIPRESS) 1 MG capsule Take 1 capsule (1  mg) by mouth nightly. 7 capsule 0   . thiamine (VITAMIN B1) 100 MG TABS Take 1 tablet (100 mg) by mouth daily. 30 tablet 0     No current facility-administered medications for this visit.

## 2019-04-08 NOTE — Telephone Encounter (Signed)
-----   Message from Brayton Mars, NP sent at 04/07/2019 11:38 AM PDT -----  Regarding: d/c patient scheduled to see Shirlean Mylar 05/20  Inpatient Discharge    Follow up type: Established  Patient's primary attending: Amalia Greenhouse MD  Date of admission and discharge: 05/06-05/7  Reason for admission:  alcohol withdrawal and "melena"  Discharge MELD: 9  Inpatient procedures: None  Please indicate any specific follow up needs or relevant patient information:     24 year oldmale with hx of heavy alcohol abuse, obesity, PTSD, SVT s/p ablation,compensated EtOHcirrhosis (based on MRE of 11 kPa and non-invasive serologic assessment,FIB4/APRI), present with ETOH withdrawal and diarrhea with black stools. Hepatology consulted for c/f GIB, hb stable at baseline ~13, last EGD 10/2018 with no EV, colonoscopy 05/2018 with four small polyps removed, as DRE negative for bleed, hb stable and HDS defer endoscopic intervention at this time      Patient should be seen by NP in the following time frame: scheduled to see Robin 05/20 per patient request

## 2019-04-08 NOTE — Telephone Encounter (Signed)
Former patient of Dr. Vida Roller. Schedule know with Robin 5/20- unclear why changing care teams.     Please call patient and review post d/c needs/symptoms and offer visit with myself in next 1-2 weeks ([facility] on Tuesday or [facility] on Thursday AM) OR if patient prefers to keep apt with Shirlean Mylar

## 2019-04-08 NOTE — Telephone Encounter (Signed)
Transitional Telephonic Nurse (TTN) Post Discharge Manual Follow-Up Telephone Encounter  Inpatient Encounter CSN: [id number] Admission Date: 2019-04-06 Discharge Date: 2019-04-07  Autocall Status: Discharge Instructions, Medication Concern Call Date: 2019-04-08 Contact Number: [phone]  Primary Diagnosis: ALCOHOL WITHDRAWAL SYNDROME (CMS-HCC)    Clinician: Jovita Kussmaul BSN, RN  Phone: [phone]  Email: [email or web address]    2019-04-08 15:33:50 - Patient Reached - All Issues Resolved; Encounter Closed  Contacted: Patient    Autocall Alert False Positive(s): Discharge Instructions    Medication Issues/Concerns: Administration Concerns    Interventions: Clinic/Provider/PCP Contacted; Clinic/Provider/PCP Will Follow UpOther Interventions: Educated Patient    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 States only received 6 Klonopin pills and is worried about "going into benzo withdrawals." Is taking Klonopin q8 hrs as directed; will be taking his pill shortly and then will only have 2 left. States "I don't like the way it makes me feel; I feel sick after I first take it and only feel better closer to the 8 hrs when it is due again. It makes me restless and I cannot sleep." States has not been shaky. Tolerating PO and no n/v or black stools. Has videovisit with PCP Dr Salvadore Dom next week 5/13. States spoke with [facility] today and is expecting call back re: appt. Also reports having a psychiatrist at the [facility] [place] but plans to see [facility] first. Pt aware of recommended diet. TTN reviewed 911/Return Precautions.    Informed pt that TTN will route message to PCP's office w/request to contact pt re: his meds. Recommended pt call office if he does not hear from them in an hour. Pt acknowledged understanding. Pt had no further questions/concerns for TTN.    TTN spoke with  Sheila/Dr Celebi's office [phone]; she routed message to staff re: pt's concern w/stopping Xanax and not enough Klonopin but also doesn't like the way Klonopin makes him feel. Provided Freda Munro w/pt's preferred pharmacy as per Inpt CM Assessment: [facility], [address] [place]; pt reached at [phone].    TTN will notify [place] of TTN call as pt with Population Health w/Risk Score of 19.

## 2019-04-09 ENCOUNTER — Telehealth (INDEPENDENT_AMBULATORY_CARE_PROVIDER_SITE_OTHER): Payer: Self-pay | Admitting: Student in an Organized Health Care Education/Training Program

## 2019-04-09 LAB — ALPHA FETOPROTEIN TOTAL & L3 PERCENT
AFP L3%: 8.5 % (ref 0.0–9.9)
AFP Total: 6 ng/mL (ref 0–15)

## 2019-04-09 NOTE — Telephone Encounter (Signed)
Telephone contact and advice reviewed with Dr. Reinaldo Raddle.  I concur with the documentation and outcome of this Telephone call.    (cc: [name])    Gabriel Earing MD

## 2019-04-09 NOTE — Telephone Encounter (Signed)
FAMILY MEDICINE ON CALL TELEPHONE TRIAGE:    DATE: 04/09/2019  TIME: 9:17 AM    Name of PCP Provider: [name]    PATIENT NAME: Brett Palmer CALL/CC:     Called patient back this morning to check in but went to voicemail so left a message that patient should reach out with any questions or concerns. Also, advised that patient should take appropriate precautions when traveling given the pandemic.     Encouraged patient to call back PRN or if symptoms worsen or persist.   Advised pt of ER/[place] precaution. Patient verbalized complete understanding and was appreciative of call. PCP and primary care clinic updated.    Patient encounter forwarded to and to be discussed with triage attending provider Dr. Vernell Leep, MD  Signature Derived From [place], Apr 09, 2019, 9:17 AM  [facility] Resident, PGY 4    Cc:[name] [name] and triage attending

## 2019-04-10 DIAGNOSIS — R9431 Abnormal electrocardiogram [ECG] [EKG]: Secondary | ICD-10-CM

## 2019-04-10 DIAGNOSIS — I2119 ST elevation (STEMI) myocardial infarction involving other coronary artery of inferior wall: Secondary | ICD-10-CM

## 2019-04-10 LAB — ECG 12-LEAD
ATRIAL RATE: 74 {beats}/min
ECG INTERPRETATION: NORMAL
PR INTERVAL: 176 ms
QRS INTERVAL/DURATION: 98 ms
QT: 430 ms
QTC INTERVAL: 477 ms
R AXIS: 221 degrees
T AXIS: -71 degrees
VENTRICULAR RATE: 74 {beats}/min

## 2019-04-11 NOTE — Telephone Encounter (Signed)
Referral received on 04/11/19 from TCM Call unable to reach,  referred to: [employer] on 04/11/19 for [employer] case managment  follow up.

## 2019-04-11 NOTE — Telephone Encounter (Signed)
Outreach call to f/u on Recent IP Discharge(Alcohol withdrawal syndrome with complication)    No answer received.  Left message, including direct contact information, requesting call back

## 2019-04-12 ENCOUNTER — Telehealth (INDEPENDENT_AMBULATORY_CARE_PROVIDER_SITE_OTHER): Payer: Self-pay | Admitting: Nurse Practitioner

## 2019-04-12 NOTE — Telephone Encounter (Signed)
Post discharge call placed to patient by this RN.   Pt states he is doing well, no complaints.  Pt states he is boarding a plane at time of call and states he will come for his follow up on 04/20/19.  Pt prefers to see Dr. Shiela Mayer. Irene Pap, NP for future care.  Declines needs at this time.

## 2019-04-12 NOTE — Telephone Encounter (Signed)
[  Late entry]    Telephone contact and advice reviewed with Dr. Reinaldo Raddle.    I concur with the documentation and outcome of this Telephone call.    (cc: [name])    Gabriel Earing MD

## 2019-04-12 NOTE — Telephone Encounter (Signed)
I have attempted to contact this patient by phone unable to reach, left detailed voicemail withreason for call along with callback information.

## 2019-04-13 ENCOUNTER — Encounter (HOSPITAL_BASED_OUTPATIENT_CLINIC_OR_DEPARTMENT_OTHER): Payer: Self-pay

## 2019-04-13 ENCOUNTER — Encounter (INDEPENDENT_AMBULATORY_CARE_PROVIDER_SITE_OTHER): Payer: Self-pay

## 2019-04-13 ENCOUNTER — Telehealth (INDEPENDENT_AMBULATORY_CARE_PROVIDER_SITE_OTHER): Payer: Commercial Managed Care - HMO | Admitting: Family Medicine

## 2019-04-13 NOTE — Progress Notes (Deleted)
Brett Palmer MyChart Video Visit      ---------------------(data below generated by Rosalva Ferron, )--------------------    Patient Verification & Palmer Consent:    I am proceeding with this evaluation at the direct request of the patient.  I have verified this is the correct patient and have obtained verbal consent and written consent from the patient/ surrogate to perform this voluntary Palmer evaluation (including obtaining history, performing examination and reviewing data provided by the patient).   The patient/ surrogate has the right to refuse this evaluation.  I have explained risks (including potential loss of confidentiality), benefits, alternatives, and the potential need for subsequent face to face care. Patient/ surrogate understands that there is a risk of medical inaccuracies given that our recommendations will be made based on reported data (and we must therefore assume this information is accurate).  Knowing that there is a risk that this information is not reported accurately, and that the Palmer video, audio, or data feed may be incomplete, the patient agrees to proceed with evaluation and holds [place] harmless knowing these risks. In this evaluation, we will be providing recommendations only. The patient/ surrogate has been notified that other healthcare professionals (including students, residents and Metallurgist) may be involved in this audio-video evaluation.   All laws concerning confidentiality and patient access to medical records and copies of medical records apply to Palmer.  The patient/ surrogate has received the [place] Notice of Privacy Practices.  I have reviewed this above verification and consent paragraph with the patient/ surrogate.  If the patient is not capacitated to understand the above, and no surrogate is available, since this is not an emergency evaluation, the visit will be rescheduled until such time that the patient can consent,  or the surrogate is available to consent.    Demographics:   Medical Record #: [id number]   Date: Apr 13, 2019   Patient Name: Brett Palmer   DOB: [birth year]  Age: 53 year old  Sex: male  Location: {Patient Location:22496::"Home address on file"}    Evaluator(s):   Brett Palmer was evaluated by me today.    Clinic Location: [facility]    [facility]  [address]  [place] [address]    *** PLEASE DELETE THIS:  Please enter LOS [phone], [phone], [phone] based on extent of hx, exam, MDM performed/documented, or time when visit is dominated by counseling/coordination of care and add the GT modifier to LOS.    If you convert the video visit to a telephone visit due to technical issues, you should use the following codes instead: 99441 5-10 minutes, 99442, 11-20 minutes, and 99443, 21-30 minutes. You should also add a remark about this in your note.    Palmer Video Visit Palmer Appointment:    Brett Palmer is a 53 year old male w/ Hx ETOH use d/o, ETOH cirrhosis, PTSD, HTN, and SVT s/p ablation who presents for PCP f/u after being admitted for UGI bleed and ETOH withdrawal.    Patient consent was acquired.    ***    Review of Systems:  Review of Systems - {Review of Systems:10510}    Past Medical History:  Patient Active Problem List   Diagnosis   . Hypertensive disorder   . Anxiety   . Palpitations   . ETOH abuse   . PTSD (post-traumatic stress disorder)   . Transaminitis   . Inadequate exercise - not at goal   . Alcoholic cirrhosis  of liver without ascites (CMS-HCC)   . Alcohol withdrawal syndrome with complication (CMS-HCC)   . Thrombocytopenia (CMS-HCC)   . Gastrointestinal hemorrhage, unspecified gastrointestinal hemorrhage type       Medications were reviewed and updated on the Active Medication List     Psychosocial:  Social History     Tobacco Use   . Smoking status: Former Smoker     Packs/day: 0.50     Years: 10.00     Pack years: 5.00      Types: Cigarettes     Last attempt to quit: 01/11/1990     Years since quitting: 29.2   . Smokeless tobacco: Never Used   Substance Use Topics   . Alcohol use: Yes     Comment: down to 1/2-3/4 bottle of wine   . Drug use: No     Comment: Denies MJ use        Allergies   Allergen Reactions   . Compazine Anxiety     Tolerates phenergan and zofran   . Latex Rash   . Gabapentin Other     "crawl out of my skin"   . Norco [Hydrocodone-Acetaminophen] Itching   . Vicodin [Apap-Fd&C Yellow #[place]-Hydrocodone] Anxiety     Tolerates morphine and dilaudid       The Family History:  Family History   Problem Relation Name Age of Onset   . Cancer Father          melanoma, died at age 57   . Heart Disease Mother          CHF, thought to have passed from MI   . Heart Disease Maternal Aunt          CHF       Physical Exam:  GENERAL: {GENERAL APPEARANCE:50}  MENTAL STATUS: {Mental Status Exam - Affect:17450::"Full","Appropriate"}  HEENT: {EXAM; HYQMV:78469}  CHEST PHYSICAL FINDINGS: {CHEST LOCATION:14067}  ABDOMEN INSPECTION: {ABDOMEN INSPECTION:602}  EXTREMITY LOCATION: {Extrem:20655}  BEHAVIORAL: {NEURO BEHAVIOR:310160}  NEUROLOGICAL: {Neuro Exam Brief:25845::"Gait normal. Moves all four extremities spontaneously. Speech normal."}    Studies  Na 140 (05/07) CL 104 (05/07) BUN 6 (05/07) GLU   79 (05/07)   K 3.8 (05/07) CO2 24 (05/07) Cr 0.71 (05/07)        WBC 4.2 (05/07) HGB 14.0 (05/07) PLT 87* (05/07)    HCT 43.1 (05/07)        PT 14.2* (05/07) PTT 35* (05/06)   INR 1.2 (05/07)         Imaging: ***    ASSESSMENT AND PLAN  Brett Palmer is a 53 year old male who was provided virtual visit with video visit care delivery.   There are no diagnoses linked to this encounter.    The plan was carefully reviewed verbally with the patient and also affirmed that the patient understood next steps and follow up plan.    Orders this encounter:  Lab No orders of the defined types were placed in this encounter.    Imaging No orders of  the defined types were placed in this encounter.    Procedures No orders of the defined types were placed in this encounter.    Other No orders of the defined types were placed in this encounter.      RETURN TO CLINIC INSTRUCTIONS  Return to clinic in ***{DAY/WEEK:11653} for ***

## 2019-04-18 ENCOUNTER — Other Ambulatory Visit (HOSPITAL_BASED_OUTPATIENT_CLINIC_OR_DEPARTMENT_OTHER): Payer: Self-pay | Admitting: Pharmacist

## 2019-04-18 ENCOUNTER — Telehealth (INDEPENDENT_AMBULATORY_CARE_PROVIDER_SITE_OTHER): Payer: Self-pay | Admitting: Nurse Practitioner

## 2019-04-18 ENCOUNTER — Telehealth (HOSPITAL_BASED_OUTPATIENT_CLINIC_OR_DEPARTMENT_OTHER): Payer: Self-pay | Admitting: Pharmacist

## 2019-04-18 MED ORDER — CHLORDIAZEPOXIDE HCL 25 MG OR CAPS
25.00 mg | ORAL_CAPSULE | Freq: Every day | ORAL | Status: DC
Start: ? — End: 2019-09-22

## 2019-04-18 MED ORDER — ZOLPIDEM TARTRATE 10 MG OR TABS
10.00 mg | ORAL_TABLET | Freq: Every evening | ORAL | Status: DC | PRN
Start: ? — End: 2019-09-22

## 2019-04-18 MED ORDER — PRAZOSIN HCL 1 MG OR CAPS
4.00 mg | ORAL_CAPSULE | Freq: Every evening | ORAL | Status: DC
Start: ? — End: 2019-09-22

## 2019-04-18 MED ORDER — ALPRAZOLAM 2 MG OR TABS
4.00 mg | ORAL_TABLET | Freq: Every evening | ORAL | Status: DC | PRN
Start: ? — End: 2020-02-20

## 2019-04-18 NOTE — Telephone Encounter (Signed)
3rd Attempt to reach patient to convert in clinic visit to [facility], Unable to reach left detailed voice message with reason for call.  If patient calls back, please convert appointment.

## 2019-04-18 NOTE — Telephone Encounter (Signed)
Called for post-hospital discharge med rec

## 2019-04-18 NOTE — Progress Notes (Signed)
---------------------(data below generated by Carolann Littler, PHARMD)--------------------    Patient Verification & Telephone Based Visit Consent:      I have personally performed a full and complete verbal consent of this patient via telephone prior to proceeding with this telephone evaluation. The verbal consent discussion included each of the elements as described in the verification below.    I am proceeding with this evaluation at the direct request of the patient.  I have verified this is the correct patient and have obtained verbal consent from the patient/ surrogate to perform this voluntary telephone visit evaluation (including obtaining history and reviewing data provided by the patient).   The patient/ surrogate has the right to refuse this evaluation.  I have explained risks (including potential loss of confidentiality), benefits, alternatives, and the potential need for subsequent face to face care. Patient/ surrogate understands that there is a risk of medical inaccuracies given that our recommendations will be made based on reported data (and we must therefore assume this information is accurate).  Knowing that there is a risk that this information is not reported accurately, and that the audio, or data feed may be incomplete, the patient agrees to proceed with evaluation and holds [place] harmless knowing these risks. In this evaluation, we will be providing recommendations only.  The patient/ surrogate has been notified that other healthcare professionals (including students, residents and Metallurgist) may be involved in this audio evaluation.   All laws concerning confidentiality and patient access to medical records and copies of medical records apply. I have reviewed this above verification and consent paragraph with the patient/ surrogate.  If the patient is not capacitated to understand the above, and no surrogate is available, since this is not an emergency evaluation, the visit will be  rescheduled until such time that the patient can consent, or the surrogate is available to consent.    Demographics:   Medical Record #: [id number]   Date: Apr 18, 2019   Patient Name: Brett Palmer   DOB: [birth year]  Age: 53 year old  Sex: male  Location: Home address on file    Evaluator(s):   [name] was evaluated by me today.    Clinic Location: [facility]  [place] [facility]  [address] [place] [address]    The duration of this telephone visit was 15 minutes.       Hepatology Discharge Clinic Pharmacy Note:    Indication for recent admission: EtOH withdrawal and suspected UGIB    Medication reconciliation completed: yes    New discharge medications:   START taking these medications      Add'l Info   clonazePAM 1 MG tablet  Commonly known as:  KLONOPIN  Take 1 tablet (1 mg) by mouth every 8 hours.   Quantity:  6 tablet  Refills:  0     folic acid 1 MG tablet  Commonly known as:  FOLVITE  Take 1 tablet (1 mg) by mouth daily.  Start taking on:  Apr 08, 2019   Quantity:  30 tablet  Refills:  0     omeprazole 20 MG capsule  Commonly known as:  PRILOSEC  Take 1 capsule (20 mg) by mouth 2 times daily (before meals).   Quantity:  60 capsule  Refills:  1     prazosin 1 MG capsule  Commonly known as:  MINIPRESS  Take 1 capsule (1 mg) by mouth nightly.   Quantity:  7  capsule  Refills:  0     thiamine 100 MG Tabs  Commonly known as:  VITAMIN B1  Take 1 tablet (100 mg) by mouth daily.  Start taking on:  Apr 08, 2019   Quantity:  30 tablet  Refills:  0           Was patient able to obtain discharge medications: yes    Side effects related to medications: N/A    Other medication issues:   - Pt reports he already finished his clonazepam.  He spoke with an on-call physician, unclear if from [place] or [place], but provided him with Librium 25mg  QDay for which he is currently taking.  He is also receiving Xanax 4mg  QHS from [place] as well  - Pt reports he has been  taking omeprazole 20mg  QDay for many years and he doesn't have any issues with GERD, didn't know why he was increased to BID but he didn't do it.  Informed him it was for suspected GIB, pt was amendable to increase PPI to BID only for 8 weeks    Medication changes today:   - Counseled pt to increase PPI to BID x 8 weeks  - Advised pt to call [place] to convert his visit to [place] he is currently in [place]    Patient education: reviewed medication changes and provided updated medactionplan.

## 2019-04-19 NOTE — Progress Notes (Deleted)
[facility] Telemedicine MyChart Video Visit      ---------------------(data below generated by Mikey College, NP)--------------------    Patient Verification & Telemedicine Consent:    I am proceeding with this evaluation at the direct request of the patient.  I have verified this is the correct patient and have obtained verbal consent and written consent from the patient/ surrogate to perform this voluntary telemedicine evaluation (including obtaining history, performing examination and reviewing data provided by the patient).   The patient/ surrogate has the right to refuse this evaluation.  I have explained risks (including potential loss of confidentiality), benefits, alternatives, and the potential need for subsequent face to face care. Patient/ surrogate understands that there is a risk of medical inaccuracies given that our recommendations will be made based on reported data (and we must therefore assume this information is accurate).  Knowing that there is a risk that this information is not reported accurately, and that the telemedicine video, audio, or data feed may be incomplete, the patient agrees to proceed with evaluation and holds [place] harmless knowing these risks. In this evaluation, we will be providing recommendations only. The patient/ surrogate has been notified that other healthcare professionals (including students, residents and Metallurgist) may be involved in this audio-video evaluation.   All laws concerning confidentiality and patient access to medical records and copies of medical records apply to telemedicine.  The patient/ surrogate has received the [place] Notice of Privacy Practices.  I have reviewed this above verification and consent paragraph with the patient/ surrogate.  If the patient is not capacitated to understand the above, and no surrogate is available, since this is not an emergency evaluation, the visit will be rescheduled until such time that the patient can consent, or the  surrogate is available to consent.    Demographics:   Medical Record #: [id number]   Date: Apr 19, 2019   Patient Name: Brett Palmer   DOB: [birth year]  Age: 53 year old  Sex: male  Location: {Patient Location:22496::"Home address on file"}    Evaluator(s):   Tag Wurtz was evaluated by me today.    Clinic Location: [facility]  [place] [facility]  [address]  [place] [address]    *** PLEASE DELETE THIS:  Please enter LOS [phone], [phone], [phone] based on extent of hx, exam, MDM performed/documented, or time when visit is dominated by counseling/coordination of care and add the GT modifier to LOS.    If you convert the video visit to a telephone visit due to technical issues, you should use the following codes instead: 99441 5-10 minutes, 99442, 11-20 minutes, and 99443, 21-30 minutes. You should also add a remark about this in your note.    Telemedicine Video Visit Telemedicine Appointment:    Brett Palmer is a 53 year old male who is attending a virtual based visit. Pt with hx of heavy alcohol abuse, obesity, PTSD, SVT s/p ablation, compensated EtOH cirrhosis (based on MRE of 11 kPa and non-invasive serologic assessment, FIB4/APRI), present with ETOH withdrawal and diarrhea with black stools. Hepatology consulted for c/f GIB. Patient states he has been having diarrhea with melena for the past few weeks. Denies nausea/vomiting, hematemesis, hematochezia, juandice, or confusion. Last drink  Was 05/03. Denies recent NSAID use. Of note, recently had appendectomy in [place] 3 weeks ago.    Recently seen in ED 05/04 with right lower abdominal pain, n/v, fevers, and diarrhea. Had CTAP with postsurgical changes  of appendectomy without evidence of an acute process. Symptoms improved and patient was discharged with librium taper. Last drink was 05/03.         Patient consent was acquired.    ***    Review of Systems:  Review of Systems - {Review of  Systems:10510}    Past Medical History:  Patient Active Problem List   Diagnosis   . Hypertensive disorder   . Anxiety   . Palpitations   . ETOH abuse   . PTSD (post-traumatic stress disorder)   . Transaminitis   . Inadequate exercise - not at goal   . Alcoholic cirrhosis of liver without ascites (CMS-HCC)   . Alcohol withdrawal syndrome with complication (CMS-HCC)   . Thrombocytopenia (CMS-HCC)   . Gastrointestinal hemorrhage, unspecified gastrointestinal hemorrhage type       Medications were reviewed and updated on the Active Medication List     Psychosocial:  Social History     Tobacco Use   . Smoking status: Former Smoker     Packs/day: 0.50     Years: 10.00     Pack years: 5.00     Types: Cigarettes     Last attempt to quit: 01/11/1990     Years since quitting: 29.2   . Smokeless tobacco: Never Used   Substance Use Topics   . Alcohol use: Yes     Comment: down to 1/2-3/4 bottle of wine   . Drug use: No     Comment: Denies MJ use        Allergies   Allergen Reactions   . Compazine Anxiety     Tolerates phenergan and zofran   . Latex Rash   . Gabapentin Other     "crawl out of my skin"   . Norco [Hydrocodone-Acetaminophen] Itching   . Vicodin [Apap-Fd&C Yellow #10 [place]-Hydrocodone] Anxiety     Tolerates morphine and dilaudid       The Family History:  Family History   Problem Relation Name Age of Onset   . Cancer Father          melanoma, died at age 39   . Heart Disease Mother          CHF, thought to have passed from MI   . Heart Disease Maternal Aunt          CHF       Physical Exam:  GENERAL: {GENERAL APPEARANCE:50}  MENTAL STATUS: {Mental Status Exam - Affect:17450::"Full","Appropriate"}  HEENT: {EXAM; UXLKG:40102}  CHEST PHYSICAL FINDINGS: {CHEST LOCATION:14067}  ABDOMEN INSPECTION: {ABDOMEN INSPECTION:602}  EXTREMITY LOCATION: {Extrem:20655}  BEHAVIORAL: {NEURO BEHAVIOR:310160}  NEUROLOGICAL: {Neuro Exam Brief:25845::"Gait normal. Moves all four extremities spontaneously. Speech normal."}    Studies  Na  140 (05/07) CL 104 (05/07) BUN 6 (05/07) GLU   79 (05/07)   K 3.8 (05/07) CO2 24 (05/07) Cr 0.71 (05/07)        WBC 4.2 (05/07) HGB 14.0 (05/07) PLT 87* (05/07)    HCT 43.1 (05/07)        PT 14.2* (05/07) PTT 35* (05/06)   INR 1.2 (05/07)         Imaging:   Result Date: 04/04/2019  IMPRESSION: CT scan of the abdomen and pelvis with IV contrast. 1. postsurgical changes of appendectomy; no evidence of intra-abdominal/pelvic abscesses or diverticulitis. 2. Increased right ileocolic mesentery stranding, most likely postoperative changes. 3. Hepatomegaly with findings suggestive of mild steatosis. No imaging features of advanced cirrhosis.    EGD 11/17/2018  FINDINGS/Diagnosis  1) No esophageal or gastric varices  2) Normal GE junction at 40cm  3) Mild PHG  4) Normal duodenum    ASSESSMENT AND PLAN  Layden Caterino is a 53 year old male who was provided virtual visit with video visit care delivery PT history of chronic etoh use with cirrhosis, now with diarrhea with tarry stools, +guiac positive. He recently had a EGD on 10/2018 which was negative for esophageal or gastric varices. Patient wasafebrile and hemodynamically stablethroughout admission with hgb within normal limits. Fecal calprotectin negative. Hematology was consulted, did not recommend EGD, will follow up OP. Continue with oral PPI BID x8 weeks.      #Etoh Withdrawal   # Cirrhosis  # Transaminitis  Patient presented with withdrawal symptoms after failing outpatient therapy with librium taper due to having a seizure. Pt was admitted and initiated on CIWA, required ativan PRN, on day of discharge CIWA scores were below 6 and did not require ativan. No seizure like activity this admission. Pt was over 5 days from last drink upon discharge. Continue with Haz[place] Acid,multivitamindaily.    #Insomnia   #PTSD  #Anxiety  Takes xanax 1mg  TID at home, discontinued at discharge. Started on Klonopin 1mg  TID this admission, had marked improvement in  withdrawal, anxiety, and PTSD symptoms. Pt was able to sleep without episode of insomnia this admission. Provided a 2 day script for klonopin as well as 1 week script for prazosin. Referral to OP Psychiatry at [facility].     # Prolonged QTc  Noted QTc was 521 on monitor the morning of 5/7. EKG was then with a QTc of 475, would recommend using QTc prolonging agents with caution in the future.     #Thrombocytopenia: stable and chronic  Patient with history of thrombocytopenia 100-129over the past year, likely related to cirrhosis. Stable this admission.      #HTN   BP mildly elevated on admission in the setting of ETOH withdrawal, previously on [place] 12.5mg . Remained normotensive this admission.   .   There are no diagnoses linked to this encounter.    The plan was carefully reviewed verbally with the patient and also affirmed that the patient understood next steps and follow up plan.    Orders this encounter:  Lab No orders of the defined types were placed in this encounter.    Imaging No orders of the defined types were placed in this encounter.    Procedures No orders of the defined types were placed in this encounter.    Other No orders of the defined types were placed in this encounter.      RETURN TO CLINIC INSTRUCTIONS  Return to clinic in ***{DAY/WEEK:11653} for ***

## 2019-04-20 ENCOUNTER — Ambulatory Visit (HOSPITAL_BASED_OUTPATIENT_CLINIC_OR_DEPARTMENT_OTHER): Payer: Commercial Managed Care - HMO | Admitting: Nurse Practitioner

## 2019-04-21 ENCOUNTER — Encounter (HOSPITAL_BASED_OUTPATIENT_CLINIC_OR_DEPARTMENT_OTHER): Payer: Self-pay | Admitting: Nurse Practitioner

## 2019-05-03 NOTE — Progress Notes (Signed)
[facility] Telemedicine MyChart Video Visit      ---------------------(data below generated by Mikey College, NP)--------------------    Patient Verification & Telemedicine Consent:    I am proceeding with this evaluation at the direct request of the patient.  I have verified this is the correct patient and have obtained verbal consent and written consent from the patient/ surrogate to perform this voluntary telemedicine evaluation (including obtaining history, performing examination and reviewing data provided by the patient).   The patient/ surrogate has the right to refuse this evaluation.  I have explained risks (including potential loss of confidentiality), benefits, alternatives, and the potential need for subsequent face to face care. Patient/ surrogate understands that there is a risk of medical inaccuracies given that our recommendations will be made based on reported data (and we must therefore assume this information is accurate).  Knowing that there is a risk that this information is not reported accurately, and that the telemedicine video, audio, or data feed may be incomplete, the patient agrees to proceed with evaluation and holds [place] harmless knowing these risks. In this evaluation, we will be providing recommendations only. The patient/ surrogate has been notified that other healthcare professionals (including students, residents and Metallurgist) may be involved in this audio-video evaluation.   All laws concerning confidentiality and patient access to medical records and copies of medical records apply to telemedicine.  The patient/ surrogate has received the [place] Notice of Privacy Practices.  I have reviewed this above verification and consent paragraph with the patient/ surrogate.  If the patient is not capacitated to understand the above, and no surrogate is available, since this is not an emergency evaluation, the visit will be rescheduled until such time that the patient can consent, or the  surrogate is available to consent.    Demographics:   Medical Record #: [id number]   Date: May 05, 2019   Patient Name: Brett Palmer   DOB: 1966/07/07  Age: 53 year old  Sex: male  Location: Home address on file    Evaluator(s):   Tobby Fawcett was evaluated by me today.  NP Yaira Bernardi  Clinic Location: [facility]  [place] [facility]  [address] [place] [address]      Telemedicine Video Visit Telemedicine Appointment: Patient consent was acquired.    Paige Vanderwoude is a 53 year old male who is attending a virtual based visit. Pt with hx of heavy alcohol abuse, obesity, PTSD, SVT s/p ablation, compensated EtOH cirrhosis (based on MRE of 11 kPa and non-invasive serologic assessment, FIB4/APRI) was admitted 5/6-06/2019 for ETOH withdrawal and diarrhea with black stools. Hepatology consulted for c/f GIB guiac positive. He recently had a EGD on 10/2018 which was negative for esophageal or gastric varices he was hemodymamically stable throught admission with HGB wnl fecal calprotectin neg Hematology did not recommend repeat EGD pt was advised to continue PPI BID x 8 weeks. Pt was having withdrawal symptoms after failing out pt librium taper. Pt was CIWA protocol until d/c no seizure activity is to continue thiamin, folic acid and multivitamin . Pt is attending [place] visit today due to COVID-19 global pandemic he is staying safe at home. He was last seen in hepatology on 09/15/2018 by NP Silverio Lay, He is new to this NP today He endorses still having cravings, that are worse in the afternoon. He is under care with an outpatient psychiatrist that is providing him Xanax and ambien to help with  his cravings and anxiety. Pt denies fever SOB cough or DOE Pt denies ascites, edema, hematemesis, hematochezia, denies melena, jaundice, pruritis or acholic stools, denies hepatic encephalopathy (no overt encephalopathy, no reversal of sleep/wake cycles, no memory/attention issues).  Last  drink was 05/03.         Review of Systems:  Review of Systems - Constitutional: negative for: fever.    Past Medical History:  Patient Active Problem List   Diagnosis   . Hypertensive disorder   . Anxiety   . Palpitations   . ETOH abuse   . PTSD (post-traumatic stress disorder)   . Transaminitis   . Inadequate exercise - not at goal   . Alcoholic cirrhosis of liver without ascites (CMS-HCC)   . Alcohol withdrawal syndrome with complication (CMS-HCC)   . Thrombocytopenia (CMS-HCC)   . Gastrointestinal hemorrhage, unspecified gastrointestinal hemorrhage type       Medications were reviewed and updated on the Active Medication List     Psychosocial:  Social History     Tobacco Use   . Smoking status: Former Smoker     Packs/day: 0.50     Years: 10.00     Pack years: 5.00     Types: Cigarettes     Last attempt to quit: 01/11/1990     Years since quitting: 29.3   . Smokeless tobacco: Never Used   Substance Use Topics   . Alcohol use: Yes     Comment: down to 1/2-3/4 bottle of wine   . Drug use: No     Comment: Denies MJ use        Allergies   Allergen Reactions   . Compazine Anxiety     Tolerates phenergan and zofran   . Latex Rash   . Gabapentin Other     "crawl out of my skin"   . Norco [Hydrocodone-Acetaminophen] Itching   . Vicodin [Apap-Fd&C Yellow #10 [place]-Hydrocodone] Anxiety     Tolerates morphine and dilaudid       The Family History:  Family History   Problem Relation Name Age of Onset   . Cancer Father          melanoma, died at age 74   . Heart Disease Mother          CHF, thought to have passed from MI   . Heart Disease Maternal Aunt          CHF       Physical Exam:  GENERAL: healthy, alert, no distress, smiling  MENTAL STATUS: Full and Appropriate  HEENT: Sclera clear, anicteric  CHEST PHYSICAL FINDINGS: grossly normal  ABDOMEN INSPECTION: obese  EXTREMITY LOCATION: No gross abnormalities  BEHAVIORAL: Reports  off and on Anxiety. With difficulty controlling cravings  NEUROLOGICAL: Gait normal. Moves  all four extremities spontaneously. Speech normal.    Studies  Na 140 (05/07) CL 104 (05/07) BUN 6 (05/07) GLU   79 (05/07)   K 3.8 (05/07) CO2 24 (05/07) Cr 0.71 (05/07)        WBC 4.2 (05/07) HGB 14.0 (05/07) PLT 87* (05/07)    HCT 43.1 (05/07)        PT 14.2* (05/07) PTT 35* (05/06)   INR 1.2 (05/07)         Imaging:   04/04/2019 CT ABD  IMPRESSION: CT scan of the abdomen and pelvis with IV contrast.   1. postsurgical changes of appendectomy; no evidence of intra-abdominal/pelvic abscesses or diverticulitis.   2. Increased right ileocolic mesentery  stranding, most likely postoperative changes.   3. Hepatomegaly with findings suggestive of mild steatosis. No imaging features of advanced cirrhosis.    11/17/2018 EGD   FINDINGS/Diagnosis  1) No esophageal or gastric varices  2) Normal GE junction at 40cm  3) Mild PHG  4) Normal duodenum    ASSESSMENT AND PLAN  Brett Palmer is a 53 year old male who was provided virtual visit with video visit care delivery due to COVID-19 pandemic and public health emergency. Pt with history of chronic etoh use with cirrhosis, currenltly under care of outpatient psychiatrist to control withdrawal, and aid with sobriety anxiety and cravings    Etoh Withdrawal   Patient presented to ED with withdrawal symptoms after failing outpatient therapy with librium taper due to having a seizure.   - Pt was admitted and initiated on CIWA, required ativan PRN, on day of discharge   - CIWA scores were below 6 and did not require ativan.   - No seizure like activity this admission.   Pt was over 5 days from last drink upon discharge.   Continue with [place] Acid,multivitamindaily.  - continue with individual therapy and psychiatry  EtOH Cirrhosis  MELD 10, CTP A  - cirrhosis based on biochemical assessment and MRE   - No h/o jaundice, confusion, ascites, LLE or s/sx's UGIB   - counseled regarding natural history of cirrhosis and risk for [facility] and liver decompensation  - Transplant  candidate: Not a candidate d/t low biologic MELD and continued alcohol use   HCC Surveillance  - reviewed surveillance: imaging UTD, negative, AFP   epeat every 6 months as recommended by AASLD guidelines  - due 10/2019   EV  10/2018 EGD - no EV repeat 3 years due 10/2021   Ascites none not on diuretics  HE none no asterixis not on lactulose  Alcohol abuse  - Encouraged cessation of alcohol, he is resistant to this as he is self medication PTSD with alcohol  - Discussed progression of cirrhosis/risk of decompensation with ongoing use  Anxiety/PTSD  Takes xanax 1mg  TID at home  Follow up with psychiatry    Prolonged QTc  Noted QTc was 521 on monitor the morning of 5/7.   EKG was then with a QTc of 475,   would recommend using QTc prolonging agents with caution in the future.     Thrombocytopenia: stable and chronic  Patient with history of thrombocytopenia 100-129over the past year,   likely related to cirrhosis. Stable this admission.      HTN   BP mildly elevated on admission in the setting of ETOH withdrawal,   previously on [place] 12.5mg . Remained normotensive this admission.   - follow up with PCP  HCM  -advised patient against taking NSAIDS/aspirin, take acetaminophen for pain, no more then 500mg  every 6 hours or 2000mg  in 24 hours.  -recommend 1.5gm/kg protein daily, may supplement with protein shakes, no more then 2gm sodium daily, avoid alcohol  -immunizations: up to date/recommend routine immunizations for age from PCP    Follow up in 3 months    Ulys was seen today for alcoholic hepatitis and cirrhosis.    Diagnoses and all orders for this visit:    ETOH abuse    Alcohol withdrawal syndrome with complication (CMS-HCC)    Alcoholic cirrhosis of liver without ascites (CMS-HCC)    Thrombocytopenia (CMS-HCC)    PTSD (post-traumatic stress disorder)    Anxiety    Essential hypertension  The plan was carefully reviewed verbally with the patient and also affirmed that the patient understood next steps  and follow up plan.    Orders this encounter:  Lab No orders of the defined types were placed in this encounter.    Imaging No orders of the defined types were placed in this encounter.    Procedures No orders of the defined types were placed in this encounter.    Other No orders of the defined types were placed in this encounter.      RETURN TO CLINIC INSTRUCTIONS  Return to clinic in 51month(s) for cirrhosis follow up

## 2019-05-04 ENCOUNTER — Telehealth (INDEPENDENT_AMBULATORY_CARE_PROVIDER_SITE_OTHER): Payer: Self-pay | Admitting: Nurse Practitioner

## 2019-05-04 ENCOUNTER — Other Ambulatory Visit: Payer: Self-pay

## 2019-05-04 ENCOUNTER — Telehealth (INDEPENDENT_AMBULATORY_CARE_PROVIDER_SITE_OTHER): Payer: Commercial Managed Care - HMO | Admitting: Nurse Practitioner

## 2019-05-04 DIAGNOSIS — I10 Essential (primary) hypertension: Secondary | ICD-10-CM

## 2019-05-04 DIAGNOSIS — F4310 Post-traumatic stress disorder, unspecified: Secondary | ICD-10-CM

## 2019-05-04 DIAGNOSIS — F10239 Alcohol dependence with withdrawal, unspecified: Secondary | ICD-10-CM

## 2019-05-04 DIAGNOSIS — F10939 Alcohol use, unspecified with withdrawal, unspecified (CMS-HCC): Secondary | ICD-10-CM

## 2019-05-04 DIAGNOSIS — D696 Thrombocytopenia, unspecified: Secondary | ICD-10-CM

## 2019-05-04 DIAGNOSIS — F1010 Alcohol abuse, uncomplicated: Secondary | ICD-10-CM

## 2019-05-04 DIAGNOSIS — K7030 Alcoholic cirrhosis of liver without ascites: Secondary | ICD-10-CM

## 2019-05-04 DIAGNOSIS — F419 Anxiety disorder, unspecified: Secondary | ICD-10-CM

## 2019-05-04 NOTE — Telephone Encounter (Signed)
Patient attended My Chart Video Visit today. Visit note and instructions reviewed prior to sending message. A My Chart message with AVS instructions was sent to patient. Advised patient to contact our office if they have concerns or questions. Any orders, referrals or refills were confirmed prior to sending message to patient.

## 2019-05-04 NOTE — Patient Instructions (Addendum)
1. Continue your sobriety  2. Continue with your psychiatry team  3. We will follow up in person in 3 months    Thank you for allowing me to participate in your care today.    As a reminder, please do not go to the lab without confirming you have lab orders in the computer. You may call the lab at [phone] to confirm.     Please do not hesitate to call your Care Team at [phone] if you have any questions or concerns.    Franne Grip, MD, PhD  Hepatologist    Wetzel Bjornstad, MSN, FNP-BC  Hepatology Nurse Practitioner    Rhys Martini, RN refill medications or medical questions  [place]: [phone]    Appointments, procedures or insurance issues  [place] call center:  [employer] [phone]

## 2019-05-05 ENCOUNTER — Encounter (INDEPENDENT_AMBULATORY_CARE_PROVIDER_SITE_OTHER): Payer: Self-pay | Admitting: Nurse Practitioner

## 2019-08-09 NOTE — Progress Notes (Deleted)
[facility] Telemedicine MyChart Video Visit      ---------------------(data below generated by Mikey College, NP)--------------------    Patient Verification & Telemedicine Consent:    I am proceeding with this evaluation at the direct request of the patient.  I have verified this is the correct patient and have obtained verbal consent and written consent from the patient/ surrogate to perform this voluntary telemedicine evaluation (including obtaining history, performing examination and reviewing data provided by the patient).   The patient/ surrogate has the right to refuse this evaluation.  I have explained risks (including potential loss of confidentiality), benefits, alternatives, and the potential need for subsequent face to face care. Patient/ surrogate understands that there is a risk of medical inaccuracies given that our recommendations will be made based on reported data (and we must therefore assume this information is accurate).  Knowing that there is a risk that this information is not reported accurately, and that the telemedicine video, audio, or data feed may be incomplete, the patient agrees to proceed with evaluation and holds [place] harmless knowing these risks. In this evaluation, we will be providing recommendations only. The patient/ surrogate has been notified that other healthcare professionals (including students, residents and Metallurgist) may be involved in this audio-video evaluation.   All laws concerning confidentiality and patient access to medical records and copies of medical records apply to telemedicine.  The patient/ surrogate has received the [place] Notice of Privacy Practices.  I have reviewed this above verification and consent paragraph with the patient/ surrogate.  If the patient is not capacitated to understand the above, and no surrogate is available, since this is not an emergency evaluation, the visit will be rescheduled until such time that the patient can consent, or the  surrogate is available to consent.    Demographics:   Medical Record #: [id number]   Date: August 09, 2019   Patient Name: Brett Palmer   DOB: 01/30/1966  Age: 53 year old  Sex: male  Location: Home address on file    Evaluator(s):   Camen Ketch was evaluated by me today.  NP Stellarose Cerny  Clinic Location: [facility]   [facility]  [address] [place] [address]      Telemedicine Video Visit Telemedicine Appointment: Patient consent was acquired.    Brett Palmer is a 53 year old male who is attending a virtual based visit. Pt with hx of heavy alcohol abuse, obesity, PTSD, SVT s/p ablation, compensated EtOH cirrhosis (based on MRE of 11 kPa and non-invasive serologic assessment, FIB4/APRI) was admitted 5/6-06/2019 for ETOH withdrawal and diarrhea with black stools. Hepatology consulted for c/f GIB guiac positive. He recently had a EGD on 10/2018 which was negative for esophageal or gastric varices he was hemodymamically stable throught admission with HGB wnl fecal calprotectin neg Hematology did not recommend repeat EGD pt was advised to continue PPI BID x 8 weeks. Pt was having withdrawal symptoms after failing out pt librium taper. Pt was CIWA protocol until d/c no seizure activity is to continue thiamin, folic acid and multivitamin . Pt is attending [place] visit today due to COVID-19 global pandemic he is staying safe at home. He was last seen in hepatology on 09/15/2018 by NP Silverio Lay, He is new to this NP today He endorses still having cravings, that are worse in the afternoon. He is under care with an outpatient psychiatrist that is providing him Xanax and ambien to help with  his cravings and anxiety. Pt denies fever SOB cough or DOE Pt denies ascites, edema, hematemesis, hematochezia, denies melena, jaundice, pruritis or acholic stools, denies hepatic encephalopathy (no overt encephalopathy, no reversal of sleep/wake cycles, no memory/attention issues).   Last drink was 05/03.         Review of Systems:  Review of Systems - Constitutional: negative for: fever.    Past Medical History:  Patient Active Problem List   Diagnosis   . Hypertensive disorder   . Anxiety   . Palpitations   . ETOH abuse   . PTSD (post-traumatic stress disorder)   . Transaminitis   . Inadequate exercise - not at goal   . Alcoholic cirrhosis of liver without ascites (CMS-HCC)   . Alcohol withdrawal syndrome with complication (CMS-HCC)   . Thrombocytopenia (CMS-HCC)   . Gastrointestinal hemorrhage, unspecified gastrointestinal hemorrhage type       Medications were reviewed and updated on the Active Medication List     Psychosocial:  Social History     Tobacco Use   . Smoking status: Former Smoker     Packs/day: 0.50     Years: 10.00     Pack years: 5.00     Types: Cigarettes     Quit date: 01/11/1990     Years since quitting: 29.5   . Smokeless tobacco: Never Used   Substance Use Topics   . Alcohol use: Yes     Comment: down to 1/2-3/4 bottle of wine   . Drug use: No     Comment: Denies MJ use        Allergies   Allergen Reactions   . Compazine Anxiety     Tolerates phenergan and zofran   . Latex Rash   . Gabapentin Other     "crawl out of my skin"   . Norco [Hydrocodone-Acetaminophen] Itching   . Vicodin [Apap-Fd&C Yellow #10 [place]-Hydrocodone] Anxiety     Tolerates morphine and dilaudid       The Family History:  Family History   Problem Relation Name Age of Onset   . Cancer Father          melanoma, died at age 39   . Heart Disease Mother          CHF, thought to have passed from MI   . Heart Disease Maternal Aunt          CHF       Physical Exam:  GENERAL: healthy, alert, no distress, smiling  MENTAL STATUS: Full and Appropriate  HEENT: Sclera clear, anicteric  CHEST PHYSICAL FINDINGS: grossly normal  ABDOMEN INSPECTION: obese  EXTREMITY LOCATION: No gross abnormalities  BEHAVIORAL: Reports  off and on Anxiety. With difficulty controlling cravings  NEUROLOGICAL: Gait normal. Moves all four  extremities spontaneously. Speech normal.    Studies  Na 140 (05/07) CL 104 (05/07) BUN 6 (05/07) GLU   79 (05/07)   K 3.8 (05/07) CO2 24 (05/07) Cr 0.71 (05/07)        WBC 4.2 (05/07) HGB 14.0 (05/07) PLT 87* (05/07)    HCT 43.1 (05/07)        PT 14.2* (05/07) PTT 35* (05/06)   INR 1.2 (05/07)         Imaging:   04/04/2019 CT ABD  IMPRESSION: CT scan of the abdomen and pelvis with IV contrast.   1. postsurgical changes of appendectomy; no evidence of intra-abdominal/pelvic abscesses or diverticulitis.   2. Increased right ileocolic mesentery stranding, most  likely postoperative changes.   3. Hepatomegaly with findings suggestive of mild steatosis. No imaging features of advanced cirrhosis.    11/17/2018 EGD   FINDINGS/Diagnosis  1) No esophageal or gastric varices  2) Normal GE junction at 40cm  3) Mild PHG  4) Normal duodenum    ASSESSMENT AND PLAN  Brett Palmer is a 53 year old male who was provided virtual visit with video visit care delivery due to COVID-19 pandemic and public health emergency. Pt with history of chronic etoh use with cirrhosis, currenltly under care of outpatient psychiatrist to control withdrawal, and aid with sobriety anxiety and cravings    Etoh Withdrawal   Patient presented to ED with withdrawal symptoms after failing outpatient therapy with librium taper due to having a seizure.   - Pt was admitted and initiated on CIWA, required ativan PRN, on day of discharge   - CIWA scores were below 6 and did not require ativan.   - No seizure like activity this admission.   Pt was over 5 days from last drink upon discharge.   Continue with [place] Acid,multivitamindaily.  - continue with individual therapy and psychiatry  EtOH Cirrhosis  MELD 10, CTP A  - cirrhosis based on biochemical assessment and MRE   - No h/o jaundice, confusion, ascites, LLE or s/sx's UGIB   - counseled regarding natural history of cirrhosis and risk for [facility] and liver decompensation  - Transplant candidate:  Not a candidate d/t low biologic MELD and continued alcohol use   HCC Surveillance  - reviewed surveillance: imaging UTD, negative, AFP   epeat every 6 months as recommended by AASLD guidelines  - due 10/2019   EV  10/2018 EGD - no EV repeat 3 years due 10/2021   Ascites none not on diuretics  HE none no asterixis not on lactulose  Alcohol abuse  - Encouraged cessation of alcohol, he is resistant to this as he is self medication PTSD with alcohol  - Discussed progression of cirrhosis/risk of decompensation with ongoing use  Anxiety/PTSD  Takes xanax 1mg  TID at home  Follow up with psychiatry    Prolonged QTc  Noted QTc was 521 on monitor the morning of 5/7.   EKG was then with a QTc of 475,   would recommend using QTc prolonging agents with caution in the future.     Thrombocytopenia: stable and chronic  Patient with history of thrombocytopenia 100-129over the past year,   likely related to cirrhosis. Stable this admission.      HTN   BP mildly elevated on admission in the setting of ETOH withdrawal,   previously on [place] 12.5mg . Remained normotensive this admission.   - follow up with PCP  HCM  -advised patient against taking NSAIDS/aspirin, take acetaminophen for pain, no more then 500mg  every 6 hours or 2000mg  in 24 hours.  -recommend 1.5gm/kg protein daily, may supplement with protein shakes, no more then 2gm sodium daily, avoid alcohol  -immunizations: up to date/recommend routine immunizations for age from PCP    Follow up in 3 months    There are no diagnoses linked to this encounter.    The plan was carefully reviewed verbally with the patient and also affirmed that the patient understood next steps and follow up plan.    Orders this encounter:  Lab No orders of the defined types were placed in this encounter.    Imaging No orders of the defined types were placed in this encounter.    Procedures  No orders of the defined types were placed in this encounter.    Other No orders of the defined types were  placed in this encounter.      RETURN TO CLINIC INSTRUCTIONS  Return to clinic in 38month(s) for cirrhosis follow up

## 2019-08-10 ENCOUNTER — Encounter (INDEPENDENT_AMBULATORY_CARE_PROVIDER_SITE_OTHER): Payer: Commercial Managed Care - HMO | Admitting: Nurse Practitioner

## 2019-08-17 ENCOUNTER — Encounter (INDEPENDENT_AMBULATORY_CARE_PROVIDER_SITE_OTHER): Payer: Self-pay | Admitting: Hospital

## 2019-09-22 ENCOUNTER — Encounter: Payer: Self-pay | Admitting: Hospital

## 2019-09-22 ENCOUNTER — Other Ambulatory Visit: Payer: Commercial Managed Care - HMO | Attending: Family Medicine

## 2019-09-22 ENCOUNTER — Other Ambulatory Visit (INDEPENDENT_AMBULATORY_CARE_PROVIDER_SITE_OTHER): Payer: Commercial Managed Care - HMO

## 2019-09-22 ENCOUNTER — Ambulatory Visit (INDEPENDENT_AMBULATORY_CARE_PROVIDER_SITE_OTHER): Payer: Commercial Managed Care - HMO | Admitting: Family Medicine

## 2019-09-22 ENCOUNTER — Other Ambulatory Visit: Payer: Self-pay

## 2019-09-22 VITALS — BP 101/61 | HR 69 | Temp 97.8°F | Resp 20 | Ht 72.0 in | Wt 240.2 lb

## 2019-09-22 DIAGNOSIS — Z1339 Encounter for screening examination for other mental health and behavioral disorders: Secondary | ICD-10-CM

## 2019-09-22 DIAGNOSIS — Z789 Other specified health status: Secondary | ICD-10-CM | POA: Insufficient documentation

## 2019-09-22 DIAGNOSIS — K7030 Alcoholic cirrhosis of liver without ascites: Secondary | ICD-10-CM | POA: Insufficient documentation

## 2019-09-22 DIAGNOSIS — F411 Generalized anxiety disorder: Secondary | ICD-10-CM

## 2019-09-22 DIAGNOSIS — Z23 Encounter for immunization: Secondary | ICD-10-CM

## 2019-09-22 DIAGNOSIS — Z1389 Encounter for screening for other disorder: Secondary | ICD-10-CM

## 2019-09-22 LAB — CBC WITH DIFF, BLOOD
ANC-Automated: 3 10*3/uL (ref 1.6–7.0)
Abs Basophils: 0 10*3/uL
Abs Eosinophils: 0.2 10*3/uL (ref 0.0–0.5)
Abs Lymphs: 2.6 10*3/uL (ref 0.8–3.1)
Abs Monos: 0.3 10*3/uL (ref 0.2–0.8)
Basophils: 1 %
Eosinophils: 4 %
Hct: 45.3 % (ref 40.0–50.0)
Hgb: 14.7 gm/dL (ref 13.7–17.5)
Lymphocytes: 42 %
MCH: 28.3 pg (ref 26.0–32.0)
MCHC: 32.5 g/dL (ref 32.0–36.0)
MCV: 87.1 um3 (ref 79.0–95.0)
MPV: 10.4 fL (ref 9.4–12.4)
Monocytes: 5 %
Plt Count: 148 10*3/uL (ref 140–370)
RBC: 5.2 10*6/uL (ref 4.60–6.10)
RDW: 13.8 % (ref 12.0–14.0)
Segs: 48 %
WBC: 6.2 10*3/uL (ref 4.0–10.0)

## 2019-09-22 LAB — COMPREHENSIVE METABOLIC PANEL, BLOOD
ALT (SGPT): 15 U/L (ref 0–41)
AST (SGOT): 23 U/L (ref 0–40)
Albumin: 4.7 g/dL (ref 3.5–5.2)
Alkaline Phos: 79 U/L (ref 40–129)
Anion Gap: 15 mmol/L (ref 7–15)
BUN: 7 mg/dL (ref 6–20)
Bicarbonate: 24 mmol/L (ref 22–29)
Bilirubin, Tot: 0.79 mg/dL (ref <1.2)
Calcium: 10.2 mg/dL (ref 8.5–10.6)
Chloride: 100 mmol/L (ref 98–107)
Creatinine: 0.86 mg/dL (ref 0.67–1.17)
GFR: >60 mL/min
Glucose: 99 mg/dL (ref 70–99)
Potassium: 4.5 mmol/L (ref 3.5–5.1)
Sodium: 139 mmol/L (ref 136–145)
Total Protein: 8.2 g/dL — ABNORMAL HIGH (ref 6.0–8.0)

## 2019-09-22 LAB — PROTHROMBIN TIME, BLOOD
INR: 1.1
PT,Patient: 11.8 s (ref 9.7–12.5)

## 2019-09-22 LAB — LIPID(CHOL FRACT) PANEL, BLOOD
Cholesterol: 171 mg/dL (ref <200)
HDL-Cholesterol: 48 mg/dL
LDL-Chol (Calc): 112 mg/dL (ref <160)
Non-HDL Cholesterol: 123 mg/dL
Triglycerides: 55 mg/dL (ref 10–170)

## 2019-09-22 LAB — ALPHA FETOPROTEIN, BLOOD: AFP: 3.6 ng/mL (ref 0.0–8.3)

## 2019-09-22 MED ORDER — LORAZEPAM 1 MG OR TABS
1.00 mg | ORAL_TABLET | Freq: Four times a day (QID) | ORAL | 0 refills | Status: DC | PRN
Start: 2019-09-22 — End: 2020-02-20

## 2019-09-22 NOTE — Interdisciplinary (Signed)
Blood drawn from right arm with 21 gauge needle. 3 tubes taken.   Patient identity authenticated by Brett Palmer.

## 2019-09-22 NOTE — Progress Notes (Signed)
Family Medicine Clinic Progress Note    CC:   Chief Complaint   Patient presents with   . ER F/U         Subjective: Brett Palmer is a 53 year old male who is here for CC above.      He is going to be transferring care due to now being 100% disabled and will be getting all care through the [place]  Wanted to come to say goodbye and express gratitude for his medical care here  Has intentionally lost weight, he thinks our scale is a bit overestimate as his known weight is 235 lb  Has been eating a more plant-based diet, appetite has been a little lower these days, not sure why  Has been sober since his hospital stay in May for alcohol withdrawal, does not have cravings  He has an outside psych, he is down to 3mg  daily Xanax, hoping to wean off soon  Taking Ativan at night for sleep, has helped with sleep terrors but he still wakes up in the middle of the night. Not on prazosin anymore. Had reaction to trazodone in the past. Doesn't think he's ever tried mirtazapine.  Does sometimes have days where he is paralyzed by depression but not all the time      Wt Readings from Last 5 Encounters:   09/22/19 109 kg (240 lb 3.2 oz)   04/05/19 121 kg (266 lb 12.8 oz)   04/04/19 119.1 kg (262 lb 8 oz)   11/17/18 119.7 kg (264 lb)   10/14/18 122 kg (269 lb)         ROS: as per HPI    PMH reviewed/updated.    Patient Active Problem List   Diagnosis   . Hypertensive disorder   . Anxiety   . Palpitations   . ETOH abuse   . PTSD (post-traumatic stress disorder)   . Transaminitis   . Inadequate exercise - not at goal   . Alcoholic cirrhosis of liver without ascites (CMS-HCC)   . Alcohol withdrawal syndrome with complication (CMS-HCC)   . Thrombocytopenia (CMS-HCC)   . Gastrointestinal hemorrhage, unspecified gastrointestinal hemorrhage type   . Adequate exercise, at or above goal       Medications reviewed with patient and medication list reconciled.   Patient's understanding and response to medications assessed.     Outpatient  Medications Prior to Visit   Medication Sig Dispense Refill   . albuterol 108 (90 BASE) MCG/ACT inhaler Inhale 2 puffs by mouth every 6 hours as needed for Wheezing. 1 Inhaler 2   . alprazolam (XANAX) 2 MG tablet Take 4 mg by mouth nightly as needed for Insomnia.     . chlordiazePOXIDE (LIBRIUM) 25 MG capsule Take 25 mg by mouth daily.     . fluticasone propionate (FLONASE) 50 MCG/ACT nasal spray Spray 2 sprays into each nostril daily. 1 bottle 11   . folic acid (FOLVITE) 1 MG tablet Take 1 tablet (1 mg) by mouth daily. 30 tablet 0   . omeprazole (PRILOSEC) 20 MG capsule Take 1 capsule (20 mg) by mouth 2 times daily (before meals). 60 capsule 1   . prazosin (MINIPRESS) 1 MG capsule Take 4 mg by mouth nightly.     . prazosin (MINIPRESS) 1 MG capsule Take 1 capsule (1 mg) by mouth nightly. 7 capsule 0   . zolpidem (AMBIEN) 10 MG tablet Take 10 mg by mouth nightly as needed for Insomnia.       No  facility-administered medications prior to visit.        Immunization History   Administered Date(s) Administered   . Influenza Vaccine (Unspecified) 08/31/2012, 12/02/2017   . Influenza Vaccine >=6 Months 10/04/2012, 11/06/2014, 09/03/2015, 09/06/2016   . Pneumococcal 23 Vaccine (PNEUMOVAX-23) 11/06/2014, 09/15/2016   . Tdap 09/15/2016   . Tetanus/Diptheria Vaccine 01/01/2003, 07/01/2012   . Tuberculin PPD 11/29/2014     Allergies   Allergen Reactions   . Compazine Anxiety     Tolerates phenergan and zofran   . Latex Rash   . Gabapentin Other     "crawl out of my skin"   . Norco [Hydrocodone-Acetaminophen] Itching   . Vicodin [Apap-Fd&C Yellow #10 [place]-Hydrocodone] Anxiety     Tolerates morphine and dilaudid       Objective:  Vitals:    09/22/19 1115   BP: 101/61   BP Location: Left arm   BP Patient Position: Sitting   BP cuff size: Large   Pulse: 69   Resp: 20   Temp: 97.8 F (36.6 C)   TempSrc: Temporal   SpO2: 98%   Weight: 109 kg (240 lb 3.2 oz)   Height: 6' (1.829 m)     Estimated body mass index is 32.58 kg/m as  calculated from the following:    Height as of this encounter: 6' (1.829 m).    Weight as of this encounter: 109 kg (240 lb 3.2 oz).      General Appearance: healthy, alert, no distress, pleasant affect, cooperative.  Heart:  normal rate and regular rhythm, no murmurs, clicks, or gallops.  Lungs: clear to auscultation, respirations unlabored, no chest deformities noted.  Abdomen: Abdomen soft, non-tender. No masses or organomegaly. Bowel sounds normal.  Extremities:  no cyanosis, clubbing, or edema.    GAD 7 04/05/2019 09/22/2019   1. Feeling nervous, anxious or on edge 1 1   2. Not being able to stop or control worrying 1 2   3. Worrying too much about different things 1 1   4. Trouble relaxing 1 1   5. Being so restless that it is hard to sit still 0 1   6. Being easily annoyed or irritable 1 0   7. Feeling afraid as if something awful might happen 0 2   GAD7 Patient Total 5 8   If you checked off any problems, how difficult have these problems made it for you to do your job along with other people? Somewhat difficult Somewhat difficult   Some recent data might be hidden             Assessment/Plan: Lantz Gasper is a 53 year old male with the following issues addressed:    1. Alcoholic cirrhosis of liver without ascites (CMS-HCC)  Has been doing exceedingly well since hospitalization for EtOH withdrawal and has been able to maintain sobriety. No signs of decompensation whatsoever. Will check liver labs below to close the loop before transfer of care, but should definitely establish with a hepatology team for periodic surveillance.   I also noticed after his visit that on 2019 labs his Hep A antibody was negative and his HBSAb titers were waning into the "nonreactive" zone. Would be prudent to pursue Hep A/B series with new PCP.  - CBC w/ Diff Lavender; Future  - Comprehensive Metabolic Panel Green; Future  - Lipid Panel Green Plasma Separator Tube; Future  - Prothrombin Time, Blood Blue; Future  - Alpha  Fetoprotein, Blood - See Instructions; Future  2. Generalized anxiety disorder  Better managed than in the past - this was the main trigger for his EtOH use previously. Continue close f/u with psych and should definitely continue to work on benzo wean. Addition of mirtazapine may help with this taper.    3. Increased physical activity  PAVS Total Activity: (P) 180 minutes/week minutes/week       I have encouraged Mr. Escano to maintain his current physical activity.    4. Need for vaccination  Verified Immunizations/medications prior to administration for Angie, MA.   - shingles [facility]) - Admin Now  - shingles [facility]) - Admin in 2-6 Months; Future  - Flu Vaccine (Fluarix, Fluzone), IM, 6+ mo    5. Screened negative for alcohol use    6. Screened negative for drug use            Patient Instructions   Talk to your psychiatrist about maybe trying a low dose of mirtazapine for sleep/anxiety    Shingles booster dose due in 2-6 months    Next colonoscopy due ~2022    It is now required to schedule an appointment to complete your lab work at our [place] lab location. You may call [phone] to schedule an appointment.        Barriers to Learning assessed: none. Patient verbalizes understanding of teaching and instructions.    Return if symptoms worsen or fail to improve.    [name], MD

## 2019-09-22 NOTE — Patient Instructions (Addendum)
Talk to your psychiatrist about maybe trying a low dose of mirtazapine for sleep/anxiety    Shingles booster dose due in 2-6 months    Next colonoscopy due ~2022    It is now required to schedule an appointment to complete your lab work at our [place] lab location. You may call [phone] to schedule an appointment.

## 2019-10-31 ENCOUNTER — Encounter (INDEPENDENT_AMBULATORY_CARE_PROVIDER_SITE_OTHER): Payer: Self-pay | Admitting: Family Medicine

## 2019-10-31 DIAGNOSIS — Z23 Encounter for immunization: Secondary | ICD-10-CM | POA: Insufficient documentation

## 2019-11-29 ENCOUNTER — Encounter (INDEPENDENT_AMBULATORY_CARE_PROVIDER_SITE_OTHER): Payer: Self-pay | Admitting: Nurse Practitioner

## 2019-11-29 ENCOUNTER — Encounter (INDEPENDENT_AMBULATORY_CARE_PROVIDER_SITE_OTHER): Payer: Self-pay | Admitting: Hospital

## 2019-11-29 ENCOUNTER — Encounter (INDEPENDENT_AMBULATORY_CARE_PROVIDER_SITE_OTHER): Payer: Self-pay | Admitting: Family Medicine

## 2019-11-29 DIAGNOSIS — L989 Disorder of the skin and subcutaneous tissue, unspecified: Secondary | ICD-10-CM

## 2019-11-29 NOTE — Telephone Encounter (Signed)
From: [name]  To: [name], MD  Sent: 11/29/2019 12:44 PM PST  Subject: 1-Non Urgent Medical Advice    Morning Dr. Salvadore Dom,    Could I get a dermatology consult approval from you to go see dermatology for my back? I have attached a picture.     Brett Palmer

## 2019-11-29 NOTE — Telephone Encounter (Signed)
Referral has been written    Randle Shatzer, FNP-BC  (covering provider)

## 2019-11-30 ENCOUNTER — Telehealth (INDEPENDENT_AMBULATORY_CARE_PROVIDER_SITE_OTHER): Payer: Self-pay | Admitting: Dermatology

## 2019-11-30 ENCOUNTER — Encounter (INDEPENDENT_AMBULATORY_CARE_PROVIDER_SITE_OTHER): Payer: Self-pay | Admitting: Family Medicine

## 2019-11-30 NOTE — Telephone Encounter (Signed)
Patient is scheduled for in-clinic appointment.    Appointment scheduled for 12/05/19    1. Do you have or have you had in the last 24 hours: :   Fever:  n   New cough (not chronic) :  n   Shortness of breath:  n  o If yes to any of these, we will not schedule and route an encounter to department specific triage for both NEW and RET patients to assess  1. If patient has an appointment, we will cancel the appointment & route a message to the triage pool.    2. Have you knowingly been exposed to anyone having any, some or all of the symptoms listed above?   n    3. Have you traveled outside of the [place] in the last 14 days? [name]   n    If scheduling appointment: schedule according to specific clinic guidelines.    If no to all questions, document and close encounter.  Do not route.      Did you inform patient of no visitor policy Y

## 2019-11-30 NOTE — Progress Notes (Signed)
Primary MD: Celebi, Caspian Requested By: Osker Mason E    CC: spot check     HISTORY:  Brett Palmer is a 53 year old male who presents for evaluation of skin lesion on the back.  Patient was referred to Dermatology on 11/29/2019.     Today, patient brings attention to a rash on the upper buttock that his wife has noticed.  Onset x 2 weeks ago.  Associated sx include burning. Denies pruritus.  At-home tx include keeping area dry and avoiding irritation.  He also notes ordering tea tree oil but has not received it in the mail yet.  Similar rash occurred in 2013 where his PCP prescribed him Ketoconazole 2% cream and hc cream, which was effective.     Patient was referred to Dermatology where his PCP where a white crusted skin lesion was noticed on the back suspected to be an AK and was referred to Dermatology to r/o concerns.     Additionally, he c/o hyperhidrosis on the feet and hands that he would like to have treated.     Furthermore, patient requests evaluation of a previous treated NMSC s/p MMS site that has recently formed a thicker scar to r/o concerns.     Otherwise no other itching, bleeding, painful, growing, ulcerating, or concerning lesions.    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    History of melanoma: negative.     REVIEW OF SYSTEMS:  CONSTITUTIONAL: negative for fever or chills  DERMATOLOGIC: no pruritus, no pain    Other past Medical History:   The patient  has a past medical history of Alcohol use, Gastroesophageal reflux disease, Hypertension, Migraine headache, PTSD (post-traumatic stress disorder) (1995-present), and SVT (supraventricular tachycardia) (CMS-HCC) (2011).    Family History:   History of melanoma: positive.      The medications were reviewed.    Current Outpatient Medications:   .  albuterol 108 (90 BASE) MCG/ACT inhaler, Inhale 2 puffs by mouth every 6 hours as  needed for Wheezing., Disp: 1 Inhaler, Rfl: 2  .  alprazolam (XANAX) 2 MG tablet, Take 4 mg by mouth nightly as needed for Insomnia., Disp: , Rfl:   .  aluminum chloride (DRYSOL) 20 % external solution, Apply to clean dry surface of areas that sweat a lot at armpits, palms, soles once daily at night, Disp: 60 mL, Rfl: 1  .  fluticasone propionate (FLONASE) 50 MCG/ACT nasal spray, Spray 2 sprays into each nostril daily., Disp: 1 bottle, Rfl: 11  .  ketoconazole (NIZORAL) 2 % cream, Apply to buttock rash bid for 2-4 weeks til it clears, Disp: 30 g, Rfl: 1  .  LORazepam (ATIVAN) 1 MG tablet, Take 1 tablet (1 mg) by mouth every 6 hours as needed for Anxiety., Disp: 0 tablet, Rfl: 0  .  omeprazole (PRILOSEC) 20 MG capsule, Take 1 capsule (20 mg) by mouth 2 times daily (before meals)., Disp: 60 capsule, Rfl: 1    Allergies: Compazine, Latex, Gabapentin, Norco [hydrocodone-acetaminophen], and Vicodin [apap-fd&c yellow #10 [place]-hydrocodone]    PHYSICAL EXAM:  Skin Type:                    2  General Appearance: within normal limits  Neuro: Alert and oriented x 3  Psych: Mood and affect within normal limits  Eyes: Inspection of lids, sclera, and conjunctiva  within normal limits   Extremities: inspection of digits and nails     Skin: The areas examined included face, back, buttocks. -patient kept surgical mask on during entirety of today's visit    All was normal except for the following:  - Upper buttock cleft with a bright erythematous patch extending to the mid buttock cleft   - Faint brown feathery macules   - Brown evenly pigmented and pink/brown macules and papules on the back   - Left upper back with a thin gray pink papule (A)  - Right flank with a pink brown papule overlying a brown macule (B)  - Stuck on hyperpigmented papules   - No e/o recurrence at well healed scar on the back and right preauricular ear     ASSESSMENT AND TREATMENT PLAN:    Intertrigo - candidal vs irritant dermatitis? Less likely inverse  psoriasis  Discussed etiology and shared decision making achieved   Discussed risks and benefits of treatment options   Wash the area with luke warm water letting the soap run past the affected areas.  Patient not to scrub the areas as this will cause further irritation   Gently pat dry  Start Ketoconazole cream applying twice daily for the next 2-4 weeks until area heals   Patient to monitor and rtc if worsening symptoms occur     Hyperhidrosis   drysol 20%: Apply nightly to clean dry areas areas on the hands and feet and axilla; watch for irritation  Avoid the genital/buttock area     Poikiloderma/lentigines and multiple nevi  Reassurance. Sun protection discussed.  Monitor.  Pt to inform us if there are any changes.   Counseled broad spectrum SPF 30 or higher sunscreen use       Neoplasm of uncertain behavior  Site: left upper back (A) R/o DN vs blue nevus   Site: right flank (B) R/o DN   SHAVE BIOPSY  The patient was consented for the procedure.  The risks including bleeding, infection, and scar were discussed.  Time Out performed YES. Correct patient: Yes. Correct procedure: Yes. Correct site: Yes (Pt confirmed location).  Areas as noted above were anesthetized with 1% lidocaine with epinephrine.  Area cleansed with alcohol wipe. A gillette blade was used to obtain the specimen.  Hemostasis was obtained with drysol.  Petroleum jelly/vaseline was applied and a dressing was placed.  The patient tolerated the procedure without complications.  Wound care instructions were given.  The specimen was labelled and send to pathology for evaluation.      Seborrheic keratosis   This is a benign lesion.  Reassurance given. Monitor.      Scar  No e/o recurrence.  Sun protection discussed in detail.    - Diagnoses, natural course, treatments and risks were discussed with the patie nt.  - Return for 3-6 mo skin [place] screening. or sooner for any concerns.        Scribe Attestation  The notes I am recording reflect only actions  made by and judgments taken by this provider, Dr. Michele Mcalpine, for whom I am scribing today. I have performed no independent clinical work.    Ebony Hail    ____________________________________________________________________    Provider Attestation for Scribed Note    As the attending provider, I agree with the scribed content.  Any changes or edits are noted in the text above.    Naomie Dean

## 2019-12-05 ENCOUNTER — Encounter (INDEPENDENT_AMBULATORY_CARE_PROVIDER_SITE_OTHER): Payer: Self-pay | Admitting: Dermatology

## 2019-12-05 ENCOUNTER — Ambulatory Visit (INDEPENDENT_AMBULATORY_CARE_PROVIDER_SITE_OTHER): Payer: No Typology Code available for payment source | Admitting: Dermatology

## 2019-12-05 DIAGNOSIS — L816 Other disorders of diminished melanin formation: Secondary | ICD-10-CM

## 2019-12-05 DIAGNOSIS — R61 Generalized hyperhidrosis: Secondary | ICD-10-CM

## 2019-12-05 DIAGNOSIS — L821 Other seborrheic keratosis: Secondary | ICD-10-CM

## 2019-12-05 DIAGNOSIS — D229 Melanocytic nevi, unspecified: Secondary | ICD-10-CM

## 2019-12-05 DIAGNOSIS — L814 Other melanin hyperpigmentation: Secondary | ICD-10-CM

## 2019-12-05 DIAGNOSIS — D485 Neoplasm of uncertain behavior of skin: Secondary | ICD-10-CM

## 2019-12-05 DIAGNOSIS — L304 Erythema intertrigo: Secondary | ICD-10-CM

## 2019-12-05 DIAGNOSIS — D225 Melanocytic nevi of trunk: Secondary | ICD-10-CM

## 2019-12-05 MED ORDER — KETOCONAZOLE 2 % EX CREA
TOPICAL_CREAM | CUTANEOUS | 1 refills | Status: DC
Start: 2019-12-05 — End: 2020-02-20

## 2019-12-05 MED ORDER — DRYSOL 20 % EX SOLN
CUTANEOUS | 1 refills | Status: AC
Start: 2019-12-05 — End: ?

## 2019-12-05 NOTE — Patient Instructions (Addendum)
[facility]  CARE INSTRUCTIONS  SURGERY/BIOPSY SITES    1.  Keep bandage dry and in place for 24 hours.  2.  After 24 hours, wash daily with a mild non-fragranced soap and water, pat dry. (Okay to shower or bathe)  3.  Once daily apply Aquaphor, petrolatum, or triple antibiotic ointment that you are not allergic to.  4.  Follow ointment with a band aid/bandage to protect clothing and absorb any drainage.  5.   If you have heavy scabbing, clean wound with hydrogen peroxide diluted 1:2 with tap water, for maximum of 3 days.  Apply with a cotton ball or cotton tipped applicator, let soak for 2-3 minutes, then pat dry, and follow with steps 3&4.  6.  For pain or discomfort we recommend Tylenol (Acetaminophen) or Extra-strength Tylenol, use as directed on bottle.  DO NOT use aspirin, Bufferin, Excedrin, Motrin, or any other aspirin containing products, which may cause bleeding.    Normal : NOT Normal:   -Tenderness at site for first couple of days. -Pain at site without aggravation   -Clear yellow or pink drainage    -Cloudy yellow drainage    -Fever/Chills     *If bleeding occurs:  Apply firm direct pressure with a clean cloth or gauze for 15 minutes straight.  After 15 minutes check site:   -If bleeding has slowed apply pressure for 15 more minutes and then it should stop.  If still bleeding heavily call:     Clinic, if during office hours- Prudence Davidson [phone], [facility] paging operator, after hours or on weekends at [phone].  Ask them to page the Dermatology resident on call.    SUNSCREENS:    For sensitive skin (best if the product has only titanium dioxide or zinc oxide in it):   Solbar spf 30 gel  Vanicream sunscreen for sensitive skin spf 60  Neutrogena PURESCREEN products     Sunscreens with zinc oxide or titanium dioxide provide broader spectrum coverage.     Sunscreen needs to be reapplied every 1.5 to 2 hours when you are outside.     "Sun precautions"  online website has clothing w/ protection factor. "Lovette Cliche" is another company with protective clothing. Rit sungaurd on [place] may add protection factor to clothes.     Remember to avoid sun during the peak hours of 10 am - 2 pm.      Wear a wide brimmed hat when outside to protect your scalp and face.     Sunscreens to protect your scalp: replenix spray sunscreen, color science powder sunscreen, supergoop proof part powder (can find these online)    Wear sunglasses that will cover your entire eye and upper cheeks if possible.      For buttocks:     Ketoconazole 2% cream prescribed today.  Use as prescribed.   Wash area with luke warm water allowing the soap to run down the affected areas. Do not scrub with the soap as this can cause further irritation.   Gently pat dry.   Apply Ketoconazole cream to the area twice daily for the next 2-4 weeks until area heals   Monitor the area and if symptoms do not improve or worsen in any way, return for evaluation     For sweating of the hands and feet: (Hyperhidrosis)   Drysol 20% solution prescribed today.  Apply to the affected areas on the hands and feet nightly avoiding the buttock area.  Watch for irritation

## 2019-12-07 ENCOUNTER — Other Ambulatory Visit (INDEPENDENT_AMBULATORY_CARE_PROVIDER_SITE_OTHER): Payer: Self-pay | Admitting: Family Medicine

## 2019-12-07 DIAGNOSIS — J449 Chronic obstructive pulmonary disease, unspecified: Secondary | ICD-10-CM

## 2019-12-07 DIAGNOSIS — F411 Generalized anxiety disorder: Secondary | ICD-10-CM

## 2019-12-07 MED ORDER — ALBUTEROL SULFATE 108 (90 BASE) MCG/ACT IN AERS
2.0000 | INHALATION_SPRAY | Freq: Four times a day (QID) | RESPIRATORY_TRACT | 3 refills | Status: AC | PRN
Start: 2019-12-07 — End: ?

## 2019-12-07 NOTE — Telephone Encounter (Signed)
Medication requested Requested Prescriptions     Pending Prescriptions Disp Refills   . LORazepam (ATIVAN) 1 MG tablet 0 tablet 0     Sig: Take 1 tablet (1 mg) by mouth every 6 hours as needed for Anxiety.     Signed Prescriptions Disp Refills   . albuterol 108 (90 Base) MCG/ACT inhaler 3 Inhaler 3     Sig: Inhale 2 puffs by mouth every 6 hours as needed for Wheezing.     Authorizing Provider: [name]     Ordering User: Ambrose Pancoast         Last Filled Date 09/22/19   Quantity Last Filled     Send to:    [id number], [place]  [place] [address]  Phone: [phone] Fax: [phone]      Last labs:  Lab Results   Component Value Date    CHOL 171 09/22/2019    HDL 48 09/22/2019    LDLCALC 112 09/22/2019    TRIG 55 09/22/2019    TSH 4.80 (H) 07/21/2018    A1C 5.8 07/21/2018      Blood Pressure   09/22/19 101/61   04/07/19 131/72   04/04/19 135/85       Current Medication(s):  Current Outpatient Medications   Medication Sig Dispense Refill   . albuterol 108 (90 Base) MCG/ACT inhaler Inhale 2 puffs by mouth every 6 hours as needed for Wheezing. 3 Inhaler 3   . alprazolam (XANAX) 2 MG tablet Take 4 mg by mouth nightly as needed for Insomnia.     [name] aluminum chloride (DRYSOL) 20 % external solution Apply to clean dry surface of areas that sweat a lot at armpits, palms, soles once daily at night 60 mL 1   . fluticasone propionate (FLONASE) 50 MCG/ACT nasal spray Spray 2 sprays into each nostril daily. 1 bottle 11   . ketoconazole (NIZORAL) 2 % cream Apply to buttock rash bid for 2-4 weeks til it clears 30 g 1   . LORazepam (ATIVAN) 1 MG tablet Take 1 tablet (1 mg) by mouth every 6 hours as needed for Anxiety. 0 tablet 0   . omeprazole (PRILOSEC) 20 MG capsule Take 1 capsule (20 mg) by mouth 2 times daily (before meals). 60 capsule 1     No current facility-administered medications for this visit.        Patient information: Allergies   Allergen Reactions     . Compazine Anxiety     Tolerates phenergan and zofran   . Latex Rash   . Gabapentin Other     "crawl out of my skin"   . Norco [Hydrocodone-Acetaminophen] Itching   . Vicodin [Apap-Fd&C Yellow #10 [place]-Hydrocodone] Anxiety     Tolerates morphine and dilaudid      Health Maintenance Due   Topic Date Due   . PHQ9 Depression Monitoring doc flowsheet  08/06/2019   . Shingles Vaccine (2 of 2) 11/17/2019   . Medicare Annual Wellness Visit  12/05/2019      Established with: Theda Belfast with PCP:  Next OV with PCP:  Last OV with Dept:  Next OV with Dept: 09/22/2019  @[email or web address] @  09/22/2019  Visit date not found

## 2019-12-07 NOTE — Telephone Encounter (Addendum)
Controlled substances are not currently included in Pharmacy Refill Clinic protocol. Re-routing to appropriate staff for processing. Thank you.   ---------------------------------------------------------------------------    Albuterol approved per protocol

## 2019-12-07 NOTE — Telephone Encounter (Signed)
Caryl Bis, CPhT  (Rx Refill and PA Clinic)    Bronchodilator Refill Protocol    Last visit: 09/22/2019  ER f/u    Next f/u appt due:  None stated; PRN  Next scheduled appointment: Visit date not found        LABS required:  (None)    Monitoring required:  (None)

## 2019-12-08 ENCOUNTER — Encounter (INDEPENDENT_AMBULATORY_CARE_PROVIDER_SITE_OTHER): Payer: Self-pay | Admitting: Dermatology

## 2019-12-08 ENCOUNTER — Other Ambulatory Visit (INDEPENDENT_AMBULATORY_CARE_PROVIDER_SITE_OTHER): Payer: Self-pay | Admitting: Family Medicine

## 2019-12-08 NOTE — Telephone Encounter (Signed)
Controlled medications are not currently included in Pharmacy Refill Clinic protocol. Re-routing to appropriate staff for processing. Thank you.

## 2019-12-08 NOTE — Telephone Encounter (Signed)
Established with:  Last OV with PCP:  Next OV with PCP:  Last OV with Dept:  Next OV with Dept: Brett Palmer  09/22/2019  @[email or web address] @  09/22/2019  Visit date not found       Medication requested:   Requested Prescriptions     Pending Prescriptions Disp Refills    LORazepam (ATIVAN) 1 MG tablet 0 tablet 0     Sig: Take 1 tablet (1 mg) by mouth every 6 hours as needed for Anxiety.       Last Filled Date 09/22/19   Quantity Last Filled     Send to:    [id number], [place]  [place] [address]  Phone: [phone] Fax: [phone]      Current Medication(s):  Current Outpatient Medications   Medication Sig Dispense Refill    albuterol 108 (90 Base) MCG/ACT inhaler Inhale 2 puffs by mouth every 6 hours as needed for Wheezing. 3 Inhaler 3    alprazolam (XANAX) 2 MG tablet Take 4 mg by mouth nightly as needed for Insomnia.      aluminum chloride (DRYSOL) 20 % external solution Apply to clean dry surface of areas that sweat a lot at armpits, palms, soles once daily at night 60 mL 1    fluticasone propionate (FLONASE) 50 MCG/ACT nasal spray Spray 2 sprays into each nostril daily. 1 bottle 11    ketoconazole (NIZORAL) 2 % cream Apply to buttock rash bid for 2-4 weeks til it clears 30 g 1    LORazepam (ATIVAN) 1 MG tablet Take 1 tablet (1 mg) by mouth every 6 hours as needed for Anxiety. 0 tablet 0    omeprazole (PRILOSEC) 20 MG capsule Take 1 capsule (20 mg) by mouth 2 times daily (before meals). 60 capsule 1     No current facility-administered medications for this visit.        Patient information: Allergies   Allergen Reactions    Compazine Anxiety     Tolerates phenergan and zofran    Latex Rash    Gabapentin Other     "crawl out of my skin"    Norco [Hydrocodone-Acetaminophen] Itching    Vicodin [Apap-Fd&C Yellow #10 [place]-Hydrocodone] Anxiety     Tolerates morphine and dilaudid      Health Maintenance Due   Topic Date Due     PHQ9 Depression Monitoring doc flowsheet  08/06/2019    Shingles Vaccine (2 of 2) 11/17/2019    Medicare Annual Wellness Visit  12/05/2019      Last labs:  Lab Results   Component Value Date    CHOL 171 09/22/2019    HDL 48 09/22/2019    LDLCALC 112 09/22/2019    TRIG 55 09/22/2019    TSH 4.80 (H) 07/21/2018    A1C 5.8 07/21/2018      Blood Pressure   09/22/19 101/61   04/07/19 131/72   04/04/19 135/85

## 2019-12-09 ENCOUNTER — Telehealth (INDEPENDENT_AMBULATORY_CARE_PROVIDER_SITE_OTHER): Payer: Self-pay | Admitting: Dermatology

## 2019-12-09 DIAGNOSIS — L304 Erythema intertrigo: Secondary | ICD-10-CM

## 2019-12-09 NOTE — Telephone Encounter (Signed)
Ketoconazole 2% is not covered by insurance    covered alt are:     Clotrimazole 1% cream   Econazole 1% Cream   Oxiconazole Cream Nitrate    Please advise

## 2019-12-10 ENCOUNTER — Encounter: Payer: Self-pay | Admitting: Hospital

## 2019-12-12 MED ORDER — ECONAZOLE NITRATE 1 % EX CREA
TOPICAL_CREAM | CUTANEOUS | 1 refills | Status: DC
Start: 2019-12-12 — End: 2020-02-20

## 2019-12-12 NOTE — Telephone Encounter (Signed)
Econazole sent

## 2019-12-15 ENCOUNTER — Encounter (INDEPENDENT_AMBULATORY_CARE_PROVIDER_SITE_OTHER): Payer: Self-pay

## 2020-01-20 ENCOUNTER — Ambulatory Visit (INDEPENDENT_AMBULATORY_CARE_PROVIDER_SITE_OTHER): Payer: Self-pay | Admitting: Family Medicine

## 2020-01-20 ENCOUNTER — Encounter: Payer: Self-pay | Admitting: Hospital

## 2020-01-20 ENCOUNTER — Other Ambulatory Visit: Payer: Self-pay

## 2020-01-20 NOTE — Telephone Encounter (Signed)
From: [name]  To: [name], MD  Sent: 01/20/2020 8:56 AM PST  Subject: 20-Other    Good Morning Dr. Salvadore Dom,    I experienced a period of a-fib last night. I was light-headed and had shortness of breath. I also felt a constant buzz in my hands and ears. I took aspirin and last night I took a diltiazem and felt better after lying down for an hour after which I felt it break.    Id like to schedule an appointment with you or a consult with Dr. Rigoberto Noel (sp) who did my ablation. I could do a video conference at any time.    Thank you.    Regards,    Brett Palmer

## 2020-01-20 NOTE — Telephone Encounter (Signed)
CALL BACK    Pt calling back for Triage Nurse.     Action required by office:  Call warm transferred to Triage.    Best way to contact: [phone]     Encounter created by Care Assist MA.  If further action required please route encounter to appropriate in clinic MA/LVN/Resident Pool

## 2020-01-20 NOTE — Telephone Encounter (Signed)
Secure chat PCP and OK to schedule pt on  Future Appointments   Date Time Provider [place]   01/24/2020 10:15 AM [name], MD [facility]   03/26/2020  1:15 PM Naomie Dean, MD [facility]   contacted pt and relayed PCP's response.  Pt is very appreciative of the call.  Advise pt to call back if symptoms gets worst. Strict ER precaution advise and pt understands.  Closing encounter.

## 2020-01-20 NOTE — Telephone Encounter (Signed)
PROVIDER ACTION REQUESTED: Yes, please review  Action item needed:   Requesting appnt with PCP only.  Appt scheduled: No  Future Appointments   Date Time Provider Delia   03/26/2020  1:15 PM Naomie Dean, MD [facility]     Chief Complaint   Heart palpitations last night for like an hour  Assessment details:  Hx of ablation ( for Afib) few years ago and was good after that. Not taking diltiazem everyday.  Pt reports been in a lot of stress lately. Last night he develop a period of AFIb with some dizziness and SOB. Pt took aspirin and diltiazem and rest that breaks his afib.  Wake up today with no symptoms, HR is 57 bpm. Denies CP . No dizziness reported.  Pt requesting appnt with PCP only for evaluation.  Offered sooner appnt with any PCP but declines. 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  Will route for review and advise.    CLINIC/RN/LVN ACTION REQUEST: YES, please review and advise.   Action item needed: Yes, please follow up with provider in clinic and relay POC to patient.  Thank you     Routing to Provider for review and pt given strict ED precautions and reviewed all applicable Home Care Advice per protocol.     2020 FLU VACCINE: YES  COVID TRIAGE PROTOCOL: NEGATIVE       Reason for Call: Heart Palpitations    Disposition: See PCP When Office is Open (Within 3 Days)     Reason for Disposition  . [1] Palpitations AND [2] no improvement after using CARE ADVICE    Additional Information  . Negative: Passed out (i.e., lost consciousness, collapsed and was not responding)  . Negative: Shock suspected (e.g., cold/pale/clammy skin, too weak to stand, low BP, rapid pulse)  . Negative: Difficult to awaken or acting confused (e.g., disoriented, slurred speech)  . Negative: Visible sweat on face or sweat dripping down face  . Negative: Unable to walk, or can only walk with assistance (e.g., requires support)  . Negative: [1] Received SHOCK from implantable cardiac  defibrillator AND [2] persisting symptoms (i.e., palpitations, lightheadedness)  . Negative: [1] Dizziness, lightheadedness, or weakness AND [2] heart beating very rapidly (e.g., > 140 / minute)  . Negative: [1] Dizziness, lightheadedness, or weakness AND [2] heart beating very slowly  (e.g., < 50 / minute)  . Negative: Sounds like a life-threatening emergency to the triager  . Negative: Chest pain  . Negative: Difficulty breathing  . Negative: Dizziness, lightheadedness, or weakness  . Negative: [1] Heart beating very rapidly (e.g., > 140 / minute) AND [2] present now  (Exception: during exercise)  . Negative: Heart beating very slowly (e.g., < 50 / minute)  (Exception: athlete)  . Negative: New or worsened shortness of breath with activity (dyspnea on exertion)  . Negative: Patient sounds very sick or weak to the triager  . Negative: [1] Heart beating very rapidly (e.g., > 140 / minute) AND [2] not present now  (Exception: during exercise)  . Negative: [1] Skipped or extra beat(s) AND [2] increases with exercise or exertion  . Negative: [1] Skipped or extra beat(s) AND [2] occurs 4 or more times per minute  . Negative: New or worsened ankle swelling  . Negative: History of heart disease  (i.e., heart attack, bypass surgery, angina, angioplasty, CHF) (Exception: brief heart beat symptoms that went away and now feels well)  . Negative:  Age > 60 years (Exception: brief heart beat symptoms that went away and now feels well)  . Negative: Taking water pill (i.e., diuretic) or heart medication (e.g., digoxin)  . Negative: Wearing a "Holter monitor" or "cardiac event monitor"  . Negative: [1] Received SHOCK from implantable cardiac defibrillator AND [2] now feels well  . Negative: History of hyperthyroidism or taking thyroid medication  . Negative: Known or suspected substance abuse (e.g., cocaine, alcohol abuse)  . Negative: [1] ADHD AND [2] taking stimulant medication    Answer Assessment - Initial Assessment  Questions  1. DESCRIPTION: "Please describe your heart rate or heart beat that you are having" (e.g., fast/slow, regular/irregular, skipped or extra beats, "palpitations")       A period of A fib ,was dizzy and has SOB . Theres buzzing to his ears  2. ONSET: "When did it start?" (Minutes, hours or days)        Last night for like an hour  3. DURATION: "How long does it last" (e.g., seconds, minutes, hours)      Like an hour after taking aspirin and diltiazem   4. PATTERN "Does it come and go, or has it been constant since it started?"  "Does it get worse with exertion?"   "Are you feeling it now?"         Pt feeling OK  5. TAP: "Using your hand, can you tap out what you are feeling on a chair or table in front of you, so that I can hear?" (Note: not all patients can do this)        ok  6. HEART RATE: "Can you tell me your heart rate?" "How many beats in 15 seconds?"  (Note: not all patients can do this)        HR right now is 57bpm  7. RECURRENT SYMPTOM: "Have you ever had this before?" If so, ask: "When was the last time?" and "What happened that time?"        Yes, had ablation 2 years ago and has been good, not taking diltiazem  8. CAUSE: "What do you think is causing the palpitations?"       Hx of Afib - last seen by Dr Rigoberto Noel few years ago after ablation.  9. CARDIAC HISTORY: "Do you have any history of heart disease?" (e.g., heart attack, angina, bypass surgery, angioplasty, arrhythmia)        Ablation , arrythmia  10. OTHER SYMPTOMS: "Do you have any other symptoms?" (e.g., dizziness, chest pain, sweating, difficulty breathing)        None at this time  11. PREGNANCY: "Is there any chance you are pregnant?" "When was your last menstrual period?"       N/a  Pt is on a lot stress lately.  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  And reports he is not in immediate danger at this time.  Requesting appnt with PCP only.    Protocols used: HEART RATE AND HEARTBEAT QUESTIONS-A-AH

## 2020-01-20 NOTE — Telephone Encounter (Signed)
I have attempted to contact this patient by phone with the following results: called patient in regard to my chart message, no answer, left voicemail to return call to clinic, phone number included..  Mychart message also sent to pt.     CALL CENTER:  When patient calls back:  Any RN can take the call  Please transfer to red flag line as message includes red flag symptoms     Future Appointments   Date Time Provider [place]   03/26/2020  1:15 PM Naomie Dean, MD [facility]

## 2020-01-24 ENCOUNTER — Encounter (INDEPENDENT_AMBULATORY_CARE_PROVIDER_SITE_OTHER): Payer: Self-pay | Admitting: Family Medicine

## 2020-01-24 ENCOUNTER — Encounter (HOSPITAL_COMMUNITY): Payer: Self-pay

## 2020-01-24 ENCOUNTER — Other Ambulatory Visit: Payer: Commercial Managed Care - HMO | Attending: Family Medicine

## 2020-01-24 ENCOUNTER — Ambulatory Visit (INDEPENDENT_AMBULATORY_CARE_PROVIDER_SITE_OTHER): Payer: Commercial Managed Care - HMO | Admitting: Family Medicine

## 2020-01-24 VITALS — BP 118/72 | HR 69 | Temp 97.6°F | Ht 72.0 in | Wt 218.0 lb

## 2020-01-24 DIAGNOSIS — R002 Palpitations: Secondary | ICD-10-CM | POA: Insufficient documentation

## 2020-01-24 DIAGNOSIS — J45909 Unspecified asthma, uncomplicated: Secondary | ICD-10-CM

## 2020-01-24 DIAGNOSIS — Z789 Other specified health status: Secondary | ICD-10-CM

## 2020-01-24 DIAGNOSIS — F411 Generalized anxiety disorder: Secondary | ICD-10-CM

## 2020-01-24 DIAGNOSIS — R0600 Dyspnea, unspecified: Secondary | ICD-10-CM

## 2020-01-24 LAB — COMPREHENSIVE METABOLIC PANEL, BLOOD
ALT (SGPT): 12 U/L (ref 0–41)
AST (SGOT): 20 U/L (ref 0–40)
Albumin: 4.9 g/dL (ref 3.5–5.2)
Alkaline Phos: 87 U/L (ref 40–129)
Anion Gap: 9 mmol/L (ref 7–15)
BUN: 9 mg/dL (ref 6–20)
Bicarbonate: 27 mmol/L (ref 22–29)
Bilirubin, Tot: 0.69 mg/dL (ref <1.2)
Calcium: 10.6 mg/dL (ref 8.5–10.6)
Chloride: 101 mmol/L (ref 98–107)
Creatinine: 0.76 mg/dL (ref 0.67–1.17)
GFR: >60 mL/min
Glucose: 100 mg/dL — ABNORMAL HIGH (ref 70–99)
Potassium: 5.2 mmol/L — ABNORMAL HIGH (ref 3.5–5.1)
Sodium: 137 mmol/L (ref 136–145)
Total Protein: 7.9 g/dL (ref 6.0–8.0)

## 2020-01-24 LAB — CBC WITH DIFF, BLOOD
ANC-Automated: 3.2 10*3/uL (ref 1.6–7.0)
Abs Basophils: 0 10*3/uL
Abs Eosinophils: 0.2 10*3/uL (ref 0.0–0.5)
Abs Lymphs: 2.4 10*3/uL (ref 0.8–3.1)
Abs Monos: 0.3 10*3/uL (ref 0.2–0.8)
Basophils: 1 %
Eosinophils: 3 %
Hct: 44.9 % (ref 40.0–50.0)
Hgb: 15.1 gm/dL (ref 13.7–17.5)
Lymphocytes: 39 %
MCH: 28.5 pg (ref 26.0–32.0)
MCHC: 33.6 g/dL (ref 32.0–36.0)
MCV: 84.7 um3 (ref 79.0–95.0)
MPV: 10.6 fL (ref 9.4–12.4)
Monocytes: 5 %
Plt Count: 153 10*3/uL (ref 140–370)
RBC: 5.3 10*6/uL (ref 4.60–6.10)
RDW: 14.1 % — ABNORMAL HIGH (ref 12.0–14.0)
Segs: 52 %
WBC: 6.2 10*3/uL (ref 4.0–10.0)

## 2020-01-24 LAB — TSH, BLOOD: TSH: 1.74 u[IU]/mL (ref 0.27–4.20)

## 2020-01-24 LAB — FREE THYROXINE, BLOOD: Free T4: 1.23 ng/dL (ref 0.93–1.70)

## 2020-01-24 LAB — MAGNESIUM, BLOOD: Magnesium: 2.3 mg/dL (ref 1.6–2.6)

## 2020-01-24 LAB — TOTAL T3, BLOOD: T3 Total: 1.4 ng/mL (ref 0.8–2.0)

## 2020-01-24 LAB — PHOSPHORUS, BLOOD: Phosphorous: 3.6 mg/dL (ref 2.7–4.5)

## 2020-01-24 MED ORDER — HYDROXYZINE HCL 25 MG OR TABS
25.0000 mg | ORAL_TABLET | Freq: Three times a day (TID) | ORAL | 0 refills | Status: DC | PRN
Start: 2020-01-24 — End: 2020-02-20

## 2020-01-24 NOTE — Progress Notes (Signed)
Family Medicine Clinic Progress Note    CC:   Chief Complaint   Patient presents with   . Palpitations   . Shortness of Breath         Subjective: Brett Palmer is a 54 year old male who is here for CC above.      While at rest, last week, Pt developed a sudden onset of dyspnea, some fluttering in his chest and pain between his shoulder blades. Episode lasted for ~3 hours. He thought was due to anxiety attack. His O2 sat fell to 85-89% and he was tachy, then brady. He has been using Flovent plus albuterol.     He has been having more palpitations lately in general with some associated dyspnea in the last 4 months, but none to this degree.     He has been having a lot of dust exposure from the [employer]    He did set up the heart monitoring function on his [employer] since then, down to 40s when sleeping and up to 110s with activity. He has noticed some more T wave inversions periodically      He has been physically active without issue  Has been losing more weight intentionally, eating a more plant-based diet, no red meat. His wife was concerned he might be getting hypoglycemic.     Sleep has been disruptive, has not been able to get the new sleep med that was recommended by his psych as they didn't have access to his recent liver enzymes we had ordered. He does have anxiety without the usual coping measures he has accessed in the past. 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 Has been following with psych at [facility] but has had some communication issues. They have been working on transition from Xanax to [place]. He has been trying to ration his benzo as he is not on the regimen that was agreed upon. He is currently on 1.5mg  Xanax and 1.5mg  Ativan total daily but says this is less than he thought he was supposed to be taking. Previously was on total daily 3mg  Xanax and 1mg  Ativan.       Wt Readings from Last 5 Encounters:   01/24/20 98.9 kg (218 lb)   09/22/19 109 kg (240 lb 3.2 oz)   04/05/19 121 kg (266 lb 12.8 oz)    04/04/19 119.1 kg (262 lb 8 oz)   11/17/18 119.7 kg (264 lb)         ROS: as per HPI    PMH reviewed/updated.    Patient Active Problem List   Diagnosis   . Hypertensive disorder   . GAD (generalized anxiety disorder)   . Palpitations   . ETOH abuse   . PTSD (post-traumatic stress disorder)   . Transaminitis   . Inadequate exercise - not at goal   . Alcoholic cirrhosis of liver without ascites (CMS-HCC)   . Alcohol withdrawal syndrome with complication (CMS-HCC)   . Thrombocytopenia (CMS-HCC)   . Gastrointestinal hemorrhage, unspecified gastrointestinal hemorrhage type   . Adequate exercise, at or above goal   . Need for immunization against viral hepatitis       Medications reviewed with patient and medication list reconciled.   Patient's understanding and response to medications assessed.     Outpatient Medications Prior to Visit   Medication Sig Dispense Refill   . albuterol 108 (90 Base) MCG/ACT inhaler Inhale 2 puffs by mouth every 6 hours as needed for Wheezing. 3 Inhaler 3   . alprazolam (  XANAX) 2 MG tablet Take 4 mg by mouth nightly as needed for Insomnia.     [name] aluminum chloride (DRYSOL) 20 % external solution Apply to clean dry surface of areas that sweat a lot at armpits, palms, soles once daily at night 60 mL 1   . econazole (SPECTAZOLE) 1 % cream Apply to buttock rash bid for 2-4 weeks until it clears 30 g 1   . fluticasone propionate (FLONASE) 50 MCG/ACT nasal spray Spray 2 sprays into each nostril daily. 1 bottle 11   . ketoconazole (NIZORAL) 2 % cream Apply to buttock rash bid for 2-4 weeks til it clears 30 g 1   . LORazepam (ATIVAN) 1 MG tablet Take 1 tablet (1 mg) by mouth every 6 hours as needed for Anxiety. 0 tablet 0   . omeprazole (PRILOSEC) 20 MG capsule Take 1 capsule (20 mg) by mouth 2 times daily (before meals). 60 capsule 1     No facility-administered medications prior to visit.        Immunization History   Administered Date(s) Administered   . (Shingles) Herpes Zoster Recombinant  Vaccine [facility]) 09/22/2019   . Influenza Vaccine (Unspecified) 08/31/2012, 12/02/2017   . Influenza Vaccine >=6 Months 10/04/2012, 11/06/2014, 09/03/2015, 09/06/2016, 09/22/2019   . Pneumococcal 23 Vaccine (PNEUMOVAX-23) 11/06/2014, 09/15/2016   . Tdap 09/15/2016   . Tetanus/Diptheria Vaccine 01/01/2003, 07/01/2012   . Tuberculin PPD 11/29/2014     Allergies   Allergen Reactions   . Compazine Anxiety     Tolerates phenergan and zofran   . Latex Rash   . Gabapentin Other     "crawl out of my skin"   . Norco [Hydrocodone-Acetaminophen] Itching   . Vicodin [Apap-Fd&C Yellow #10 [place]-Hydrocodone] Anxiety     Tolerates morphine and dilaudid       Objective:  Vitals:    01/24/20 1013   BP: 118/72   Pulse: 69   Temp: 97.6 F (36.4 C)   TempSrc: Oral   SpO2: 98%   Weight: 98.9 kg (218 lb)   Height: 6' (1.829 m)     Estimated body mass index is 29.57 kg/m as calculated from the following:    Height as of this encounter: 6' (1.829 m).    Weight as of this encounter: 98.9 kg (218 lb).        General Appearance: healthy, alert, no distress, pleasant affect, cooperative.  Neck:  Neck supple. No adenopathy. Prominent thyroid, no apparent nodules  Heart:  normal rate and regular rhythm, no murmurs, clicks, or gallops.  Lungs: clear to auscultation, respirations unlabored, no chest deformities noted.  Extremities:  no cyanosis, clubbing, or edema. No tremor.        Labs reviewed:  Alpha Fetoprotein, Blood - See Instructions    Collection Time: 09/22/19 12:20 PM   Result Value Ref Range    AFP 3.6 0.0 - 8.3 ng/mL   Prothrombin Time, Blood Blue    Collection Time: 09/22/19 12:20 PM   Result Value Ref Range    PT,Patient 11.8 9.7 - 12.5 sec    INR 1.1    Lipid Panel Green Plasma Separator Tube    Collection Time: 09/22/19 12:20 PM   Result Value Ref Range    Cholesterol 171 <200 mg/dL    HDL-Cholesterol 48 mg/dL    LDL-Chol (Calc) 112 <160 mg/dL    Non-HDL Cholesterol 123 mg/dL    Triglycerides 55 10 - 170 mg/dL    Comprehensive Metabolic Panel Green  Collection Time: 09/22/19 12:20 PM   Result Value Ref Range    Glucose 99 70 - 99 mg/dL    BUN 7 6 - 20 mg/dL    Creatinine 0.86 0.67 - 1.17 mg/dL    GFR >60 mL/min    Sodium 139 136 - 145 mmol/L    Potassium 4.5 3.5 - 5.1 mmol/L    Chloride 100 98 - 107 mmol/L    Bicarbonate 24 22 - 29 mmol/L    Anion Gap 15 7 - 15 mmol/L    Calcium 10.2 8.5 - 10.6 mg/dL    Total Protein 8.2 (H) 6.0 - 8.0 g/dL    Albumin 4.7 3.5 - 5.2 g/dL    Bilirubin, Tot 0.79 <1.2 mg/dL    AST (SGOT) 23 0 - 40 U/L    ALT (SGPT) 15 0 - 41 U/L    Alkaline Phos 79 40 - 129 U/L   CBC w/ Diff Lavender    Collection Time: 09/22/19 12:20 PM   Result Value Ref Range    WBC 6.2 4.0 - 10.0 1000/mm3    RBC 5.20 4.60 - 6.10 mill/mm3    Hgb 14.7 13.7 - 17.5 gm/dL    Hct 45.3 40.0 - 50.0 %    MCV 87.1 79.0 - 95.0 um3    MCH 28.3 26.0 - 32.0 pgm    MCHC 32.5 32.0 - 36.0 g/dL    RDW 13.8 12.0 - 14.0 %    MPV 10.4 9.4 - 12.4 fL    Plt Count 148 140 - 370 1000/mm3    Segs 48 %    Lymphocytes 42 %    Monocytes 5 %    Eosinophils 4 %    Basophils 1 %    ANC-Automated 3.0 1.6 - 7.0 1000/mm3    Abs Lymphs 2.6 0.8 - 3.1 1000/mm3    Abs Monos 0.3 0.2 - 0.8 1000/mm3    Abs Eosinophils 0.2 0.0 - 0.5 1000/mm3    Abs Basophils 0.0 0.0 1000/mm3    Diff Type Automated      EKG performed and personally reviewed:  NSR, normal intervals, normal axis, no ST changes, new TWI in V3-V5    Assessment/Plan: Brett Palmer is a 54 year old male with the following issues addressed:    1. Palpitations  Palpitations could be benzo taper related but definitely merits cardiac w/u given Pt's hx.  EKG today shows new TWI in lateral fields. Stress test and event monitor ordered, as well as labs below.  ED precautions reviewed.  - ECG, In Clinic; Future  - TSH, Blood - See Instructions; Future  - Free Thyroxine, Blood Green Plasma Separator Tube; Future  - Total T3, Blood Green Plasma Separator Tube; Future  - CBC w/ Diff Lavender; Future  -  Comprehensive Metabolic Panel (CMP); Future  - Magnesium, Blood Green Plasma Separator Tube; Future  - Phosphorus, Blood Green Plasma Separator Tube; Future  - Event Monitor; Future  - Exercise ECHO with Image Enhancement Agent if Necessary; Future    2. GAD (generalized anxiety disorder)  Primary management per outside psych but given poor anxiety control and difficult f/u, will add Atarax as an adjunct therapy for the time being.  - hydrOXYzine HCL (ATARAX) 25 MG tablet; Take 1 tablet (25 mg) by mouth 3 times daily as needed for Anxiety.  Dispense: 30 tablet; Refill: 0    3. Dyspnea, unspecified type  No apparent e/o hypervolemia but will eval further with echo. Has history  of dyspnea in the past, CT chest in 2018 was unremarkable. PFTs in 2018 with borderline obstructive defect. OK to continue ICS and SABA prn.  - Complete 2D ECHO with Image Enhancement Agent if Necessary; Future    4. Adequate exercise, at or above goal  PAVS Total Activity: 360 minutes/week minutes/week       I have encouraged Mr. Godman to maintain his current physical activity.      Patient Instructions    Labs on your way out   Schedule stress test and echo   Event monitor will be mailed to you   Hydroxyzine prn anxiety/panic attacks      Barriers to Learning assessed: none. Patient verbalizes understanding of teaching and instructions.    Return in about 6 weeks (around 03/06/2020) for follow-up studies (sooner as needed).    Pre-Visit: 3 minutes spent reviewing last visit, recent labs, images, etc.  Intra-Visit: 32 minutes spent updating relevant history, performing a physical exam, creating a treatment plan, medical discussion with patient  Post-Visit: 5 minutes spent in note completion, placing of orders, coordination of care  Total duration of encounter spent in the above activities, excluding separately reportable services/procedures: 40 minutes    [name], MD

## 2020-01-24 NOTE — Patient Instructions (Addendum)
   Labs on your way out   Schedule stress test and echo   Event monitor will be mailed to you   Hydroxyzine prn anxiety/panic attacks

## 2020-01-24 NOTE — Interdisciplinary (Signed)
Blood drawn from left arm with 21 gauge needle. 2 tubes taken.   Patient identity authenticated by [name].

## 2020-01-25 DIAGNOSIS — R9431 Abnormal electrocardiogram [ECG] [EKG]: Secondary | ICD-10-CM

## 2020-01-25 DIAGNOSIS — I2109 ST elevation (STEMI) myocardial infarction involving other coronary artery of anterior wall: Secondary | ICD-10-CM

## 2020-01-25 LAB — ECG 12-LEAD
ATRIAL RATE: 66 {beats}/min
ECG INTERPRETATION: NORMAL
P AXIS: 11 degrees
PR INTERVAL: 162 ms
QRS INTERVAL/DURATION: 94 ms
QT: 414 ms
QTc (Bazett): 434 ms
R AXIS: 54 degrees
T AXIS: 25 degrees
VENTRICULAR RATE: 66 {beats}/min

## 2020-02-05 ENCOUNTER — Encounter (INDEPENDENT_AMBULATORY_CARE_PROVIDER_SITE_OTHER): Payer: Self-pay | Admitting: Family Medicine

## 2020-02-05 MED ORDER — FLUTICASONE PROPIONATE  HFA 110 MCG/ACT IN AERO
2.0000 | INHALATION_SPRAY | Freq: Two times a day (BID) | RESPIRATORY_TRACT | Status: AC
Start: 2020-02-05 — End: ?

## 2020-02-10 ENCOUNTER — Ambulatory Visit (INDEPENDENT_AMBULATORY_CARE_PROVIDER_SITE_OTHER): Payer: Medicare (Managed Care) | Admitting: Dermatology

## 2020-02-10 ENCOUNTER — Encounter (INDEPENDENT_AMBULATORY_CARE_PROVIDER_SITE_OTHER): Payer: Self-pay | Admitting: Hospital

## 2020-02-10 ENCOUNTER — Ambulatory Visit (INDEPENDENT_AMBULATORY_CARE_PROVIDER_SITE_OTHER): Payer: Self-pay | Admitting: Family Medicine

## 2020-02-13 ENCOUNTER — Telehealth (HOSPITAL_COMMUNITY): Payer: Self-pay | Admitting: Family Medicine

## 2020-02-13 NOTE — Telephone Encounter (Signed)
[employer] ordered to be mailed. For more information on delivery status please contact [employer] at [phone].

## 2020-02-14 NOTE — Telephone Encounter (Signed)
From: [name]  To: [name], MD  Sent: 02/10/2020 7:58 PM PST  Subject: 2-Procedural Question    Dr. Salvadore Dom,    I have made my appointments for cardiology and have been waiting for the halter monitor to arrive. I was told it would be mailed to me. I called back to your office twice. The first time, I was told that your nurse would call me back. She never did. I called a second time and spoke to the office person who said it showed in my chart on her end that I already received it, which I did not. She was going to have your nurse call me and again, no phone call. I had to cancel my airline ticket because I did not want to travel and not have the monitor.    I am not sure where the bottleneck is in this situation and I am trying to be considerate of your time. Is there something that can be done so I can complete this test? Was I supposed to pick it up? This was back on February 23.     Please let me know.    Best Regards,    Waymond Cera

## 2020-02-14 NOTE — Telephone Encounter (Signed)
No previous encounter in patients chart regarding referral [id number].  Routing to referrals department for advise.

## 2020-02-16 ENCOUNTER — Encounter (INDEPENDENT_AMBULATORY_CARE_PROVIDER_SITE_OTHER): Payer: Self-pay

## 2020-02-16 ENCOUNTER — Encounter (INDEPENDENT_AMBULATORY_CARE_PROVIDER_SITE_OTHER): Payer: Self-pay | Admitting: Hospital

## 2020-02-17 ENCOUNTER — Encounter (INDEPENDENT_AMBULATORY_CARE_PROVIDER_SITE_OTHER): Payer: Self-pay | Admitting: Family Medicine

## 2020-02-17 NOTE — Telephone Encounter (Signed)
Discussed with Vivien Rossetti, she would like to have pt screened for Sx's and call routed to triage.    Florene Route- MA

## 2020-02-17 NOTE — Telephone Encounter (Signed)
PROVIDER ACTION REQUESTED: No, FYI only   Action item needed:  None    Appt scheduled:   Yes  Future Appointments   Date Time Provider [place]   02/20/2020 10:00 AM Georges Lynch, NP [facility] [place] [facility]   03/01/2020  8:00 AM [place] STRESS ECHO ROOM [facility]   03/01/2020  9:00 AM [place] STRESS ECHO ROOM [facility]   03/26/2020  1:15 PM Paravar, Rosalie Gums, MD [facility]       Chief Complaint  ED follow up appt request.   Assessment details:    Currently denies sxs, needs ED f/u appt. Pt reports yesterday went for hike, had sudden onset nausea, ears ringing, shoulder pain and immediate need to have bm (similar sxs to past when he was in SVT). Went to br and it subsided, but soon returned. Called medics and went to closest to ED. Had HR 150s, troponin was elevated. EKG shows possible ventricular hypertrophy. On evaluated by NP, was small remote ED. Pt reports current HR 90. No cp, sob, nausea, no shoulder pain. Asside fro hx SVT has intermittent missed beats, which has been evaluated by PCP. Pt reports he has stress test scheduled and is still waiting for Halter monitor ordered by PCP MD Salvadore Dom at end of February. Pt provided home care instructions and strict ed precautions. Medical history and medications reviewed.     Pt will bring ED workup to appt with NP. Per your clinic's guidelines, I have scheduled this patient for a F2F encounter with Godino NP. If you prefer otherwise, please have your office call the patient to change their visit type. This message is only an [place].       CLINIC/RN/LVN ACTION REQUEST: NO, FYI Only  Action item needed: NO, FYI Only    Advice given, Acute appt scheduled and pt given strict ED precautions and reviewed all applicable Home Care Advice per protocol.     2020 FLU VACCINE: YES  COVID TRIAGE PROTOCOL: NEGATIVE      Symptoms w/in the last 14 days: fever or chills, cough, sob/diff breathing, fatigue, muscle or body aches, headache, loss of taste or smell, sore  throat, congestion or runny nose, nausea and vomiting, diarrhea.   Questions:   1. Looking back to the last 14 days, have you been informed by a public health agency or a healthcare agency or a healthcare system that you have been exposed to [place] (COVID-19)  2. Looking back to the last 14 days, has a person in your household been diagnosed with Coronavirus (COVID-19) infection       Reason for Call: Heart Palpitations     Disposition: See PCP When Office is Open (Within 3 Days)       Reason for Disposition  . [1] Palpitations AND [2] no improvement after using CARE ADVICE    Additional Information  . Negative: Passed out (i.e., lost consciousness, collapsed and was not responding)  . Negative: Shock suspected (e.g., cold/pale/clammy skin, too weak to stand, low BP, rapid pulse)  . Negative: Difficult to awaken or acting confused (e.g., disoriented, slurred speech)  . Negative: Visible sweat on face or sweat dripping down face  . Negative: Unable to walk, or can only walk with assistance (e.g., requires support)  . Negative: [1] Received SHOCK from implantable cardiac defibrillator AND [2] persisting symptoms (i.e., palpitations, lightheadedness)  . Negative: [1] Dizziness, lightheadedness, or weakness AND [2] heart beating very rapidly (e.g., > 140 / minute)  . Negative: [  1] Dizziness, lightheadedness, or weakness AND [2] heart beating very slowly  (e.g., < 50 / minute)  . Negative: Sounds like a life-threatening emergency to the triager  . Negative: Chest pain  . Negative: Difficulty breathing  . Negative: Dizziness, lightheadedness, or weakness  . Negative: [1] Heart beating very rapidly (e.g., > 140 / minute) AND [2] present now  (Exception: during exercise)  . Negative: Heart beating very slowly (e.g., < 50 / minute)  (Exception: athlete)  . Negative: New or worsened shortness of breath with activity (dyspnea on exertion)  . Negative: Patient sounds very sick or weak to the triager  . Negative: [1] Heart  beating very rapidly (e.g., > 140 / minute) AND [2] not present now  (Exception: during exercise)  . Negative: [1] Skipped or extra beat(s) AND [2] increases with exercise or exertion  . Negative: [1] Skipped or extra beat(s) AND [2] occurs 4 or more times per minute  . Negative: New or worsened ankle swelling  . Negative: History of heart disease  (i.e., heart attack, bypass surgery, angina, angioplasty, CHF) (Exception: brief heart beat symptoms that went away and now feels well)  . Negative: Age > 60 years (Exception: brief heart beat symptoms that went away and now feels well)  . Negative: Taking water pill (i.e., diuretic) or heart medication (e.g., digoxin)  . Negative: Wearing a "Holter monitor" or "cardiac event monitor"  . Negative: [1] Received SHOCK from implantable cardiac defibrillator AND [2] now feels well  . Negative: History of hyperthyroidism or taking thyroid medication  . Negative: Known or suspected substance abuse (e.g., cocaine, alcohol abuse)  . Negative: [1] ADHD AND [2] taking stimulant medication    Answer Assessment - Initial Assessment Questions  1. DESCRIPTION: "Please describe your heart rate or heart beat that you are having" (e.g., fast/slow, regular/irregular, skipped or extra beats, "palpitations")    yesterday went for hike, had sudden onset nausea, ears ringing, shoulder pain and immediate need to have bm (similar sxs to past when he was in SVT). Went to br and it subsided, but soon returned. Called medics and went to closest to ED. They ordered halter monitor. Reports HR 90. No cp, sob, nausea, no shoulder pain.     2. ONSET: "When did it start?" (Minutes, hours or days)       Yesterday    3. DURATION: "How long does it last" (e.g., seconds, minutes, hours)      N/a    4. PATTERN "Does it come and go, or has it been constant since it started?"  "Does it get worse with exertion?"   "Are you feeling it now?"      Has occasional feeling of missed beats intermittently. With apple  watch he is monitoring HR.     5. TAP: "Using your hand, can you tap out what you are feeling on a chair or table in front of you, so that I can hear?" (Note: not all patients can do this)        [place]    6. HEART RATE: "Can you tell me your heart rate?" "How many beats in 15 seconds?"  (Note: not all patients can do this)        HR 74-90    7. RECURRENT SYMPTOM: "Have you ever had this before?" If so, ask: "When was the last time?" and "What happened that time?"       Yes, SVT    8. CAUSE: "What do you think is causing  the palpitations?"      unk  9. CARDIAC HISTORY: "Do you have any history of heart disease?" (e.g., heart attack, angina, bypass surgery, angioplasty, arrhythmia)       Yes.     10. OTHER SYMPTOMS: "Do you have any other symptoms?" (e.g., dizziness, chest pain, sweating, difficulty breathing)        None    11. PREGNANCY: "Is there any chance you are pregnant?" "When was your last menstrual period?"        n/a    Protocols used: HEART RATE AND HEARTBEAT QUESTIONS-A-AH

## 2020-02-17 NOTE — Telephone Encounter (Signed)
Pt called office, call transferred to me. Pt states no one has called him to give him an update and it has been well over a couple of weeks since he has been calling and leaving messages regarding the monitor. Tried to call # on referral for heart monitor while pt was on hold [phone]. Was told monitor was shipped 02/13/20 and # to check status for shipping is [phone]. Informed pt of status and he says at this point the monitor is not significant right now. At this point he just wants a follow up plan from Dr Salvadore Dom from the even he had going on (medical care in [facility]) His troponin went from 0.05 to 0.07 in one hour. He also had an EKG done. He will be sending pictures of his results and EKG so that we can have a little more information. What is his next step?    Florene Route- MA

## 2020-02-17 NOTE — Telephone Encounter (Signed)
Who is calling: Incoming call from patient  Insurance Coverage Verified: Active- in network  Reason for this call: Pt called requesting status of his heart monitor that was to be ordered 01/24/20. Pt has sent mychart messages without a response back from office. Pt has been waiting for his monitor. Pt had an event in [place] that put him in the ER. Pt would like a call back regarding his heart monitor. Please assist.    Action required by office: Please contact caller    Duplicate encounter? No previous documentation found on this issue.     Best way to contact: [phone]  Alternative: [phone]    Inquiry has been read verbatim to this caller. Verbalizes satisfaction and confirms the above is accurate: yes      Has been advised this message will be transmitted to office and can expect a response within the next 24-72 hours.

## 2020-02-18 ENCOUNTER — Other Ambulatory Visit: Payer: Self-pay

## 2020-02-19 NOTE — Progress Notes (Signed)
TRUST LEH  MRN: [id number]  DOB: [birth year]  PMD: Brett Palmer    Chief Complaint: ED Follow Up    HPI:  Brett Palmer is a 54 year old male who presents for an ED Follow Up. Per triage note from 02/10/20, the "patient currently denies symptoms, needs ED follow up appointment. He had reported that he went for a hike yesterday (02/09/20), and had sudden onset nausea, ears ringing, shoulder pain and immediately needed to have a bowel movement. He had symptoms like this prior when he had gone into SVT. After bathroom, symptoms subsided and then came back again. Due to this, he called the medics and went to the closest ED. His heart rate was in the 150's and his troponin was elevated. His EKG showed "possible ventricular hypertrophy." Patient reports he has a stress test scheduled (03/01/20) as well as an Echo (03/01/20) and is still waiting for the Holter monitor ordered by Dr. Salvadore Dom at the end of February. At time of triage, was not currently having any symptoms.     ED Note (02/16/20) [facility] in [facility]:    EKG (02/16/20):     "Sinus tachycardia. Consider right ventricular hypertrophy."     Bloodwork results:    1) "Abnormal CBC  2) Elevated AST  3) Elevated Troponin"     Last Chart Note (01/24/20):    [name] Palpitations   . Shortness of Breath     Subjective: Brett Palmer is a 54 year old male who is here for CC above.    "While at rest, last week, Pt developed a sudden onset of dyspnea, some fluttering in his chest and pain between his shoulder blades. Episode lasted for ~3 hours. He thought was due to anxiety attack. His O2 sat fell to 85-89% and he was tachy, then brady. He has been using Flovent plus albuterol.     He has been having more palpitations lately in general with some associated dyspnea in the last 4 months, but none to this degree.     He has been having a lot of dust exposure from the [employer]    He did set up the heart monitoring function on  his Apple Watch since then, down to 31s when sleeping and up to 110s with activity. He has noticed some more T wave inversions periodically      He has been physically active without issue  Has been losing more weight intentionally, eating a more plant-based diet, no red meat. His wife was concerned he might be getting hypoglycemic.     Sleep has been disruptive, has not been able to get the new sleep med that was recommended by his psych as they didn't have access to his recent liver enzymes we had ordered. He does have anxiety without the usual coping measures he has accessed in the past. 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 Has been following with psych at [facility] but has had some communication issues. They have been working on transition from Xanax to [place]. He has been trying to ration his benzo as he is not on the regimen that was agreed upon. He is currently on 1.'5mg'$  Xanax and 1.'5mg'$  Ativan total daily but says this is less than he thought he was supposed to be taking. Previously was on total daily '3mg'$  Xanax and '1mg'$  Ativan.     Assessment/Plan: Brett Palmer is a 54 year old male with the following issues addressed:    1. Palpitations  Palpitations could be benzo taper related but definitely merits cardiac w/u given Pt's hx.  EKG today shows new TWI in lateral fields. Stress test and event monitor ordered, as well as labs below.  ED precautions reviewed.  - ECG, In Clinic; Future  - TSH, Blood - See Instructions; Future  - Free Thyroxine, Blood Green Plasma Separator Tube; Future  - Total T3, Blood Green Plasma Separator Tube; Future  - CBC w/ Diff Lavender; Future  - Comprehensive Metabolic Panel (CMP); Future  - Magnesium, Blood Green Plasma Separator Tube; Future  - Phosphorus, Blood Green Plasma Separator Tube; Future  - Event Monitor; Future  - Exercise ECHO with Image Enhancement Agent if Necessary; Future    2. GAD (generalized anxiety disorder)  Primary management per outside psych but given poor anxiety  control and difficult f/u, will add Atarax as an adjunct therapy for the time being.  - hydrOXYzine HCL (ATARAX) 25 MG tablet; Take 1 tablet (25 mg) by mouth 3 times daily as needed for Anxiety.  Dispense: 30 tablet; Refill: 0    3. Dyspnea, unspecified type  No apparent e/o hypervolemia but will eval further with echo. Has history of dyspnea in the past, CT chest in 2018 was unremarkable. PFTs in 2018 with borderline obstructive defect. OK to continue ICS and SABA prn.  - Complete 2D ECHO with Image Enhancement Agent if Necessary; Future    4. Adequate exercise, at or above goal  PAVS Total Activity: 360 minutes/week minutes/week       I have encouraged Mr. Todt to maintain his current physical activity."    Lab Results   Component Value Date    WBC 6.2 01/24/2020    RBC 5.30 01/24/2020    HGB 15.1 01/24/2020    HCT 44.9 01/24/2020    MCV 84.7 01/24/2020    MCHC 33.6 01/24/2020    RDW 14.1 (H) 01/24/2020    PLT 153 01/24/2020    MPV 10.6 01/24/2020     Lab Results   Component Value Date    BUN 9 01/24/2020    CREAT 0.76 01/24/2020    CL 101 01/24/2020    NA 137 01/24/2020    K 5.2 (H) 01/24/2020    [place] 10.6 01/24/2020    TBILI 0.69 01/24/2020    ALB 4.9 01/24/2020    TP 7.9 01/24/2020    AST 20 01/24/2020    ALK 87 01/24/2020    BICARB 27 01/24/2020    ALT 12 01/24/2020    GLU 100 (H) 01/24/2020     Phosphorus: 3.6 (normal)  Magnesium: 2.3 (normal)    Lab Results   Component Value Date    TSH 1.74 01/24/2020     Free T4: 1.23 (normal)  Total T3: 1.4 (normal)     EKG (01/24/20):    ECG INTERPRETATION   Final MUSE L...   Normal sinus rhythm   Anterior infarct , age undetermined   Abnormal ECG      Last Echo (01/20/2017):     Summary:   1. "Technically difficult study with suboptimal images.   2. The left ventricular size is mildly increased and the left ventricular systolic function is normal.   3. No left ventricular hypertrophy.   4. The right ventricular size is mildly enlarged and systolic function is normal.    5. No significant valvular abnormalities.   6. Rcommend use of IV contrast agent to better identify endocardial borders if clinically indicated.   7. Compared to prior  study no significant change."    Current Symptoms Today (02/20/20): Patient reports today that the majority of his symptoms started mid February around 01/24/20 when patient last saw his PCP, Dr. Salvadore Dom. At that time, a stress test, echo, as well as holter monitor were all ordered - none have been done. He has an appointment for his Echo as well as his stress test on 03/01/20, and will re order Holter today and have patient call to schedule an appointment. He has a phone app for his heart which shows that he is going in and out of Atrial Fibrillation. His pulse ox will also change with his heart rate. Drinks a hydration drink daily, and just realized how much potassium is in it once I informed him of his elevated potassium back in February. States that he will start to decrease this. He has been taking beet root and zinc supplements "forever," so does not think this is related. Largest change that he can think of is that the [place] is trying to wean him off of Ativan and Xanax. He is currently on Ativan (1 mg) a night per patient, as well as Xanax (1.5 mg nightly). When the [employer] started to taper the medications is when patient reports that he started to notice the symptoms. He sees a therapist from the [place] as well as Psychiatry - upcoming appt on the 24th. Patient states that when he gets his "cardiac attacks" he will experience bilateral ear ringing, dizziness, fullness of ears, shortness of breath, and radiating left shoulder pain. He does not experience any chest pain or tightness, "just a fluttering type of feeling." Denies nausea/vomiting. Denies fevers, URI symptoms, cough, chest pain, abdominal pain, and swelling to the bilateral lower extremities      Hx. Of Cardiac Ablation in 2016   Family History: Cousin passed of sudden cardiac death (68's); no  other history.   Diet: Low sugar greek yogurt, raisins and banana in the AM; Lunch - chicken breast and vegetables (cannot eat red meat); Dinner - protein - tries to avoid processed  Exercise: 7 mile hike and 5,000 feet of gain and loss in 4 hrs   Social History: Quit smoking at 54 y.o. - 1 pack every 2 days for 10 years   Sleep: 4 hrs/night  Hydration: 3 liters/day  Caffeine: 1 cup in the AM   Alcohol: NONE     PCP is Dr. Salvadore Dom     Past Medical History:   Diagnosis Date   . Alcohol use    . Gastroesophageal reflux disease    . Hypertension    . Migraine headache     part of [place]ulf war syndrome   . PTSD (post-traumatic stress disorder) 1995-present    [facility] by [facility] Joen Laura at [facility]    . SVT (supraventricular tachycardia) (CMS-HCC) 2011     Past Surgical History:   Procedure Laterality Date   . GALLBLADDER SURGERY  2011     Social History     Socioeconomic History   . Marital status: Married     Spouse name: [name]   . Number of children: 2   . Years of education: Not on file   . Highest education level: Doctorate   Occupational History   . 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 Financial resource strain: Not hard at all   . Food insecurity     Worry: Never true     Inability:  Never true   . Transportation needs     Medical: No     Non-medical: No   Tobacco Use   . Smoking status: Former Smoker     Packs/day: 0.50     Years: 10.00     Pack years: 5.00     Types: Cigarettes     Quit date: 01/11/1990     Years since quitting: 30.1   . Smokeless tobacco: Never Used   Substance and Sexual Activity   . Alcohol use: Yes     Comment: down to 1/2-3/4 bottle of wine   . Drug use: No     Comment: Denies MJ use   . Sexual activity: Yes     Partners: Female   Lifestyle   . Physical activity     Days per week: Not on file     Minutes per session: Not on file   . Stress: Not on file   Relationships   . Social Product manager on phone: More than three times a week     Gets together: Never      Attends religious service: Never     Active member of club or organization: Yes     Attends meetings of clubs or organizations: 1 to 4 times per year     Relationship status: Married   . Intimate partner violence     Fear of current or ex partner: Not on file     Emotionally abused: Not on file     Physically abused: Not on file     Forced sexual activity: Not on file   Other Topics Concern   . Not on file   Social History Narrative   . Not on file     Family History   Problem Relation Name Age of Onset   . Cancer Father          melanoma, died at age 17   . Heart Disease Mother          CHF, thought to have passed from MI   . Heart Disease Maternal Aunt          CHF     Current medications:  Current Outpatient Medications   Medication Sig   . albuterol 108 (90 Base) MCG/ACT inhaler Inhale 2 puffs by mouth every 6 hours as needed for Wheezing.   [name] ALPRAZolam (XANAX) 1 MG tablet Take 1.5 pills of Xanax nightly   . aluminum chloride (DRYSOL) 20 % external solution Apply to clean dry surface of areas that sweat a lot at armpits, palms, soles once daily at night   . fluticasone (FLOVENT HFA) 110 MCG/ACT inhaler Inhale 2 puffs by mouth 2 times daily. Rinse mouth after use   . LORazepam (ATIVAN) 1 MG tablet Take 1 tablet (1 mg) by mouth nightly.   [name] omeprazole (PRILOSEC) 20 MG capsule Take 1 capsule (20 mg) by mouth 2 times daily (before meals).     No current facility-administered medications for this visit.      Review of Systems   Constitutional: Negative for chills, diaphoresis, fatigue and fever.   HENT: Negative.    Eyes: Negative.    Respiratory: Negative for cough, chest tightness, shortness of breath and wheezing.    Cardiovascular: Positive for palpitations. Negative for chest pain and leg swelling.   Gastrointestinal: Negative for abdominal pain, anal bleeding, blood in stool, nausea and vomiting.   Endocrine: Negative.    Allergic/Immunologic: Negative for immunocompromised  state.   Neurological: Negative  for dizziness, tremors, seizures, syncope, speech difficulty, weakness, light-headedness, numbness and headaches.   Hematological: Negative for adenopathy. Does not bruise/bleed easily.   Psychiatric/Behavioral: Negative.       Physical Examination:    02/20/20  1017   BP: 116/70   Pulse: 95   Resp: 18   Temp: 97.8 F (36.6 C)   SpO2: 97%     General:  Well appearing, pleasant, calm, cooperative and in no acute distress at this time.   Eyes:  Anicteric. No erythema, edema or drainage present. EOM's intact.   Lungs CTAB. No rales, rhonchi, or wheezing. Regular respiratory rate without accessory muscle use present. Speaking in full sentences without shortness of breath.    Cardiovascular: RRR without M/R/G. No peripheral edema, no cyanosis. Cap refill <2 sec. Carotids equal bilaterally without bruits auscultated.   Abdominal: ND, and no abdominal bruits auscultated.   Extremities: Left Calf - 15 in and Right Calf - 14 in (States he favors his left side). No erythema, edema or ecchymosis noted. Non TTP. No warmth on palpation.   Neuro:  A&Ox3.  Normal mentation. CN's grossly intact.   Skin: Warm, dry.  Good turgor. No discoloration, lesions or rashes.      Diagnostics (if performed):    1. EKG: NSR today  2. Blood work ordered today  3. Holter monitor ordered today     Assessment/Plan:       ICD-10-CM ICD-9-CM    1. Palpitations  R00.2 785.1 Holter Monitor (24 Hours)      ECG, In Clinic      CPK, Blood Green Plasma Separator Tube      Prothrombin Time, Blood Blue   2. Elevated troponin  R77.8 790.6 Troponin T Gen 5 Green Plasma Separator Tube   3. Hyperkalemia  E87.5 276.7 Comprehensive Metabolic Panel (CMP)      ECG, In Clinic   4. Shortness of breath  R06.02 786.05 Prothrombin Time, Blood Blue   5. Anxiety  F41.9 300.00 ALPRAZolam (XANAX) 1 MG tablet      LORazepam (ATIVAN) 1 MG tablet   6. Right flank discomfort  R10.9 789.09 Urinalysis   7. Adequate exercise, at or above goal  Z78.9 V49.89    8. Leukocytosis,  unspecified type  D72.829 288.60 CBC w/ Diff Lavender     Palpitations:    1) Please follow up with Psychiatry this week as planned to discuss potential sleep medications, as this can contribute to your symptoms    2) Continue to increase fluids, limit caffeine, and do not do over strenuous exercise until stress test and echo on 03/01/20    3) Please call to schedule your Heart Monitor at:   [facility] or [facility]: [phone]   [facility]: [facility]: [phone]     4) Please go next door to get blood work done today - I will call with results as soon as I have them    5) EKG to be done in clinic today     6) Follow up immediately with ED if: dizziness, weakness, confusion, slurred speech, chest pain, shortness of breath, nausea/vomiting, etc     Right Flank Discomfort:    1) Urinalysis to be done in lab today - Will call with results    See patient d/c instructions provided  Return/ED precautions reviewed with pt - follow up PRN sxs persist/worsen/new sxs develop  Patient (or responsible party) verbalizes understanding of  plan  F/u with pcp for routine care     Supervising MD for clinical site where visit took place today: [name]

## 2020-02-20 ENCOUNTER — Encounter (INDEPENDENT_AMBULATORY_CARE_PROVIDER_SITE_OTHER): Payer: Self-pay | Admitting: Nurse Practitioner

## 2020-02-20 ENCOUNTER — Other Ambulatory Visit: Payer: No Typology Code available for payment source | Attending: Nurse Practitioner

## 2020-02-20 ENCOUNTER — Ambulatory Visit (INDEPENDENT_AMBULATORY_CARE_PROVIDER_SITE_OTHER): Payer: No Typology Code available for payment source | Admitting: Nurse Practitioner

## 2020-02-20 VITALS — BP 116/70 | HR 95 | Temp 97.8°F | Resp 18 | Wt 218.0 lb

## 2020-02-20 DIAGNOSIS — E875 Hyperkalemia: Secondary | ICD-10-CM | POA: Insufficient documentation

## 2020-02-20 DIAGNOSIS — R109 Unspecified abdominal pain: Secondary | ICD-10-CM

## 2020-02-20 DIAGNOSIS — R0602 Shortness of breath: Secondary | ICD-10-CM | POA: Insufficient documentation

## 2020-02-20 DIAGNOSIS — Z789 Other specified health status: Secondary | ICD-10-CM

## 2020-02-20 DIAGNOSIS — R778 Other specified abnormalities of plasma proteins: Secondary | ICD-10-CM

## 2020-02-20 DIAGNOSIS — R7989 Other specified abnormal findings of blood chemistry: Secondary | ICD-10-CM

## 2020-02-20 DIAGNOSIS — R002 Palpitations: Secondary | ICD-10-CM | POA: Insufficient documentation

## 2020-02-20 DIAGNOSIS — D72829 Elevated white blood cell count, unspecified: Secondary | ICD-10-CM

## 2020-02-20 DIAGNOSIS — F419 Anxiety disorder, unspecified: Secondary | ICD-10-CM

## 2020-02-20 LAB — COMPREHENSIVE METABOLIC PANEL, BLOOD
ALT (SGPT): 13 U/L (ref 0–41)
AST (SGOT): 24 U/L (ref 0–40)
Albumin: 4.7 g/dL (ref 3.5–5.2)
Alkaline Phos: 85 U/L (ref 40–129)
Anion Gap: 7 mmol/L (ref 7–15)
BUN: 5 mg/dL — ABNORMAL LOW (ref 6–20)
Bicarbonate: 29 mmol/L (ref 22–29)
Bilirubin, Tot: 0.48 mg/dL (ref <1.2)
Calcium: 10 mg/dL (ref 8.5–10.6)
Chloride: 100 mmol/L (ref 98–107)
Creatinine: 0.78 mg/dL (ref 0.67–1.17)
GFR: >60 mL/min
Glucose: 97 mg/dL (ref 70–99)
Potassium: 4.4 mmol/L (ref 3.5–5.1)
Sodium: 136 mmol/L (ref 136–145)
Total Protein: 7.7 g/dL (ref 6.0–8.0)

## 2020-02-20 LAB — CBC WITH DIFF, BLOOD
ANC-Automated: 2.9 10*3/uL (ref 1.6–7.0)
Abs Basophils: 0 10*3/uL
Abs Eosinophils: 0.3 10*3/uL (ref 0.0–0.5)
Abs Lymphs: 1.8 10*3/uL (ref 0.8–3.1)
Abs Monos: 0.3 10*3/uL (ref 0.2–0.8)
Basophils: 1 %
Eosinophils: 5 %
Hct: 44.6 % (ref 40.0–50.0)
Hgb: 14.5 gm/dL (ref 13.7–17.5)
Lymphocytes: 35 %
MCH: 28.3 pg (ref 26.0–32.0)
MCHC: 32.5 g/dL (ref 32.0–36.0)
MCV: 86.9 um3 (ref 79.0–95.0)
MPV: 10.8 fL (ref 9.4–12.4)
Monocytes: 5 %
Plt Count: 165 10*3/uL (ref 140–370)
RBC: 5.13 10*6/uL (ref 4.60–6.10)
RDW: 14 % (ref 12.0–14.0)
Segs: 54 %
WBC: 5.3 10*3/uL (ref 4.0–10.0)

## 2020-02-20 LAB — ECG 12-LEAD
ATRIAL RATE: 70 {beats}/min
ECG INTERPRETATION: NORMAL
P AXIS: 41 degrees
PR INTERVAL: 166 ms
QRS INTERVAL/DURATION: 94 ms
QT: 402 ms
QTc (Bazett): 434 ms
R AXIS: 81 degrees
T AXIS: 44 degrees
VENTRICULAR RATE: 70 {beats}/min

## 2020-02-20 LAB — URINALYSIS
Bilirubin: NEGATIVE
Blood: NEGATIVE
Glucose: NEGATIVE
Ketones: NEGATIVE
Leuk Esterase: NEGATIVE Leu/uL
Nitrite: NEGATIVE
Protein: NEGATIVE
Specific Gravity: 1.013 (ref 1.002–1.030)
Urobilinogen: NEGATIVE
pH: 7 (ref 5.0–8.0)

## 2020-02-20 LAB — PROTHROMBIN TIME, BLOOD
INR: 1.1
PT,Patient: 11.4 s (ref 9.7–12.5)

## 2020-02-20 LAB — CPK-CREATINE PHOSPHOKINASE, BLOOD: CPK: 85 U/L (ref 0–175)

## 2020-02-20 LAB — TROPONIN T GEN 5: Troponin T Gen 5: 11 ng/L (ref <22)

## 2020-02-20 MED ORDER — ALPRAZOLAM 1 MG OR TABS
3.0000 mg | ORAL_TABLET | Freq: Every evening | ORAL | 0 refills | Status: AC
Start: 2020-02-20 — End: ?

## 2020-02-20 MED ORDER — LORAZEPAM 1 MG OR TABS
1.0000 mg | ORAL_TABLET | Freq: Every evening | ORAL | 0 refills | Status: AC
Start: 2020-02-20 — End: ?

## 2020-02-20 NOTE — Patient Instructions (Addendum)
Palpitations:    1) Please follow up with Psychiatry this week as planned to discuss potential sleep medications, as this can contribute to your symptoms    2) Continue to increase fluids, limit caffeine, and do not do over strenuous exercise until stress test and echo on 03/01/20    3) Please call to schedule your Heart Monitor at:   [facility] or [facility]: [place]: [facility]: [id number]) Please go next door to get blood work done today - I will call with results as soon as I have them    5) EKG to be done in clinic today     6) Follow up immediately with ED if: dizziness, weakness, confusion, slurred speech, chest pain, shortness of breath, nausea/vomiting, etc     Right Flank Discomfort:    1) Urinalysis to be done in lab today - Will call with results

## 2020-02-20 NOTE — Interdisciplinary (Signed)
Blood drawn from right arm with 21 gauge needle. 3 tubes taken.   Patient identity authenticated by Esperanza [place].

## 2020-03-01 ENCOUNTER — Ambulatory Visit (HOSPITAL_BASED_OUTPATIENT_CLINIC_OR_DEPARTMENT_OTHER)
Admit: 2020-03-01 | Discharge: 2020-03-01 | Payer: No Typology Code available for payment source | Attending: Family Medicine | Admitting: Family Medicine

## 2020-03-01 ENCOUNTER — Encounter (INDEPENDENT_AMBULATORY_CARE_PROVIDER_SITE_OTHER): Payer: Self-pay | Admitting: Nurse Practitioner

## 2020-03-01 ENCOUNTER — Ambulatory Visit
Admission: RE | Admit: 2020-03-01 | Discharge: 2020-03-01 | Disposition: A | Discharge status: Home / Self Care | Payer: No Typology Code available for payment source | Attending: Family Medicine | Admitting: Family Medicine

## 2020-03-01 DIAGNOSIS — R002 Palpitations: Secondary | ICD-10-CM

## 2020-03-01 DIAGNOSIS — R0600 Dyspnea, unspecified: Secondary | ICD-10-CM

## 2020-03-01 LAB — 2D ECHO WITH IMAGE ENHANCEMENT AGENT IF NECESSARY
IVC Diameter: 1.69 cm
LA Volume Index: 23.2 ml/m²
LV Ejection Fraction: 64 %
PA Pressure: 26 mmHg

## 2020-03-01 MED ORDER — PERFLUTREN LIPID MICROSPHERE 0.55 MG/10 ML IV SUSP (DILUTION)
10.0000 mL | Freq: Once | INTRAVENOUS | Status: DC
Start: 2020-03-01 — End: 2020-03-01
  Administered 2020-03-01: 0.55 mg via INTRAVENOUS
  Filled 2020-03-01: qty 10

## 2020-03-01 NOTE — Telephone Encounter (Signed)
Patient Performance:  The patient exercised for 7 minutes and 55 seconds, achieving 10 METS. The  maximum stage achieved was III of the Bruce protocol.  The peak heart rate achieved was 146 bpm, which was 88 % of the predicted target  heart rate. The target heart rate was 166 bpm. Average exercise capacity for the  patients age.  The peak blood pressure during stress was 145 / 82 mmHg. The blood pressure  response to exercise was normal.  Stress was stopped due to maximal effort. The patient developed no symptoms  during the stress exam.       Wall Scoring:  Rest: All segments are normal.  Stress: All segments are normal.    EKG:  Resting EKG showed normal sinus rhythm.  There are non specific ST T wave changes seen during peak stress period.    Summary:   1. Negative stress echo for ischemia.   2. Average exercise capacity for the patients age.   3. Normal blood pressure response to exercise.      PHYSICIAN INTERPRETATION:     Left Ventricle: The left ventricular size is normal. There is normal left ventricular function. Left ventricular ejection fraction by Simpson's biplane is 64 %. There is no left ventricular hypertrophy. Normal relaxation pattern of left ventricular   diastolic filling.     Right Ventricle: The right ventricular size is normal. Global right ventricular systolic function is normal. Normal pulmonary artery pressure with right ventricular systolic pressure measuring 26 mmHg.     Left Atrium: The left atrium is normal sized.     Right Atrium: The right atrium is normal in size.     Pericardium: There is no evidence of pericardial effusion.     Mitral Valve: The mitral valve is normal in structure. Trace mitral valve regurgitation.     Tricuspid Valve: The tricuspid valve is normal in structure. Trace tricuspid regurgitation.     Aortic Valve: The aortic valve appeared normal (trileaflet). There was no indication of aortic valve regurgitation observed.     Pulmonic Valve: The pulmonic valve is  normal. There was trace pulmonic valve regurgitation observed.     Aorta: The aortic root is normal measuring 3.20 cm and 1.49 cm/m index. The ascending aorta is normal measuring 3.80 cm and 1.77 cm/m index.     Pulmonary Artery: The pulmonary artery is of normal size and origin.     Venous: The inferior vena cava was normal sized with respiratory variation greater than 50%.       Summary:   1. The left ventricular size is normal. The left ventricular systolic function is normal.   2. The right ventricular size is normal and systolic function is normal.   3. Normal pulmonary artery pressure with right ventricular systolic pressure measuring 26 mmHg.   4. No significant valvular abnormalities.   5. Compared to prior study no significant change.

## 2020-03-01 NOTE — Addendum Note (Signed)
Encounter addended by: [name] Laster, RN on: 03/01/2020 4:19 PM   Actions taken: MAR administration accepted

## 2020-03-05 ENCOUNTER — Encounter (INDEPENDENT_AMBULATORY_CARE_PROVIDER_SITE_OTHER): Payer: Self-pay | Admitting: Hospital

## 2020-03-08 ENCOUNTER — Other Ambulatory Visit: Payer: Self-pay

## 2020-03-12 ENCOUNTER — Other Ambulatory Visit: Payer: Self-pay

## 2020-03-12 NOTE — Telephone Encounter (Signed)
Health Risk Assessment Questionnaire: care navigator left voicemail  regarding HRA questionnaire for upcoming Medicare Annual Wellness Visit. Need to clarify if Pt has Medicare part B to qualify for AWV.

## 2020-03-14 ENCOUNTER — Encounter (INDEPENDENT_AMBULATORY_CARE_PROVIDER_SITE_OTHER): Payer: Self-pay

## 2020-03-14 ENCOUNTER — Telehealth (INDEPENDENT_AMBULATORY_CARE_PROVIDER_SITE_OTHER): Payer: Commercial Managed Care - HMO | Admitting: Family Medicine

## 2020-03-14 DIAGNOSIS — F411 Generalized anxiety disorder: Secondary | ICD-10-CM

## 2020-03-14 DIAGNOSIS — Z0000 Encounter for general adult medical examination without abnormal findings: Secondary | ICD-10-CM

## 2020-03-14 DIAGNOSIS — K4090 Unilateral inguinal hernia, without obstruction or gangrene, not specified as recurrent: Secondary | ICD-10-CM

## 2020-03-14 MED ORDER — HYDROXYZINE HCL 25 MG OR TABS
25.0000 mg | ORAL_TABLET | Freq: Three times a day (TID) | ORAL | 0 refills | Status: DC | PRN
Start: 2020-03-14 — End: 2020-06-08

## 2020-03-14 NOTE — Progress Notes (Signed)
Chief Complaint   Patient presents with    Wellness Visit     History of present illness: Brett Palmer is a 54 year old male who presents to this clinic for his Medicare annual wellness visit. This is the patient's follow-up visit.    SUBJECTIVE:     -He feels well overall but does still get some anxiety in the middle of the day with some palpitations, dyspnea, and sweaty palms. HR usually running 50-70, the highest is 148. Episodes last as long as 3 hours - he tries to cope with gardening or other things, though sometimes it is paralyzing a couple times a week. He was never able to fill the Atarax due to a pharmacy issue. He is only sleeping about 4.5hr, wakes up early. Watches TV to fall asleep. 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 He is following with outside psych, going well - on 3mg  Xanax nightly and 1mg  Ativan during day - the latter which he doesn't really find helpful. Reassured by stress test and echo. He did event monitor (results pending - mailed it back last week)  -Has been having a more notable bulge in groin area, reports known R inguinal hernia and interested in surgical consult  -Has been losing weight intentionally, following mostly plant-based diet    Medical and family history: The patient's past medical history, surgical history, and family history were updated today within the electronic medical record.    Patient Active Problem List    Diagnosis Date Noted    Need for immunization against viral hepatitis 10/31/2019     Hep A Ab neg in 2019. HBSAb nonreactive in 2019. Should get Hep A/B series with new PCP      Adequate exercise, at or above goal 09/22/2019    Thrombocytopenia (CMS-HCC) 04/07/2019    Gastrointestinal hemorrhage, unspecified gastrointestinal hemorrhage type 04/07/2019    Alcohol withdrawal syndrome with complication (CMS-HCC) 0000000    Alcoholic cirrhosis of liver without ascites (CMS-HCC) 09/15/2018     Inadequate exercise - not at goal 03/23/2018    Transaminitis 09/06/2015    ETOH abuse 12/15/2012    PTSD (post-traumatic stress disorder) 12/15/2012    Palpitations 12/13/2012    Hypertensive disorder 04/23/2011    GAD (generalized anxiety disorder) 04/23/2011       Past Medical History:   Diagnosis Date    Alcohol use     Gastroesophageal reflux disease     Hypertension     Migraine headache     part of gulf war syndrome    PTSD (post-traumatic stress disorder) 1995-present    treated by Dr. Joen Laura at [facility]     SVT (supraventricular tachycardia) (CMS-HCC) 2011       Past Surgical History:   Procedure Laterality Date    GALLBLADDER SURGERY  2011       Family History   Problem Relation Name Age of Onset    Cancer Father          melanoma, died at age 43    Heart Disease Mother          CHF, thought to have passed from MI    Heart Disease Maternal Aunt          CHF       Social history:  Social History     Socioeconomic History    Marital status: Married     Spouse name: [name]    Number  of children: 2    Years of education: Not on file    Highest education level: Doctorate   Occupational History    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    Allergies:  Allergies   Allergen Reactions    Compazine Anxiety     Tolerates phenergan and zofran    Latex Rash    Gabapentin Other     "crawl out of my skin"     Norco [Hydrocodone-Acetaminophen] Itching    Vicodin [Apap-Fd&C Yellow #10 [place]-Hydrocodone] Anxiety     Tolerates morphine and dilaudid       Current Outpatient Medications   Medication Sig    albuterol 108 (90 Base) MCG/ACT inhaler Inhale 2 puffs by mouth every 6 hours as needed for Wheezing.    ALPRAZolam (XANAX) 1 MG tablet Take 1.5 pills of Xanax nightly    aluminum chloride (DRYSOL) 20 % external solution Apply to clean dry surface of areas that sweat a lot at armpits, palms, soles once daily at night    fluticasone (FLOVENT HFA) 110 MCG/ACT inhaler Inhale 2 puffs by mouth 2 times daily. Rinse mouth after use    LORazepam (ATIVAN) 1 MG tablet Take 1 tablet (1 mg) by mouth nightly.    omeprazole (PRILOSEC) 20 MG capsule Take 1 capsule (20 mg) by mouth 2 times daily (before meals).     No current facility-administered medications for this visit.           Last reviewed on 02/20/2020 10:42 AM by Georges Lynch, NP      Opioid Use Assessment:  Pursuant to Health and Safety Code section 11165.4(e), beginning Oct. 2, 99991111, all DEA-licensed prescribers must consult CURES before prescribing a Schedule II, III or IV controlled substance.  Is the patient on opioids: No    Diet:  Do you eat five or more servings of fruits and vegetables a day? Yes  Physical Activity:  On average, How many days a week do you engage in moderate to strenuous exercise: 6  On average, how many minutes per session do you engage in exercise at this level?: 130  Do you have pain that interferes with performing desired activites? (!) Yes    Level of functional ability:  Activities of daily living:  Basic: Do you need help eating, bathing, dressing, or getting around in your home?: No    Instrumental:   Do you need help using a telephone?: No   Do you need help grocery shopping?: (!) No  Can you get places out of walking distance without help? For example can you travel alone by bus, taxi, or  drive your own car?: Yes  Can you do your  own housework without help? (!) No  Do you ever have problems remembering to take any of your chronic medications? yes  Can you handle your own money without help?  Yes    Level of safety:  Home Safety:   Do you have a working smoke alarm in your home?: Yes  Does your home have loose rugs in the hallway?: No  Does your home have adequate lighting?: Yes  Does your home have grab bars in the bathroom?: Yes  Does your home have handrails on the stairs?: No  What is your living arrangement? With wife and grandson  In the past 6 months, have you experienced leaking of urine?: No  Do you have difficulty hearing? (!) Yes  Do you feel that a vision difficulty limits your personal life? No    Fall risk: no     Dementia Screening  Problems with judgment (e.g., problems making decisions, bad financial decisions, problems with thinking) No, No Change   Less interest in hobbies/activities No, No Change   Repeats the same things over and over (questions, stories, or statements) No, No Change   Trouble learning how to use a tool, appliance, or gadget (e.g., VCR, computer, microwave, remote control) No, No Change   Forgets correct month or year No, No Change   Trouble handling complicated financial affairs (e.g., balancing checkbook, income taxes, paying bills) No, No Change   Trouble remembering appointments Yes, A Change   Daily problems with thinking and/or memory No, No Change   Total AD8 Score 1   0 - 1: Normal cognition    Depression Evaluation:  During the past month, have you been bothered by feeling down, depressed or hopeless? Several days  During the past month, have you been bothered by little interest or pleasure in doing things? Several days  PHQ Score: 2    Social/Emotional Support:  Do you usually get the social and emotional support that you need?: Yes    Health Risk Assessment (HRA) reviewed in its entirety with patient face-to-face during visit. HRA Reviewed with patient. Discussed all abnormal results with  patient.     Vitals recorded in today's visit:  There were no vitals taken for this visit.  Obesity intervention(s): dietary counseling and exercise counseling.  Hearing and Vision Screen:  No exam data present    The 10-year ASCVD risk score Mikey Bussing DC Brooke Bonito., et al., 2013) is: 3.7%    Values used to calculate the score:      Age: 54 years      Sex: Male      Is Non-Hispanic African American: No      Diabetic: No      Tobacco smoker: No      Systolic Blood Pressure: 99991111 mmHg      Is BP treated: No      HDL Cholesterol: 48 mg/dL      Total Cholesterol: 171 mg/dL    Current providers and suppliers providing medical care to patient:  Patient Care Team:  [name], MD as PCP - General (Family Practice)  Clearnce Hasten, MD as Consulting Physician (Cardiology)  Amalia Greenhouse, MD as Consulting Physician (Hepatology)  Lorelle Formosa, NP as Nurse Practitioner (Hepatology)  Lovenia Kim, RN as Registered Nurse (Hepatology)  Additional external providers:     Advance directives No data recorded - talking to his wife about it, they are going to coordinate with  family and write up a living trust    POLST: discussed - full code/full care - no prolonged resuscitative efforts if not responding    A/P:    Problem List Items Addressed This Visit        [place]    GAD (generalized anxiety disorder)  Primary management per psych but will re-send Atarax for an alternative therapy to try for prn use. Continue healthy coping behaviors. Counseled on sleep hygiene.    Relevant Medications    hydrOXYzine HCL (ATARAX) 25 MG tablet      Other Visit Diagnoses     Encounter for Medicare annual wellness exam    -  Primary    Non-recurrent unilateral inguinal hernia without obstruction or gangrene        Relevant Orders    General Surgery Clinic          I have performed a comprehensive assessment of risk factors appropriate to pt's age, reviewed and reconciled all current medications and supplements, and counseled on  anticipatory guidance and risk factor reduction. Upcoming health maintenance schedule reviewed face to face and a written copy was provided on today's after visit summary.    [place] Maintenance   Topic Date Due    COVID-19 Vaccine (1) Never done    Shingles Vaccine (2 of 2) 11/17/2019    PHQ9 Depression Monitoring doc flowsheet  05/23/2020    Tetanus (2 - Td) 09/15/2026    Colorectal Cancer Screening  06/29/2028    Pneumococcal 0-64 yrs (2 of 2) 01/23/2031    Influenza  Completed    Polio Vaccine  Aged Out    HPV Vaccine <= 100 Yrs  Aged Out    Meningococcal MCV4 Vaccine  Aged Out       Follow up in 1 year for subsequent annual wellness visit.      Patient Instruction:  See Patient Education/Instruction section.      Electronically signed by:  [name], MD      ---------------------(data below generated by [name], MD)--------------------    Patient Verification & Telemedicine Consent:    I am proceeding with this evaluation at the direct request of the patient.  I have verified this is the correct patient and have obtained verbal consent and written consent from the patient/ surrogate to perform this voluntary telemedicine evaluation (including obtaining history, performing examination and reviewing data provided by the patient).   The patient/ surrogate has the right to refuse this evaluation.  I have explained risks (including potential loss of confidentiality), benefits, alternatives, and the potential need for subsequent face to face care. Patient/ surrogate understands that there is a risk of medical inaccuracies given that our recommendations will be made based on reported data (and we must therefore assume this information is accurate).  Knowing that there is a risk that this information is not reported accurately, and that the telemedicine video, audio, or data feed may be incomplete, the patient agrees to proceed with evaluation and holds [place] harmless knowing these risks.  In this evaluation, we will be providing recommendations only.  The ultimate decision to follow, or not follow, these recommendations will be left to the bedside treating/ requesting practitioner.  The patient/ surrogate has been notified that other healthcare professionals (including students, residents and Metallurgist) may be involved in this audio-video evaluation.   All laws concerning confidentiality and patient access to medical records and copies of medical records apply to telemedicine.  The  patient/ surrogate has received the [place] Notice of Privacy Practices.  I have reviewed this above verification and consent paragraph with the patient/ surrogate.  If the patient is not capacitated to understand the above, and no surrogate is available, since this is not an emergency evaluation, the visit will be rescheduled until such time that the patient can consent, or the surrogate is available to consent.    Demographics:   Medical Record #: [id number]   Date: March 14, 2020   Patient Name: Brett Palmer   DOB: [birth year]  Age: 54 year old  Sex: male  Location: Home address on file      Evaluator(s):   Brett Palmer was evaluated by me today.    Clinic Location:  [facility]  [facility]  [address]  [place] [address]    23 minutes of what became a 23 minute appointment was spent face to face with patient in coordinating care and counseling for the above issues.

## 2020-03-20 ENCOUNTER — Ambulatory Visit
Admit: 2020-03-20 | Discharge: 2020-03-20 | Disposition: A | Discharge status: Home / Self Care | Payer: Commercial Managed Care - HMO | Attending: Family Medicine | Admitting: Family Medicine

## 2020-03-20 DIAGNOSIS — R002 Palpitations: Secondary | ICD-10-CM

## 2020-03-26 ENCOUNTER — Ambulatory Visit (INDEPENDENT_AMBULATORY_CARE_PROVIDER_SITE_OTHER): Payer: Medicare (Managed Care) | Admitting: Dermatology

## 2020-03-29 ENCOUNTER — Ambulatory Visit (HOSPITAL_BASED_OUTPATIENT_CLINIC_OR_DEPARTMENT_OTHER): Payer: Commercial Managed Care - HMO | Admitting: Critical Care

## 2020-03-29 NOTE — Goals of Care (Signed)
GOALS OF CARE / ADVANCE CARE PLANNING CONVERSATION NOTE    Advance Care Planning  If not, would you like further information on Advance Directives?: Yes        Patient would be willing to endure aggressive medical therapies as long as they could still:   Regain consciousness     Who would make medical decisions for the patient if they are unable to make decisions for themselves?   Wife, Brett Palmer.    Based on above information I recommended the following:  completion of DPAHC form, completion of an advance directive and completion of a POLST    Total time spent face-to-face with patient and/or surrogate decision maker providing counseling related to advance care planning:   3 minutes    [name], MD

## 2020-04-05 ENCOUNTER — Other Ambulatory Visit: Payer: Self-pay

## 2020-04-19 ENCOUNTER — Telehealth (HOSPITAL_BASED_OUTPATIENT_CLINIC_OR_DEPARTMENT_OTHER): Payer: Self-pay | Admitting: Critical Care

## 2020-04-19 NOTE — Telephone Encounter (Signed)
Patient is scheduled for in-clinic appointment.     Appointment scheduled for 6/1     1. Do you have or have you had in the last 24 hours: :   Fever:  N   New cough (not chronic) :  N   Shortness of breath:  N  ? If yes to any of these, we will not schedule and route an encounter to department specific triage for both NEW and RET patients to assess  1. If patient has an appointment, we will cancel the appointment & route a message to the triage pool.     2. Have you knowingly been exposed to anyone having any, some or all of the symptoms listed above?   N     3. Have you traveled outside of the [place] in the last 14 days?  N     If scheduling appointment: schedule according to specific clinic guidelines.     If no to all questions, document and close encounter.  Do not route.        Did you inform patient of no visitor policy Y

## 2020-04-24 ENCOUNTER — Other Ambulatory Visit: Payer: Self-pay

## 2020-05-01 ENCOUNTER — Ambulatory Visit (HOSPITAL_BASED_OUTPATIENT_CLINIC_OR_DEPARTMENT_OTHER): Payer: Commercial Managed Care - HMO | Admitting: Surgery

## 2020-05-08 ENCOUNTER — Other Ambulatory Visit: Payer: Self-pay

## 2020-05-10 ENCOUNTER — Ambulatory Visit: Payer: No Typology Code available for payment source | Attending: Surgery | Admitting: Surgery

## 2020-05-10 ENCOUNTER — Encounter: Payer: Self-pay | Admitting: Hospital

## 2020-05-10 ENCOUNTER — Encounter (HOSPITAL_BASED_OUTPATIENT_CLINIC_OR_DEPARTMENT_OTHER): Payer: Self-pay | Admitting: Surgery

## 2020-05-10 VITALS — BP 113/72 | HR 59 | Temp 97.4°F | Resp 18 | Ht 72.0 in | Wt 205.9 lb

## 2020-05-10 DIAGNOSIS — K4020 Bilateral inguinal hernia, without obstruction or gangrene, not specified as recurrent: Secondary | ICD-10-CM

## 2020-05-10 NOTE — Progress Notes (Signed)
Surgery Clinic Note    HPI:  Brett Palmer is a 54 year old male who presents to general surgery clinic for evaluation of R inguinal hernia. He started noticing a bulge on the R side ~1 year ago near the time that he had a CT A/P for appendicitis which also showed small bilateral fat containing inguinal hernias R > L. For the last few months he has been noticing the bulge larger at the end of the day associated with some burning groin pain. He denies episodes of incarceration or strangulation or ED visits due to the pain. He denies obstructive symptoms. He denies BPH or urinary problems.     Prior surgeries include a lap appendectomy last year and a lap cholecystectomy. Of note patient does have SVTs for which he has been evaluated by Cardiology at [facility] but denies chest pain, SOB, and is able to climb a flight of stairs without any issues.     ROS  Negative other than in HPI    PMH  Past Medical History:   Diagnosis Date    Alcohol use     Gastroesophageal reflux disease     Hypertension     Migraine headache     part of gulf war syndrome    PTSD (post-traumatic stress disorder) 1995-present    [facility] by Dr[facility] Joen Laura at [facility]     SVT (supraventricular tachycardia) (CMS-HCC) 2011       [facility]  Past Surgical History:   Procedure Laterality Date    GALLBLADDER SURGERY  2011       Meds    Current Outpatient Medications:     albuterol 108 (90 Base) MCG/ACT inhaler, Inhale 2 puffs by mouth every 6 hours as needed for Wheezing., Disp: 3 Inhaler, Rfl: 3    ALPRAZolam (XANAX) 1 MG tablet, Take 1.5 pills of Xanax nightly, Disp: 45 tablet, Rfl: 0    aluminum chloride (DRYSOL) 20 % external solution, Apply to clean dry surface of areas that sweat a lot at armpits, palms, soles once daily at night, Disp: 60 mL, Rfl: 1    fluticasone (FLOVENT HFA) 110 MCG/ACT inhaler, Inhale 2 puffs by mouth 2 times daily. Rinse mouth after use, Disp: , Rfl:     hydrOXYzine HCL (ATARAX) 25 MG tablet, Take 1 tablet (25 mg) by  mouth 3 times daily as needed for Anxiety., Disp: 30 tablet, Rfl: 0    LORazepam (ATIVAN) 1 MG tablet, Take 1 tablet (1 mg) by mouth nightly., Disp: 30 tablet, Rfl: 0    omeprazole (PRILOSEC) 20 MG capsule, Take 1 capsule (20 mg) by mouth 2 times daily (before meals)., Disp: 60 capsule, Rfl: 1    Allergies  Allergies   Allergen Reactions    Compazine Anxiety     Tolerates phenergan and zofran    Latex Rash    Gabapentin Other     "crawl out of my skin"    Norco [Hydrocodone-Acetaminophen] Itching    Vicodin [Apap-Fd&C Yellow #10 [place]-Hydrocodone] Anxiety     Tolerates morphine and dilaudid       Family  No family hx bleeding or anesthesia issues    Social  Denies use of tobacco, alcohol, and illicit drugs.     O:  BP 113/72 (BP Location: Right arm, BP Patient Position: Sitting, BP cuff size: Large)    Pulse 59    Temp 97.4 F (36.3 C) (Temporal)    Resp 18    Ht 6' (1.829 m)  Wt 93.4 kg (205 lb 14.4 oz)    SpO2 99%    BMI 27.93 kg/m     Exam  General: Alert, no distress, pleasant affect, cooperative.  Heart: Normal rate and regular rhythm  Lungs: Unlabored breathing on room air  Abdomen: Soft, non-tender, non-distended, no rebound or guarding. R inguinal lemon sized bulge, soft, reducible; small L inguinal hernia   Extremities: No cyanosis, clubbing, or edema. Peripheral pulses 2+ throughout, full range of motion  Neuro: A&Ox4, motor/sensory grossly intact x 4 extremities.    A/P:  Brett Palmer is a 54 year old male who presents to general surgery clinic for evaluation of R inguinal hernia with imaging and exam findings of small L sided hernia as well. Will plan for bilateral laparoscopic inguinal hernia repair with possible mesh.   - OR case request, consent obtained in clinic  - will need Anesthesia clearance prior to OR given history of SVTs followed by Cardiology      Patient seen and discussed with Dr. Rip Harbour, MD  General Surgery, PGY2

## 2020-05-10 NOTE — Progress Notes (Signed)
Attending Surgeon clinic note     This is a 54 year old man known to me from a previous surgery for cholecystitis, he returns with a complaint of a mass at his right groin. This has been present for over a year. He knows it single larger with time and causes some discomfort sometimes. He is interested in repair of his inguinal hernia.     On examination he has an obvious bulge above the right scrotum which is consistent with an inguinal hernia. On examination he also has a small hernia on the left side which is well within the inguinal canal. Also he had a CT scan from about May of 2020 which shows bilateral inguinal hernias.     We explained the risks and benefits of laparoscopic bilateral inguinal hernia repair to the patient including the risks of infection, bleeding, recurrence and we also explained the use of mesh.     Patient indicated his questions have been answered and provided a written consent. He does have a past history of cardiac ablation for SVT and we will see him with anesthesia before the surgery.     We have booked him for same-day surgery in July.

## 2020-05-11 ENCOUNTER — Other Ambulatory Visit (HOSPITAL_BASED_OUTPATIENT_CLINIC_OR_DEPARTMENT_OTHER): Payer: Self-pay | Admitting: Surgery

## 2020-05-11 ENCOUNTER — Telehealth (HOSPITAL_BASED_OUTPATIENT_CLINIC_OR_DEPARTMENT_OTHER): Payer: Self-pay | Admitting: Surgery

## 2020-05-11 ENCOUNTER — Encounter (HOSPITAL_BASED_OUTPATIENT_CLINIC_OR_DEPARTMENT_OTHER): Payer: Self-pay | Admitting: Hospital

## 2020-05-11 LAB — EMMI,  PAIN MANAGEMENT: ACUTE IN HOSPITAL: EMMI Video Order Number: 16138950676

## 2020-05-11 LAB — EMMI , ANESTHESIA (ADULT): EMMI Video Order Number: 10403659237

## 2020-05-11 NOTE — Telephone Encounter (Signed)
Attempted to reach patient - unable to reach  Email sent about upcoming sx

## 2020-05-12 LAB — EMMI , INGUINAL HERNIA: EMMI Video Order Number: 14631273365

## 2020-05-12 LAB — EMMI , PATIENT SATISFACTION: HOSPITAL DISCHARGE EXPECTATIONS: EMMI Video Order Number: 15242379103

## 2020-05-20 ENCOUNTER — Encounter (HOSPITAL_COMMUNITY): Payer: Self-pay

## 2020-05-20 ENCOUNTER — Emergency Department
Admission: EM | Admit: 2020-05-20 | Discharge: 2020-05-20 | Disposition: A | Discharge status: Home / Self Care | Payer: No Typology Code available for payment source | Attending: Emergency Medicine | Admitting: Emergency Medicine

## 2020-05-20 ENCOUNTER — Emergency Department (HOSPITAL_BASED_OUTPATIENT_CLINIC_OR_DEPARTMENT_OTHER): Payer: No Typology Code available for payment source

## 2020-05-20 DIAGNOSIS — Z87891 Personal history of nicotine dependence: Secondary | ICD-10-CM | POA: Insufficient documentation

## 2020-05-20 DIAGNOSIS — F419 Anxiety disorder, unspecified: Secondary | ICD-10-CM | POA: Insufficient documentation

## 2020-05-20 DIAGNOSIS — K219 Gastro-esophageal reflux disease without esophagitis: Secondary | ICD-10-CM | POA: Insufficient documentation

## 2020-05-20 DIAGNOSIS — Z79899 Other long term (current) drug therapy: Secondary | ICD-10-CM | POA: Insufficient documentation

## 2020-05-20 DIAGNOSIS — G4700 Insomnia, unspecified: Secondary | ICD-10-CM | POA: Insufficient documentation

## 2020-05-20 DIAGNOSIS — G43809 Other migraine, not intractable, without status migrainosus: Secondary | ICD-10-CM | POA: Insufficient documentation

## 2020-05-20 DIAGNOSIS — R1310 Dysphagia, unspecified: Secondary | ICD-10-CM | POA: Insufficient documentation

## 2020-05-20 DIAGNOSIS — R002 Palpitations: Secondary | ICD-10-CM

## 2020-05-20 DIAGNOSIS — R079 Chest pain, unspecified: Secondary | ICD-10-CM

## 2020-05-20 DIAGNOSIS — F4310 Post-traumatic stress disorder, unspecified: Secondary | ICD-10-CM | POA: Insufficient documentation

## 2020-05-20 DIAGNOSIS — I10 Essential (primary) hypertension: Secondary | ICD-10-CM | POA: Insufficient documentation

## 2020-05-20 DIAGNOSIS — Z808 Family history of malignant neoplasm of other organs or systems: Secondary | ICD-10-CM | POA: Insufficient documentation

## 2020-05-20 DIAGNOSIS — Z7951 Long term (current) use of inhaled steroids: Secondary | ICD-10-CM | POA: Insufficient documentation

## 2020-05-20 DIAGNOSIS — Z9049 Acquired absence of other specified parts of digestive tract: Secondary | ICD-10-CM | POA: Insufficient documentation

## 2020-05-20 DIAGNOSIS — R0789 Other chest pain: Secondary | ICD-10-CM | POA: Insufficient documentation

## 2020-05-20 DIAGNOSIS — Z885 Allergy status to narcotic agent status: Secondary | ICD-10-CM | POA: Insufficient documentation

## 2020-05-20 DIAGNOSIS — Z888 Allergy status to other drugs, medicaments and biological substances status: Secondary | ICD-10-CM | POA: Insufficient documentation

## 2020-05-20 DIAGNOSIS — Z8249 Family history of ischemic heart disease and other diseases of the circulatory system: Secondary | ICD-10-CM | POA: Insufficient documentation

## 2020-05-20 LAB — CBC WITH DIFF, BLOOD
ANC-Automated: 2.9 10*3/uL (ref 1.6–7.0)
Abs Basophils: 0 10*3/uL
Abs Eosinophils: 0.1 10*3/uL (ref 0.0–0.5)
Abs Lymphs: 2 10*3/uL (ref 0.8–3.1)
Abs Monos: 0.2 10*3/uL (ref 0.2–0.8)
Basophils: 0 %
Eosinophils: 2 %
Hct: 43.1 % (ref 40.0–50.0)
Hgb: 14.8 gm/dL (ref 13.7–17.5)
Lymphocytes: 38 %
MCH: 29.1 pg (ref 26.0–32.0)
MCHC: 34.3 g/dL (ref 32.0–36.0)
MCV: 84.8 um3 (ref 79.0–95.0)
MPV: 9.2 fL — ABNORMAL LOW (ref 9.4–12.4)
Monocytes: 5 %
Plt Count: 158 10*3/uL (ref 140–370)
RBC: 5.08 10*6/uL (ref 4.60–6.10)
RDW: 13.7 % (ref 12.0–14.0)
Segs: 55 %
WBC: 5.4 10*3/uL (ref 4.0–10.0)

## 2020-05-20 LAB — COMPREHENSIVE METABOLIC PANEL, BLOOD
ALT (SGPT): 17 U/L (ref 0–41)
AST (SGOT): 28 U/L (ref 0–40)
Albumin: 4.6 g/dL (ref 3.5–5.2)
Alkaline Phos: 85 U/L (ref 40–129)
Anion Gap: 14 mmol/L (ref 7–15)
BUN: 8 mg/dL (ref 6–20)
Bicarbonate: 24 mmol/L (ref 22–29)
Bilirubin, Tot: 0.64 mg/dL (ref <1.2)
Calcium: 9.6 mg/dL (ref 8.5–10.6)
Chloride: 100 mmol/L (ref 98–107)
Creatinine: 0.71 mg/dL (ref 0.67–1.17)
GFR: >60 mL/min
Glucose: 103 mg/dL — ABNORMAL HIGH (ref 70–99)
Potassium: 4.3 mmol/L (ref 3.5–5.1)
Sodium: 138 mmol/L (ref 136–145)
Total Protein: 7.6 g/dL (ref 6.0–8.0)

## 2020-05-20 LAB — LIPASE, BLOOD: Lipase: 28 U/L (ref 13–60)

## 2020-05-20 LAB — ECG 12-LEAD
ATRIAL RATE: 76 {beats}/min
ECG INTERPRETATION: NORMAL
P AXIS: 39 degrees
PR INTERVAL: 162 ms
QRS INTERVAL/DURATION: 96 ms
QT: 382 ms
QTc (Bazett): 429 ms
R AXIS: 89 degrees
T AXIS: 45 degrees
VENTRICULAR RATE: 76 {beats}/min

## 2020-05-20 LAB — TSH, BLOOD: TSH: 2 u[IU]/mL (ref 0.27–4.20)

## 2020-05-20 LAB — TROPONIN T GEN 5 W/REFLEX TO CK/CKMB
Troponin T Gen 5 w/Reflex CK/CKMB: <6 ng/L (ref <22)
Troponin T Gen 5: <6 ng/L (ref <22)

## 2020-05-20 LAB — PROTHROMBIN TIME, BLOOD
INR: 1.1
PT,Patient: 12.1 s (ref 9.7–12.5)

## 2020-05-20 MED ORDER — HYDROXYZINE HCL 25 MG OR TABS
50.0000 mg | ORAL_TABLET | Freq: Once | ORAL | Status: DC
Start: 2020-05-20 — End: 2020-05-20

## 2020-05-20 MED ORDER — LORAZEPAM 1 MG OR TABS
1.0000 mg | ORAL_TABLET | Freq: Once | ORAL | Status: AC
Start: 2020-05-20 — End: 2020-05-20
  Administered 2020-05-20 (×2): 1 mg via ORAL
  Filled 2020-05-20: qty 1

## 2020-05-20 MED ORDER — TRAZODONE HCL 150 MG OR TABS
150.0000 mg | ORAL_TABLET | Freq: Every evening | ORAL | 0 refills | Status: DC
Start: 2020-05-20 — End: 2020-05-29

## 2020-05-20 NOTE — ED Notes (Signed)
Discharge instructions given to patient. Patient states understanding. Bradford wristband removed. Patient ambulates with steady gait. A&OX4. All belongings with patient.

## 2020-05-20 NOTE — ED Notes (Signed)
05/20/2020 8:47 AM Brett Palmer    An EKG was handed to Dr. Leonides Schanz

## 2020-05-20 NOTE — ED Provider Notes (Signed)
Emergency Department Note   City electronic medical record reviewed for pertinent medical history.     Nursing Triage Note:   Chief Complaint   Patient presents with    Chest Pain - Adult     States that he woke up this morning with hiccups and pain to the shoulder LEFT side and chest pressure.        HPI:   54 year old male with a PMH significant for SVT s/p ablation, anxiety, esophageal candidiasis, presenting to the emergency department with chest pain.  Patient gets episodes of chest pain that feels like chest pressure that radiates to his left shoulder that feels like an ache.  Patient had another episode this morning that was preceded by hiccups.  The patient normally has hiccups before these episodes.  When the patient has these episodes of chest pressure his heart rate goes up between 130 and 160, his blood pressure goes up with a systolic of about 025, and the patient becomes diaphoretic.  Patient also has been having difficulty swallowing solids however the patient is able to tolerate p.o..  Patient has been feeling very anxious and has been having difficulty sleeping.  Denies shortness of breath, nausea, vomiting, abdominal pain.    HPI    Past Medical History:   Diagnosis Date    Alcohol use     Gastroesophageal reflux disease     Hypertension     Migraine headache     part of gulf war syndrome    PTSD (post-traumatic stress disorder) 1995-present    [facility] by [facility]. Joen Laura at [facility]     SVT (supraventricular tachycardia) (CMS-HCC) 2011       Past Surgical History:   Procedure Laterality Date    GALLBLADDER SURGERY  2011       Family History:    Family History   Problem Relation Name Age of Onset    Cancer Father          melanoma, died at age 27    Heart Disease Mother          CHF, thought to have passed from MI    Heart Disease Maternal Aunt          CHF       Social History:    Social History     Tobacco Use    Smoking status: Former Smoker     Packs/day: 0.50     Years: 10.00     Pack years:  5.00     Types: Cigarettes     Quit date: 01/11/1990     Years since quitting: 30.3    Smokeless tobacco: Never Used   Substance Use Topics    Alcohol use: Yes     Comment: down to 1/2-3/4 bottle of wine    Drug use: No     Comment: Denies MJ use       Alcohol Use:     Frequency of Alcohol Consumption:     Average Number of Drinks:     Frequency of Binge Drinking:        Medications:   Prior to Admission Medications   Prescriptions Last Dose Informant Patient Reported? Taking?   ALPRAZolam (XANAX) 1 MG tablet   Yes No   Sig: Take 1.5 pills of Xanax nightly   LORazepam (ATIVAN) 1 MG tablet   Yes No   Sig: Take 1 tablet (1 mg) by mouth nightly.   albuterol 108 (90 Base) MCG/ACT inhaler   No  No   Sig: Inhale 2 puffs by mouth every 6 hours as needed for Wheezing.   aluminum chloride (DRYSOL) 20 % external solution   No No   Sig: Apply to clean dry surface of areas that sweat a lot at armpits, palms, soles once daily at night   fluticasone (FLOVENT HFA) 110 MCG/ACT inhaler   Yes No   Sig: Inhale 2 puffs by mouth 2 times daily. Rinse mouth after use   hydrOXYzine HCL (ATARAX) 25 MG tablet   No No   Sig: Take 1 tablet (25 mg) by mouth 3 times daily as needed for Anxiety.   omeprazole (PRILOSEC) 20 MG capsule   No No   Sig: Take 1 capsule (20 mg) by mouth 2 times daily (before meals).      Facility-Administered Medications: None       Allergies: Compazine, Latex, Gabapentin, Norco [hydrocodone-acetaminophen], and Vicodin [apap-fd&c yellow #10 [place]-hydrocodone]    Review of Systems:   Review of Systems   Constitutional: Negative for fever.   HENT: Positive for trouble swallowing.    Eyes: Negative for redness.   Respiratory: Negative for shortness of breath.    Cardiovascular: Positive for chest pain and palpitations.   Neurological: Negative for speech difficulty.   Psychiatric/Behavioral: Positive for sleep disturbance.   All other systems reviewed and are negative.    All other systems reviewed and negative unless  otherwise noted in the HPI or above. This was done per my custom and practice for systems appropriate to the chief complaint in an emergency department setting and varies depending on the quality of history that the patient is able to provide.      Physical Exam:   05/20/20  0835   BP: 118/78   Pulse: 90   Resp: 16   Temp: 97.7 F (36.5 C)   SpO2: 98%     Nursing note and vitals reviewed.     Physical Exam  Vitals and nursing note reviewed.   Constitutional:       General: He is not in acute distress.     Appearance: He is well-developed. He is not ill-appearing, toxic-appearing or diaphoretic.   HENT:      Head: Normocephalic.      Nose: Nose normal.      Mouth/Throat:      Pharynx: Oropharynx is clear. No oropharyngeal exudate or posterior oropharyngeal erythema.   Eyes:      Conjunctiva/sclera: Conjunctivae normal.   Cardiovascular:      Rate and Rhythm: Normal rate and regular rhythm.      Heart sounds: Normal heart sounds.   Pulmonary:      Effort: Pulmonary effort is normal. No tachypnea or respiratory distress.      Breath sounds: Normal breath sounds. No stridor. No decreased breath sounds, wheezing, rhonchi or rales.   Abdominal:      General: Abdomen is flat.      Palpations: Abdomen is soft.   Musculoskeletal:         General: Normal range of motion.      Cervical back: No rigidity.      Right lower leg: No edema.      Left lower leg: No edema.   Skin:     General: Skin is warm and dry.   Neurological:      Mental Status: He is alert.      Gait: Gait normal.   Psychiatric:         Mood and  Affect: Mood is anxious.         Workup Review:     Labs Reviewed   CBC WITH DIFF, BLOOD - Abnormal; Notable for the following components:       Result Value    MPV 9.2 (*)     All other components within normal limits   COMPREHENSIVE METABOLIC PANEL, BLOOD - Abnormal; Notable for the following components:    Glucose 103 (*)     All other components within normal limits   TROPONIN T GEN 5 W/REFLEX TO CK/CKMB   TROPONIN  T GEN 5 W/REFLEX TO CK/CKMB   PROTHROMBIN TIME, BLOOD   TSH, BLOOD   LIPASE, BLOOD   TSH, BLOOD   LIPASE, BLOOD   METANEPHRINES, PLASMA     X-Ray Chest Frontal And Lateral   Final Result   IMPRESSION:   No acute cardiopulmonary findings.        Medications   hydrOXYzine HCL (ATARAX) tablet 50 mg (50 mg Oral Not given 05/20/20 1225)   LORazepam (ATIVAN) tablet 1 mg (1 mg Oral Given 05/20/20 1215)         Impression & Initial ED Plan:  54 year old  male presents with chest pain.  The chest pain is accompanied by diaphoresis and flushing.  Differential diagnosis includes but is not limited to ACS, hyperthyroidism, pheochromocytoma, arrhythmia.    Troponin and repeat troponin negative, making ACS unlikely.  TSH within normal limits, making hypothyroidism unlikely.  Plasma metanephrines pending to evaluate for pheochromocytoma.  No arrhythmia noted on EKG or cardiac monitoring.  Patient was offered to have Holter monitoring ordered in the emergency department however the patient stated that he would get the monitor or from his primary care physician.    Patient requested medication for insomnia.  Patient stated that he has tried hydroxyzine, Ativan, alprazolam, Benadryl, melatonin without success.  Patient is asking for another medication to help him sleep.  Patient was going to get new Lunesta prescribed to him however he had to have his liver function checked prior to this being prescribed by his primary care physician.    Patient discharged stable condition with return precautions.  Patient was given prescription for 14 days of trazodone, 150 mg p.o. q.h.s..  Patient also given a referral to to Gastroenterology.    I have discussed my evaluation and care plan for the patient with the attending physician Dr. Ulice Brilliant.     Judy Pimple, MD  Resident  05/20/20 3794       Delton Coombes, MD  05/22/20 1010

## 2020-05-20 NOTE — ED EKG Interpretation (Signed)
ED EKG Interpretation    EKG: Normal Sinus Rhythm with Normal Axis and no ischemic changes.

## 2020-05-20 NOTE — ED Notes (Signed)
Dr Lynder Parents notified regarding patient's plan. Awaiting discharge orders

## 2020-05-23 ENCOUNTER — Encounter (INDEPENDENT_AMBULATORY_CARE_PROVIDER_SITE_OTHER): Payer: Self-pay | Admitting: Family Medicine

## 2020-05-23 NOTE — Telephone Encounter (Signed)
Please assist in making patient a follow up appt with PCP at next available. Also, please inform patient that Dr. Salvadore Dom will be back Friday, and can speak with him since she is aware of his history    Brett Mason, FNP-BC  (covering)

## 2020-05-23 NOTE — Telephone Encounter (Signed)
From: [name]  To: [name], MD  Sent: 05/23/2020 9:14 AM PDT  Subject: 20-Other    I was in the ER Sunday. It is in my record. I had chest pain, body aches, and felt horrible. I explained to them it was probably due to the fact that I have had insomnia for the last couple of months. My restless leg syndrome makes it incredibly difficult to maintain sleep and I average about 2 hours of sleep before I wake with leg pain, burning sensation in my feet and hiccups. I will say it was the worst experience I have ever had at a hospital. My wife was appalled at their lack of care. I am supposed to follow up with you as soon as possible.    They gave me trazodone to allow me to sleep. It makes me very dizzy to the point of nausea, it has caused weight gain in the short amount of time I've taken it, it causes water retention, and does nothing for my leg pain. It also makes me very unsteady even after I am up and awake. I take it around 8pm and still can't walk a straight line at 10 am in the morning. This makes it unsafe for me to move around much less drive.     But I have been able to get a few more hours of sleep the past few nights. All I really want is something to help me sleep that won't make me nauseous, and something to relieve the pain in my legs to prevent me from waking constantly. I have developed blisters on my heels from the involuntary leg movements I have at night. The leg pain was bad enough but now I have the blisters to add to my misery.    Do you have any recommendations? I know you are very busy, but you also know me and my health history.     Brett Palmer

## 2020-05-24 ENCOUNTER — Telehealth (INDEPENDENT_AMBULATORY_CARE_PROVIDER_SITE_OTHER): Payer: Self-pay | Admitting: Gastroenterology

## 2020-05-24 LAB — METANEPHRINES, PLASMA
Metanephrine: 0.31 nmol/L (ref 0.00–0.49)
Normetanephrine: 0.56 nmol/L (ref 0.00–0.89)

## 2020-05-24 NOTE — Telephone Encounter (Addendum)
[name] (Wife) called in and stated that she would like to schedule an appointment for the patient. I have scheduled the patient on 08/29/2020 with Dr. Laurence Ferrari and the referral states "URGENT" 1-2wks and patient has been placed on the wait list.     Thank you

## 2020-05-25 NOTE — Telephone Encounter (Addendum)
opened in error.

## 2020-05-25 NOTE — Telephone Encounter (Signed)
Scheduling team:    Since referral is urgent, please check if another gen gi provider has a sooner appointment. Pt can change providers once.  Thank you

## 2020-05-25 NOTE — Telephone Encounter (Addendum)
Unfortunately due to protocol we are unable to schedule patients with different providers for sooner appts. Pt has been placed on wait list. Please review and advise if appropriate or if Dr. Laurence Ferrari would like to overbook pt.    If pt calls, please inform pt he is on waitlist.

## 2020-05-28 ENCOUNTER — Telehealth (INDEPENDENT_AMBULATORY_CARE_PROVIDER_SITE_OTHER): Payer: Commercial Managed Care - HMO

## 2020-05-29 ENCOUNTER — Ambulatory Visit: Payer: No Typology Code available for payment source | Admitting: General Surgery

## 2020-05-29 ENCOUNTER — Encounter (HOSPITAL_BASED_OUTPATIENT_CLINIC_OR_DEPARTMENT_OTHER): Payer: Self-pay | Admitting: General Surgery

## 2020-05-29 DIAGNOSIS — Z01818 Encounter for other preprocedural examination: Secondary | ICD-10-CM

## 2020-05-29 NOTE — Telephone Encounter (Signed)
Was referred by ED where he was seen for variety of complaints "chest pain, diaphoresis, flushing, hiccups, left shoulder pain, anxiety, difficulty sleeping, difficulty swallowing solids but able to tolerate po".    Patient messaged his pcp after ED visit, mainly complaining about insomnia and restless legs.    Patient should see his pcp first and if Dr.Celebi thinks he needs GI evaluation, we can then see him.    His above described symptoms are not s/o candida esophagitis. Pcp can also see if any oral thrush and can try treating that first if positive.    Thanks.

## 2020-05-29 NOTE — Anesthesia Preprocedure Evaluation (Addendum)
ANESTHESIA PRE-OPERATIVE EVALUATION    Patient Information    Name: Brett Palmer    MRN: [id number]    DOB: 11-24-1966    Age: 54 year old    Sex: male  Procedure(s):  REPAIR, HERNIA, BILATERAL INGUINAL, LAPAROSCOPIC      Pre-op Vitals:   There were no vitals taken for this visit.        Primary language spoken:  English    ROS/Medical History:      History of Present Illness: 54 year old man known to me from a previous surgery for cholecystitis, he returns with a complaint of a mass at his right groin. This has been present for over a year. He knows it single larger with time and causes some discomfort    -s/f REPAIR, HERNIA, BILATERAL INGUINAL, LAPAROSCOPIC 06/08/20    General:  has fallen in the last 6 months,  Weight 200    > 64mts walks daily; 6 miles easily     Cardiovascular:  hypertension, well controlled,  dysrhythmias (svt- ablation 2016 ),    05/2020  Normal sinus rhythm with sinus arrhythmia   Normal ECG     03/2020  Stress: All segments are normal.  EKG:  Resting EKG showed normal sinus rhythm.  There are non specific ST T wave changes seen during peak stress period.  Summary:   1. Negative stress echo for ischemia.   2. Average exercise capacity for the patients age.   3. Normal blood pressure response to exercise.    03/2020  EF64%  Summary:   1. The left ventricular size is normal. The left ventricular systolic function is normal.   2. The right ventricular size is normal and systolic function is normal.   3. Normal pulmonary artery pressure with right ventricular systolic pressure measuring 26 mmHg.   4. No significant valvular abnormalities.   5. Compared to prior study no significant change.      + recent ED visit- attributed to GI sx's and anxiety- testing negative     Anesthesia History:  Phone anes eval  Needs exam  ( [facility]       Pulmonary:   negative pulmonary ROS  sleep apnea (no cpap x 2 yrs since 80lb loss ),     Neuro/Psych:      psychiatric history (anxiety),  + PTSD  + [place]  + balance issue/nausea    Chronic benzo weaning ( xanax 3 mg AND lorazpam 1 mg @ HS , prn lorazepam during day  x many years) previously on 8 mg Xanax  Hematology/Oncology:   hematologic/lymphatic negative      GI/Hepatic:  GERD, well controlled,   Infectious Disease:  negative for infectious disease     Renal:  negative renal ROS   Endocrine/Other:  negative endo/other ROS     Pregnancy History:   Pediatrics:         Pre Anest[facility] (PCC/CPC) notes/[facility]                      Phone anes eval: Pre op instructions reviewed and emailed               Physical Exam    Airway:    Inter-inciser distance > 4 cm  Prognanth Able    Mallampati: II  Neck ROM: full  TM distance: 5-6 cm  Short  thick neck: No          Cardiovascular:  - cardiovascular exam normal         Pulmonary:  - pulmonary exam normal           Neuro/Neck/Skeletal/Skin:      Dental:  - normal exam    Abdominal:      General: normal weight     Additional Clinical Notes:               Last  OSA (STOP BANG) Score:  No data recorded    Last OSA  (STOP) Score for   No data recorded      Has a physician diagnosed you with sleep apnea?: Yes  Do you use a CPAP at home?: No  OSA total score (A score of 2 or more is high risk. Offer patient sleep study.): 0    Past Medical History:   Diagnosis Date    Alcohol use     Gastroesophageal reflux disease     Hypertension     Migraine headache     part of gulf war syndrome    PTSD (post-traumatic stress disorder) 1995-present    treated by Dr. Joen Laura at [facility]     SVT (supraventricular tachycardia) (CMS-HCC) 2011     Past Surgical History:   Procedure Laterality Date    GALLBLADDER SURGERY  2011    CARDIAC ELECTROPHYSIOLOGY STUDY AND ABLATION       Social History     Tobacco Use    Smoking status: Former Smoker     Packs/day: 0.50     Years: 10.00     Pack years: 5.00     Types: Cigarettes     Quit date:  01/11/1990     Years since quitting: 30.4    Smokeless tobacco: Never Used   Substance Use Topics    Alcohol use: Yes     Comment: down to 1/2-3/4 bottle of wine    Drug use: No     Comment: Denies MJ use        Alcohol Use:     Frequency of Alcohol Consumption:     Average Number of Drinks:     Frequency of Binge Drinking:        Current Outpatient Medications   Medication Sig Dispense Refill    albuterol 108 (90 Base) MCG/ACT inhaler Inhale 2 puffs by mouth every 6 hours as needed for Wheezing. 3 Inhaler 3    ALPRAZolam (XANAX) 1 MG tablet 3 mg nightly. Take 1.5 pills of Xanax nightly 45 tablet 0    aluminum chloride (DRYSOL) 20 % external solution Apply to clean dry surface of areas that sweat a lot at armpits, palms, soles once daily at night 60 mL 1    fluticasone (FLOVENT HFA) 110 MCG/ACT inhaler Inhale 2 puffs by mouth 2 times daily. Rinse mouth after use      hydrOXYzine HCL (ATARAX) 25 MG tablet Take 1 tablet (25 mg) by mouth 3 times daily as needed for Anxiety. 30 tablet 0    LORazepam (ATIVAN) 1 MG tablet Take 1 tablet (1 mg) by mouth nightly. 30 tablet 0    omeprazole (PRILOSEC) 20 MG capsule Take 1 capsule (20 mg) by mouth 2 times daily (before meals). 60 capsule 1     No current facility-administered medications for this visit.     Allergies   Allergen Reactions    Compazine Anxiety     Tolerates phenergan and zofran  Latex Rash    Atarax [Hydroxyzine] Other     Bad reaction to med    Gabapentin Other     "crawl out of my skin"    Norco [Hydrocodone-Acetaminophen] Itching    Vicodin [Apap-Fd&C Yellow #10 [place]-Hydrocodone] Anxiety     Tolerates morphine and dilaudid       Labs and Other Data  Lab Results   Component Value Date    NA 138 05/20/2020    K 4.3 05/20/2020    CL 100 05/20/2020    BICARB 24 05/20/2020    BUN 8 05/20/2020    CREAT 0.71 05/20/2020    GLU 103 (H) 05/20/2020    Weaver 9.6 05/20/2020     Lab Results   Component Value Date    AST 28 05/20/2020    ALT 17 05/20/2020     GGT 452 (H) 07/21/2018    ALK 85 05/20/2020    TP 7.6 05/20/2020    ALB 4.6 05/20/2020    TBILI 0.64 05/20/2020    DBILI 0.6 (H) 04/06/2019     Lab Results   Component Value Date    WBC 5.4 05/20/2020    RBC 5.08 05/20/2020    HGB 14.8 05/20/2020    HCT 43.1 05/20/2020    MCV 84.8 05/20/2020    MCHC 34.3 05/20/2020    RDW 13.7 05/20/2020    PLT 158 05/20/2020    MPV 9.2 (L) 05/20/2020    SEG 55 05/20/2020    LYMPHS 38 05/20/2020    MONOS 5 05/20/2020    EOS 2 05/20/2020    BASOS 0 05/20/2020     Lab Results   Component Value Date    INR 1.1 05/20/2020    PTT 35 (H) 04/06/2019     No results found for: ARTPH, ARTPO2, ARTPCO2    Anesthesia Plan:  Risks and Benefits of Anesthesia  I have personally performed an appropriate pre-anesthesia physical exam of the patient (including heart, lungs, and airway) prior to the anesthetic and reviewed the pertinent medical history, drug and allergy history, laboratory and imaging studies and consultations.   I have determined that the patient has had adequate assessment and testing.  I have validated the documentation of these elements of the patient exam and/or have made necessary changes to reflect my own observations during my pre-anesthesia exam.  Anesthetic techniques, invasive monitors, anesthetic drugs for induction, maintenance and post-operative analgesia, risks and alternatives have been explained to the patient and/or patient's representatives.    I have prescribed the anesthetic plan:         Planned anesthesia method: General         ASA 3 (Severe systemic disease)     Potential anesthesia problems identified and risks including but not limited to the following were discussed with patient and/or patient's representative: Adverse or allergic drug reaction, Dental injury or sore throat and Injury to brain, heart and other organs    No Beta Blocker Indicated: Patient not on beta blockers and Does not meet criteria    Planned monitoring method: Routine  monitoring    Informed Consent:  Anesthetic plan and risks discussed with Patient.    Plan discussed with Resident and Attending.

## 2020-05-29 NOTE — Patient Instructions (Signed)
PREOPERATIVE SURGICAL INFORMATION     Your surgery is currently scheduled at [facility] on 06/08/20  The scheduler will be contacting you with the check in time                                                                         [facility], [place], [place], [place], [place] [address]     Please check in at [employer], [place], 1st floor.     PARKING    For [employer]:   Arbor [employer] manager (at the end of [address]) or in [phone] (at [employer] and [place]: [place] parking is available between 8 a.m. and 4:30 p.m. weekdays at the main entrance to the [employer] on [address]. The cost is the same as self parking. With a handicapped placard, [place] parking is free.   [email or web address]       QUESTIONS    If you have any questions between now and the day of your surgery, please do not hesitate to call:      [facility]: [phone]     DAY OF SURGERY ARRIVAL TIME:    On the day of your Surgery/Procedure, please arrive at the time provided by the surgery/preop team. If you have any questions regarding your arrival time, please call:      Preoperative Surgical Admissions at [facility]: [phone]        MEDICATION INSTRUCTIONS:       MEDICATION INSTRUCTIONS BEFORE SURGERY/PROCEDURE:       OK to take the following medications as scheduled with a small sip of water on the morning of surgery:      PLEASE HOLD ALL NSAIDS (non-steroidal anti-inflammatory drugs) SUCH AS advil, aleve, motrin, ibuprofen, relafen, lodine, feldene, Diclofenac, voltaren, indomethacin, naproxen, celebrex, Mobic 7 days before surgery.        Please hold vitamins, supplements, herbs & fish oil 7 days before surgery.      It is OK to take acetaminophen (Tylenol) for pain around the time of surgery unless you have liver disease.       AFTER YOUR VISIT WITH [place],  IF YOU START TAKING A NEW MEDICATION BEFORE SURGERY, PLEASE CALL us TO MAKE SURE IT IS SAFE TO TAKE & WILL NOT AFFECT YOUR SURGERY.         OSA INSTRUCTIONS:      KOP/HC/JMC: If you use a CPAP machine, please bring the entire machine, including mask and tubing, with you on the day of surgery.          EATING/DRINKING      DO NOT EAT OR DRINK ANYTHING AFTER MIDNIGHT ON THE DAY OF SURGERY    Preparing for your Surgery:      Please wear clean loose-fitting clothes and leave valuables at home    Do not shave or remove body hair. Facial shaving is permitted. If you are having head surgery, ask your doctor whether you can shave.   Bring a picture ID and your insurance card, and be prepared to pay your deductible or co-insurance by cash, check, or credit card when you  arrive.    If you are going home after your surgery, please make sure to arrange for an adult to drive you home. You CANNOT use UBER or LYFT. If you do not have a ride, your surgery may be cancelled.       On The Day of Your Surgery:       Check in at the location mentioned above    If you are a woman of child bearing age, please note that you may be asked to give a urine sample upon check-in   You will meet your anesthesia and surgery teams in the preoperative holding area before surgery.    Once surgery is over, you will wake up in the recovery room.   If you go home, an adult chaperone will need to stay with you for the first 24 hours after surgery.    Visitor policy during the GBTDV-76 pandemic is subject to change. Current visitor policy can be found at [email or web address]      A video about what to expect for the day of surgery can be found here:    [email or web address]  Or by searching You-tube for [place] before surgery and [place] after surgery     You medical records are available to you at [email or web address] click sign up now.        Preparing for your surgery    Shower with  Chlorhexidine (CHG) soap to prevent infection    Instructions:   You should shower with CHG soap a minimum of three times before your surgery, or more often as directed by your surgeon.    Showering several times before surgery blocks germ growth and provides the best protection when used at least 3 times in a row.                      3 =                    +                  +               At least 3 showers       the morning the night the morning of   before surgery before surgery before surgery admission to surgery               Date:_______       Date:_______            Date:_______    How to shower with CHG Soap:  1. Rinse your body with warm water.  2. Wash your hair with regular shampoo. Rinse your hair with water. If you are having neck surgery, use CHG soap instead of your regular shampoo to wash your hair. Rinse your hair with water.  3. Wet a clean sponge. Turn off the water. Apply CHG liberally.  4. Firmly massage all areas: neck, arms, chest, back, abdomen, hips, groin, genitals (external only) and buttocks. Clean your legs and feet and between your fingers and toes. Pay special attention to the site of your surgery and all surrounding skin. Ask for help to clean your back if you have a spinal surgery.   5. Lather again before rinsing.  6. Turn on the water and rinse CHG off your body.  7. Dry off with a clean towel.  8. Dont apply lotions or powders.   9. Use clean clothes and freshly laundered bed  linens.          Repeat steps 1- 9 each time you shower.      Caution: When using CHG soap, avoid contact with eyes, nose, ear canals and mouth.      Important reminders:   Do not use any other soaps or body wash when using CHG. Other soaps can block the CHG benefits.   After showering, do not apply lotion, cream, powder, deodorant, or hair conditioner.   Do not shave or remove body hair. Facial shaving is permitted. If you are having head surgery, ask your doctor whether you can shave.   CHG is safe  to use on minor wounds, rashes, burns, and over staples and stiches.   Allergic reactions are rare but may occur. If you have an allergic reaction, stop using CHG and call your doctor if you have a skin irritation.   If you are allergic to CHG, please follow the bathing instructions above using an over-the-counter regular soap instead of CHG.                            How to Use Chlorhexidine Before Surgery  Chlorhexidine gluconate (CHG) is a germ-killing (antiseptic) solution that is used to clean the skin. It gets rid of the bacteria that normally live on the skin. Cleaning your skin with CHG before surgery helps lower the risk for infection after surgery. To clean your skin before surgery, you may be given:   A CHG solution to use in the shower.   A prepackaged cloth that contains CHG.  What are the risks?  Risks of using CHG include:   A skin reaction.   Hearing loss, if CHG gets in your ears.   Eye injury, if CHG gets in your eyes and is not rinsed out.   The CHG product catching fire.  Make sure that you avoid smoking and flames after applying CHG to your skin.  Do not use CHG:   If you have a chlorhexidine allergy or have previously reacted to chlorhexidine.   On babies younger than 64 months of age.  How to use CHG solution     Use CHG only as told by your health care provider, and follow the instructions on the label.   Use CHG solution while taking a shower. Follow these steps when using CHG solution (unless your health care provider gives you different instructions):  1. Start the shower.  2. Use your normal soap and shampoo to wash your face and hair.  3. Turn off the shower or move out of the shower stream.  4. Pour the CHG onto a clean washcloth. Do not use any type of brush or rough-edged sponge.  5. Starting at your neck, lather your body down to your toes. Make sure you:   Pay special attention to the part of your body where you will be having surgery. Scrub this area for at least 1  minute.   Use the full amount of CHG as directed. Usually, this is one bottle.   Do not use CHG on your head or face. If the solution gets into your ears or eyes, rinse them well with water.   Avoid your genital area.   Avoid any areas of skin that have broken skin, cuts, or scrapes.   Scrub your back and under your arms. Make sure to wash skin folds.  6. Let the lather sit on your skin for 1-2  minutes or as long as told by your health care provider.  7. Thoroughly rinse your entire body in the shower. Make sure that all body creases and crevices are rinsed well.  8. Dry off with a clean towel. Do not put any substances on your body afterward, such as powder, lotion, or perfume.  9. Put on clean clothes or pajamas.  10. If it is the night before your surgery, sleep in clean sheets.  How to use CHG prepackaged cloths     Only use CHG cloths as told by your health care provider, and follow the instructions on the label.   Use the CHG cloth on clean, dry skin. Follow these steps when using a CHG cloth (unless your health care provider gives you different instructions):  1. Using the CHG cloth, vigorously scrub the part of your body where you will be having surgery. Scrub using a back-and-forth motion for 3 minutes. The area on your body should be completely wet with CHG when you are done scrubbing.  2. Do not rinse. Discard the cloth and let the area air-dry for 1 minute. Do not put any substances on your body afterward, such as powder, lotion, or perfume.  3. Put on clean clothes or pajamas.  4. If it is the night before your surgery, sleep in clean sheets.  Contact a health care provider if:   Your skin gets irritated after scrubbing.   You have questions about using your solution or cloth.  Get help right away if:   Your eyes become very red or swollen.   Your eyes itch badly.   Your skin itches badly and is red or swollen.   Your hearing changes.   You have trouble seeing.   You have swelling or  tingling in your mouth or throat.   You have trouble breathing.   You swallow any chlorhexidine.  Summary   Chlorhexidine gluconate (CHG) is a germ-killing (antiseptic) solution that is used to clean the skin. Cleaning your skin with CHG before surgery helps lower the risk for infection after surgery.   You may be given CHG to use at home. It may be in a bottle or in a prepackaged cloth to use on your skin. Carefully follow your health care provider's instructions and the instructions on the product label.   Do not use CHG if you have a chlorhexidine allergy.   Contact your health care provider if your skin gets irritated after scrubbing.  This information is not intended to replace advice given to you by your health care provider. Make sure you discuss any questions you have with your health care provider.  Document Released: 08/11/2012 Document Revised: 10/15/2017 Document Reviewed: 10/15/2017  Elsevier Interactive Patient Education  2019 [employer].

## 2020-06-08 ENCOUNTER — Ambulatory Visit (HOSPITAL_COMMUNITY): Payer: Commercial Managed Care - HMO | Admitting: General Surgery

## 2020-06-08 ENCOUNTER — Encounter (HOSPITAL_COMMUNITY): Payer: Self-pay

## 2020-06-08 ENCOUNTER — Ambulatory Visit (HOSPITAL_COMMUNITY): Payer: Commercial Managed Care - HMO | Admitting: Anesthesiology

## 2020-06-08 ENCOUNTER — Ambulatory Visit (HOSPITAL_BASED_OUTPATIENT_CLINIC_OR_DEPARTMENT_OTHER): Payer: Commercial Managed Care - HMO | Admitting: Anesthesiology

## 2020-06-08 ENCOUNTER — Encounter (HOSPITAL_COMMUNITY): Admission: RE | Disposition: A | Discharge status: Home Health | Payer: Self-pay | Attending: Surgery

## 2020-06-08 ENCOUNTER — Ambulatory Visit
Admission: RE | Admit: 2020-06-08 | Discharge: 2020-06-08 | Disposition: A | Discharge status: Home / Self Care | Payer: Commercial Managed Care - HMO | Attending: Surgery | Admitting: Surgery

## 2020-06-08 ENCOUNTER — Other Ambulatory Visit: Payer: Self-pay

## 2020-06-08 DIAGNOSIS — I10 Essential (primary) hypertension: Secondary | ICD-10-CM

## 2020-06-08 DIAGNOSIS — R109 Unspecified abdominal pain: Secondary | ICD-10-CM

## 2020-06-08 DIAGNOSIS — Z9049 Acquired absence of other specified parts of digestive tract: Secondary | ICD-10-CM | POA: Insufficient documentation

## 2020-06-08 DIAGNOSIS — K4020 Bilateral inguinal hernia, without obstruction or gangrene, not specified as recurrent: Secondary | ICD-10-CM

## 2020-06-08 DIAGNOSIS — Z8249 Family history of ischemic heart disease and other diseases of the circulatory system: Secondary | ICD-10-CM | POA: Insufficient documentation

## 2020-06-08 DIAGNOSIS — D176 Benign lipomatous neoplasm of spermatic cord: Secondary | ICD-10-CM | POA: Insufficient documentation

## 2020-06-08 DIAGNOSIS — G43909 Migraine, unspecified, not intractable, without status migrainosus: Secondary | ICD-10-CM | POA: Insufficient documentation

## 2020-06-08 DIAGNOSIS — Z87891 Personal history of nicotine dependence: Secondary | ICD-10-CM | POA: Insufficient documentation

## 2020-06-08 DIAGNOSIS — K219 Gastro-esophageal reflux disease without esophagitis: Secondary | ICD-10-CM | POA: Insufficient documentation

## 2020-06-08 DIAGNOSIS — Z91040 Latex allergy status: Secondary | ICD-10-CM | POA: Insufficient documentation

## 2020-06-08 DIAGNOSIS — G8918 Other acute postprocedural pain: Secondary | ICD-10-CM

## 2020-06-08 DIAGNOSIS — F419 Anxiety disorder, unspecified: Secondary | ICD-10-CM | POA: Insufficient documentation

## 2020-06-08 DIAGNOSIS — Z885 Allergy status to narcotic agent status: Secondary | ICD-10-CM | POA: Insufficient documentation

## 2020-06-08 DIAGNOSIS — F4310 Post-traumatic stress disorder, unspecified: Secondary | ICD-10-CM | POA: Insufficient documentation

## 2020-06-08 DIAGNOSIS — Z808 Family history of malignant neoplasm of other organs or systems: Secondary | ICD-10-CM | POA: Insufficient documentation

## 2020-06-08 DIAGNOSIS — G4489 Other headache syndrome: Secondary | ICD-10-CM | POA: Insufficient documentation

## 2020-06-08 DIAGNOSIS — Z888 Allergy status to other drugs, medicaments and biological substances status: Secondary | ICD-10-CM | POA: Insufficient documentation

## 2020-06-08 SURGERY — REPAIR, HERNIA, INGUINAL, LAPAROSCOPIC
Anesthesia: General | Site: Groin | Laterality: Bilateral | Wound class: Class I (Clean)

## 2020-06-08 MED ORDER — ROCURONIUM BROMIDE 100 MG/10ML IV SOLN
INTRAVENOUS | Status: DC | PRN
Start: 2020-06-08 — End: 2020-06-08
  Administered 2020-06-08 (×2): 10 mg via INTRAVENOUS
  Administered 2020-06-08: 50 mg via INTRAVENOUS
  Administered 2020-06-08: 10 mg via INTRAVENOUS

## 2020-06-08 MED ORDER — EPINEPHRINE ADDITIVE (ANESTHESIA)
Status: DC | PRN
Start: 2020-06-08 — End: 2020-06-08
  Administered 2020-06-08 (×2): 30 mL via PERINEURAL

## 2020-06-08 MED ORDER — MIDAZOLAM HCL 2 MG/2ML IJ SOLN
INTRAMUSCULAR | Status: DC | PRN
Start: 2020-06-08 — End: 2020-06-08
  Administered 2020-06-08 (×2): 2 mg via INTRAVENOUS

## 2020-06-08 MED ORDER — OXYCODONE HCL 5 MG OR TABS
5.0000 mg | ORAL_TABLET | ORAL | 0 refills | Status: AC | PRN
Start: 2020-06-08 — End: ?
  Filled 2020-06-08: qty 10, 2d supply, fill #0

## 2020-06-08 MED ORDER — DOCUSATE SODIUM 250 MG OR CAPS
250.0000 mg | ORAL_CAPSULE | Freq: Two times a day (BID) | ORAL | 0 refills | Status: AC
Start: 2020-06-08 — End: ?
  Filled 2020-06-08: qty 20, 10d supply, fill #0

## 2020-06-08 MED ORDER — PROPOFOL IV BOLUS 10 MG/ML
INTRAVENOUS | Status: DC | PRN
Start: 2020-06-08 — End: 2020-06-08
  Administered 2020-06-08: 09:00:00 30 mg via INTRAVENOUS
  Administered 2020-06-08: 40 mg via INTRAVENOUS
  Administered 2020-06-08: 50 mg via INTRAVENOUS
  Administered 2020-06-08: 180 mg via INTRAVENOUS
  Administered 2020-06-08: 30 mg via INTRAVENOUS
  Administered 2020-06-08: 50 mg via INTRAVENOUS

## 2020-06-08 MED ORDER — LACTATED RINGERS IV SOLN
INTRAVENOUS | Status: DC
Start: 2020-06-08 — End: 2020-06-08

## 2020-06-08 MED ORDER — LIDOCAINE HCL 2 % IJ SOLN WRAPPED RECORD
INTRAMUSCULAR | Status: DC | PRN
Start: 2020-06-08 — End: 2020-06-08
  Administered 2020-06-08: 80 mg via INTRAVENOUS

## 2020-06-08 MED ORDER — SUGAMMADEX SODIUM 500 MG/5ML IV SOLN
INTRAVENOUS | Status: DC | PRN
Start: 2020-06-08 — End: 2020-06-08
  Administered 2020-06-08: 200 mg via INTRAVENOUS

## 2020-06-08 MED ORDER — OXYCODONE HCL 5 MG OR TABS
5.0000 mg | ORAL_TABLET | Freq: Once | ORAL | Status: DC | PRN
Start: 2020-06-08 — End: 2020-06-08

## 2020-06-08 MED ORDER — DEXAMETHASONE SODIUM PHOSPHATE 4 MG/ML IJ SOLN (CUSTOM)
INTRAMUSCULAR | Status: DC | PRN
Start: 2020-06-08 — End: 2020-06-08
  Administered 2020-06-08: 8 mg via INTRAVENOUS

## 2020-06-08 MED ORDER — SUGAMMADEX SODIUM 200 MG/2ML IV SOLN
INTRAVENOUS | Status: AC
Start: 2020-06-08 — End: 2020-06-08
  Filled 2020-06-08: qty 2

## 2020-06-08 MED ORDER — FENTANYL CITRATE (PF) 50 MCG/ML IJ SOLN (WRAPPED RECORD) ~~LOC~~
25.0000 ug | INTRAMUSCULAR | Status: DC | PRN
Start: 2020-06-08 — End: 2020-06-08

## 2020-06-08 MED ORDER — MIDAZOLAM HCL 2 MG/2ML IJ SOLN
INTRAMUSCULAR | Status: AC
Start: 2020-06-08 — End: 2020-06-08
  Filled 2020-06-08: qty 2

## 2020-06-08 MED ORDER — ONDANSETRON HCL 4 MG/2ML IV SOLN
INTRAMUSCULAR | Status: DC | PRN
Start: 2020-06-08 — End: 2020-06-08
  Administered 2020-06-08: 4 mg via INTRAVENOUS

## 2020-06-08 MED ORDER — ONDANSETRON HCL 4 MG/2ML IV SOLN
4.0000 mg | Freq: Once | INTRAMUSCULAR | Status: DC | PRN
Start: 2020-06-08 — End: 2020-06-08

## 2020-06-08 MED ORDER — SENNA 8.6 MG OR TABS
17.2000 mg | ORAL_TABLET | Freq: Every day | ORAL | 0 refills | Status: AC
Start: 2020-06-08 — End: ?
  Filled 2020-06-08: qty 20, 10d supply, fill #0

## 2020-06-08 MED ORDER — LACTATED RINGERS IV SOLN
INTRAVENOUS | Status: DC | PRN
Start: 2020-06-08 — End: 2020-06-08

## 2020-06-08 MED ORDER — ACETAMINOPHEN 325 MG PO TABS
975.0000 mg | ORAL_TABLET | Freq: Once | ORAL | Status: AC
Start: 2020-06-08 — End: 2020-06-08
  Administered 2020-06-08 (×2): 975 mg via ORAL

## 2020-06-08 MED ORDER — LIDOCAINE HCL (PF) 1 % IJ SOLN
0.1000 mL | Freq: Once | INTRAMUSCULAR | Status: DC | PRN
Start: 2020-06-08 — End: 2020-06-08

## 2020-06-08 MED ORDER — HYDROMORPHONE HCL 1 MG/ML IJ SOLN
0.5000 mg | INTRAMUSCULAR | Status: DC | PRN
Start: 2020-06-08 — End: 2020-06-08

## 2020-06-08 MED ORDER — FENTANYL CITRATE (PF) 250 MCG/5ML IJ SOLN
INTRAMUSCULAR | Status: DC | PRN
Start: 2020-06-08 — End: 2020-06-08
  Administered 2020-06-08 (×2): 25 ug via INTRAVENOUS
  Administered 2020-06-08: 100 ug via INTRAVENOUS

## 2020-06-08 MED ORDER — MEPERIDINE HCL 25 MG/ML IJ SOLN
12.5000 mg | INTRAMUSCULAR | Status: DC | PRN
Start: 2020-06-08 — End: 2020-06-08

## 2020-06-08 MED ORDER — FENTANYL CITRATE (PF) 50 MCG/ML IJ SOLN (WRAPPED RECORD) ~~LOC~~
50.0000 ug | INTRAMUSCULAR | Status: DC | PRN
Start: 2020-06-08 — End: 2020-06-08
  Administered 2020-06-08: 50 ug via INTRAVENOUS
  Filled 2020-06-08: qty 1

## 2020-06-08 MED ORDER — FENTANYL CITRATE (PF) 250 MCG/5ML IJ SOLN
INTRAMUSCULAR | Status: AC
Start: 2020-06-08 — End: 2020-06-08
  Filled 2020-06-08: qty 5

## 2020-06-08 MED ORDER — DIPHENHYDRAMINE HCL 50 MG/ML IJ SOLN
12.5000 mg | Freq: Once | INTRAMUSCULAR | Status: DC | PRN
Start: 2020-06-08 — End: 2020-06-08

## 2020-06-08 MED ORDER — KETOROLAC TROMETHAMINE 30 MG/ML IJ SOLN
INTRAMUSCULAR | Status: DC | PRN
Start: 2020-06-08 — End: 2020-06-08
  Administered 2020-06-08: 30 mg via INTRAVENOUS

## 2020-06-08 MED ORDER — FENTANYL CITRATE (PF) 250 MCG/5ML IJ SOLN
INTRAMUSCULAR | Status: DC | PRN
Start: 2020-06-08 — End: 2020-06-08
  Administered 2020-06-08 (×2): 100 ug via INTRAVENOUS

## 2020-06-08 MED ORDER — LABETALOL HCL 5 MG/ML IV SOLN
5.0000 mg | INTRAVENOUS | Status: DC | PRN
Start: 2020-06-08 — End: 2020-06-08

## 2020-06-08 MED ORDER — FENTANYL CITRATE (PF) 100 MCG/2ML IJ SOLN
INTRAMUSCULAR | Status: AC
Start: 2020-06-08 — End: 2020-06-08
  Filled 2020-06-08: qty 2

## 2020-06-08 MED ORDER — NALOXONE HCL 0.4 MG/ML IJ SOLN
0.1000 mg | INTRAMUSCULAR | Status: DC | PRN
Start: 2020-06-08 — End: 2020-06-08

## 2020-06-08 MED ORDER — CEFAZOLIN SODIUM 1 GM IJ SOLR
INTRAMUSCULAR | Status: DC | PRN
Start: 2020-06-08 — End: 2020-06-08
  Administered 2020-06-08 (×2): 2000 mg via INTRAVENOUS

## 2020-06-08 SURGICAL SUPPLY — 43 items
APPLICATOR CHLORAPREP 26ML, ~~LOC~~ (Prep Solutions) IMPLANT
BALLOON BULB EXTRA VIEW AUTO SUTURE, OVAL (Lap/Endo/Arthroscopy) ×2 IMPLANT
BALLOON TROCAR STRUCTURAL AUTO SUTURE 10MM (Lap/Endo/Arthroscopy) ×6 IMPLANT
BANDAGE, GAUZE ROLL, KERLIX 4.5" X 4.1YD (Dressings/packing) ×2 IMPLANT
BLADE SURGEON #11 STERILE (Knives/Blades) IMPLANT
CLIPPER BLADE ASSY FOR 9670 CLIPPER (Knives/Blades) IMPLANT
CLIPPER SURGICAL SENSICLIP PINK (Misc Surgical Supply) ×2 IMPLANT
COVER LIGHT HANDLE RIGID (2 PACK) (Misc Surgical Supply) ×2 IMPLANT
DEVICE ABSORBABLE SECURE STRAP 5MM 12 CLIPS (Suture) ×2 IMPLANT
DEVICE ABSORBABLE SECURE STRAP 5MM 25 CLIPS (Suture) IMPLANT
DISSECTOR 3 ENDOPATH BLUNT TIP 5MM X 40.5CM (Lap/Endo/Arthroscopy) IMPLANT
DRAPE ENDOSCOPY GENERAL 269CM X 314CM (Drapes/towels) ×2 IMPLANT
ELECTRODE CLEANCOAT FLAT L-HOOK LAPAROSCOPIC 36CM (Cautery) ×2 IMPLANT
GLOVE BIOGEL INDICATOR UNDERGLOVE SIZE 7 (Gloves/Gowns) ×2 IMPLANT
GLOVE BIOGEL PI INDICATOR SIZE 7 (Gloves/Gowns) ×4 IMPLANT
GLOVE BIOGEL PI INDICATOR SIZE 7.5 (Gloves/Gowns) ×4 IMPLANT
GLOVE BIOGEL PI ULTRATOUCH SIZE 6.5 (Gloves/Gowns) ×2 IMPLANT
GLOVE BIOGEL PI ULTRATOUCH SIZE 7 (Gloves/Gowns) ×8 IMPLANT
GLOVE BIOGEL PI ULTRATOUCH SIZE 7.5 (Gloves/Gowns) ×2 IMPLANT
GLOVE SURGEON BIOGEL SIZE 7 (Gloves/Gowns) IMPLANT
GOWN MICRO COOL LG BLUE, AAMI LVL 4 (Gloves/Gowns) ×6 IMPLANT
GOWN SURGICAL ULTRA XL BLUE, AAMI LVL 3 (Gloves/Gowns) IMPLANT
MESH ULTRAPRO 30X30 (Patch/Mesh) ×2 IMPLANT
PAD GROUND VALLEYLAB REM ADULT E7507 (Misc Surgical Supply) ×2 IMPLANT
PROTECTOR ULNAR NERVE PAD, YELLOW (Misc Surgical Supply) ×2 IMPLANT
SET TUBING PNEUMOCLEAR SMOKE EVACUATION HIGH FLOW (Tubing/Suction) ×2 IMPLANT
SKIN AFFIX 0.4ML HV (Dressings/packing) IMPLANT
SLEEVE SCD KNEE MEDIUM (Misc Medical Supply) ×2 IMPLANT
SOLUTION IRR POUR BTL 0.9% NS 1000ML (Non-Pharmacy Meds/Solutions) ×2 IMPLANT
SOLUTION IRR POUR BTL H20 1000ML (Non-Pharmacy Meds/Solutions) ×2 IMPLANT
SOLUTION IV 0.9% NS 1000ML (Non-Pharmacy Meds/Solutions) IMPLANT
SPONGE LAP RF DETECT 18" X 18" XRAY STERILE (Sponges) IMPLANT
STAPLER PROXIMATE SKIN 35 WIDE (Staplers and staple reloads) ×2 IMPLANT
SUCTION IRRIGATOR STRYKEFLOW2- SINGLE USE (Lap/Endo/Arthroscopy) IMPLANT
SURGICAL PACK LAPAROSCOPIC (Procedure Packs/kits) IMPLANT
SUTURE MONOCRYL PLUS 4-0 27" PS-2 MCP426 (Suture) ×2 IMPLANT
SUTURE VICRYL PLUS 0 27" UR6 (VCP603) (Suture) ×1
SUTURE VICRYL PLUS 0 27" UR6 VCP603 (Suture) ×1 IMPLANT
SUTURE VICRYL PLUS 2-0 27" SH VCP417 (Suture) IMPLANT
TOWELS OR BLUE 4-PACK STERILE, DISPOSABLE (Drapes/towels) ×2 IMPLANT
TRAY FOLEY SURESTEP LUBRI-SIL I.C.16FR URIMETER, LF (Lines/Drains) IMPLANT
TROCAR ETHICON NON-SHIELDED 5MM X 65MM, THREADED SLEEVE (Lap/Endo/Arthroscopy) ×4 IMPLANT
WIPE ANTI-FOG STERILE (Misc Surgical Supply) ×2 IMPLANT

## 2020-06-08 NOTE — Anesthesia Procedure Notes (Addendum)
Regional Anesthesia Block Procedure Note  Date & Time: Date & Time: 06/08/2020 6:37 AM    Short Procedural Summary (Full description of each separate nerve block  below):  Procedure #1 -  Transversus Abdominis Plane.    Laterality - Bilateral.    Block type - Single injection.                          Medications Administrations:  Medications Administered at:  06/08/2020 6:37 AM    Indications  Procedure performed to address pain in the Abdomen.  Laterality Bilateral  Patient seen and examined in the PreOp area.  This procedure was performed at the request of the referring physician for post operative pain control.         Universal protocol  Universal Protocol: Verbal consent obtained, written consent obtained, risks and benefits discussed, patient states understanding of the procedure being performed, the patient's understanding of the procedure matches consent given, procedure consent matches procedure scheduled, relevant documents present and verified, test results available and properly labeled, site marked, imaging studies available, required blood products, implants, devices, and special equipment available and Immediately prior to procedure a time out was called to verify the correct patient, procedure, equipment, support staff and site/side marked as required  Consent given by: patient  Patient identity confirmed by: verbally with patient, arm band, provided demographic data and hospital-assigned identification number  H&P: H&P Reviewed  Vital signs monitored: Yes  Sterile skin prep: Choloraprep (Chorahexadine)  O2 administered: Face Mask    Patient Instructions        Block Procedure #1  Block procedure type: Transversus Abdominis Plane  Procedure laterality: Bilateral    Nerve block:  Single injection  Needle gauge to anesthetize skin entry site: 27G  Needle gauge for procedure: 20g and Touhy  Block technique: Ultrasound guided  Ultrasound guidance used to identify relevant anatomy.  Ultrasound guidance used  to visualize local anesthetic spread around nerve(s).  Ultrasound guidance used to guide needle to targeted nerve and avoid vascular puncture.    Brief medication summary  IV sedation as documented on MAR. See MAR for full medication details  Local anesthetic medication: Bupivacaine    Local anesthetic volume: each side 30 mL  Complications: None  Procedure #1 comments: I, Phebe Colla, MD, was present for the entire procedure.    Phebe Colla, MD  06/08/2020 7:47 AM        Block Procedure #2    Block Procedure #3    Block Procedure #4    Block Procedure #5    Block Procedure #6

## 2020-06-08 NOTE — H&P (Signed)
General Surgery History and Physical    CC:   Bilateral inguinal hernias    History of present illness:  Brett Palmer is a 54 year old male here for laparoscopic bilateral inguinal hernia repair. Per most recent clinic note:    Brett Palmer is a 54 year old male who presents to general surgery clinic for evaluation of R inguinal hernia. He started noticing a bulge on the R side ~1 year ago near the time that he had a CT A/P for appendicitis which also showed small bilateral fat containing inguinal hernias R > L. For the last few months he has been noticing the bulge larger at the end of the day associated with some burning groin pain. He denies episodes of incarceration or strangulation or ED visits due to the pain. He denies obstructive symptoms. He denies BPH or urinary problems.     Prior surgeries include a lap appendectomy last year and a lap cholecystectomy. Of note patient does have SVTs for which he has been evaluated by Cardiology at [facility] but denies chest pain, SOB, and is able to climb a flight of stairs without any issues.     PMH:  Past Medical History:   Diagnosis Date    Alcohol use     Gastroesophageal reflux disease     Hypertension     Migraine headache     part of gulf war syndrome    PTSD (post-traumatic stress disorder) 1995-present    treated by Dr. 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   Tobacco Use    Smoking status: Former Smoker     Packs/day: 0.50     Years: 10.00     Pack years: 5.00     Types: Cigarettes      Quit date: 01/11/1990     Years since quitting: 30.4    Smokeless tobacco: Never Used   Substance and Sexual Activity    Alcohol use: Yes     Comment: down to 1/2-3/4 bottle of wine    Drug use: No     Comment: Denies MJ use    Sexual activity: Yes     Partners: Female   Social Activities of Daily Living Present    Not on file   Social History Narrative    Not on file       FH:  Family History   Problem Relation Name Age of Onset    Cancer Father          melanoma, died at age 53    Heart Disease Mother          CHF, thought to have passed from MI    Heart Disease Maternal Aunt          CHF       Medications:  No current facility-administered medications for this encounter.  Allergies:  Allergies   Allergen Reactions    Compazine Anxiety     Tolerates phenergan and zofran    Latex Rash    Atarax [Hydroxyzine] Other     Bad reaction to med    Gabapentin Other     "crawl out of my skin"    Norco [Hydrocodone-Acetaminophen] Itching    Vicodin [Apap-Fd&C Yellow #10 [place]-Hydrocodone] Anxiety     Tolerates morphine and dilaudid       Review of Systems  12 point ROS negative except what is noted in HPI    Exam:   BP 113/72 (BP Location: Right arm, BP Patient Position: Sitting)    Pulse 64    Temp 96.8 F (36 C)    Resp 16    Ht 6' (1.829 m)    Wt 91.8 kg (202 lb 6.4 oz)    SpO2 98%    BMI 27.45 kg/m   NAD, awake, alert/oriented  Regular rate and rhythm  Non-labored breathing on room air  Abdomen soft, non-distended, non-tender  Extremities warm, well-perfused  Skin warm and dry    Labs:  Lab Results   Component Value Date    WBC 5.4 05/20/2020    HGB 14.8 05/20/2020    HCT 43.1 05/20/2020    PLT 158 05/20/2020     No results found for: INR, PTT  Lab Results   Component Value Date    NA 138 05/20/2020    K 4.3 05/20/2020    CL 100 05/20/2020    BICARB 24 05/20/2020    BUN 8 05/20/2020    CREAT 0.71 05/20/2020    GLU 103 (H) 05/20/2020     9.6 05/20/2020    MG 2.3 01/24/2020    PHOS 3.6 01/24/2020           Assessment & Plan:  54 year old male here for lap BIHR    - proceed as planned    Jolinda Croak, MD  PGY2 General Surgery    Discussed with attending Dr. Kathrin Greathouse    --

## 2020-06-08 NOTE — Brief Op Note (Signed)
BRIEF OPERATIVE NOTE    DATE: 06/08/2020  TIME: 9:37 AM    PREOPERATIVE DIAGNOSIS: bilateral inguinal hernias    POSTOPERATIVE DIAGNOSIS: bilateral inguinal hernias    PROCEDURE INFORMATION:  Procedure(s):  REPAIR, HERNIA, BILATERAL INGUINAL, LAPAROSCOPIC    ATTENDING SURGEON:   Surgeon(s) and Role:     * Doucet, Cherly Hensen, MD - Primary     * Jolinda Croak, MD - Assistant Surgeon    ANESTHESIA: general    FINDINGS: total extraperitoneal approach used. Direct right inguinal and indirect left inguinal hernias identified and reduced. Mesh placed bilaterally. Good hemostasis achieved.    SPECIMENS: none    Fluids/Blood Products:      IV Fluids: 700 mL    Blood Products: none    EBL: 5 mL    Urine Output: none    COMPLICATIONS: none apparent    DISPOSITION: stable to PACU

## 2020-06-08 NOTE — Op Note (Signed)
DATE OF SERVICE:  06/08/2020    PREOPERATIVE DIAGNOSIS:  Bilateral inguinal hernias.    POSTOPERATIVE DIAGNOSIS:  Bilateral inguinal hernias.    PROCEDURE PERFORMED:  Laparoscopic totally extraperitoneal repair of  bilateral inguinal hernias.     SURGEON/STAFF:  Clinton Quant, MD    RESIDENT: Cherrie Gauze, MD    ANESTHESIA:  General.    INDICATIONS:  This 54 year old man presented to our clinic after  being referred for bilateral groin masses.  On examination, he had an  obvious hernia of the right groin as well as the suggestion of a  hernia in the left inguinal canal.  We described the procedure of  laparoscopic bilateral inguinal hernias to the patient including the  risks of infection, bleeding, recurrence, reoperation, use of mesh,  and the patient provided written consent after indicating his  questions had been answered.     DESCRIPTION OF PROCEDURE:  On the morning of the surgery, the patient  was seen in the preoperative area.  Consent was reviewed and the skin  was marked using universal protocol.  The patient was then brought to  the operating room and placed on the OR table in the supine position.   A general endotracheal anesthetic was obtained.  He was prepped and  draped in the usual fashion.  A time-out for safety was performed.  The patient had received 2 g of Ancef prior to skin incision.  The  procedure was started by making a vertical incision in the inferior  rim of the umbilicus for a length of 12 mm.  This incision was  carried down to the rectus fascia with the use of the S retractors  for dissection.  We then made a vertical incision in the left rectus  sheath just to the left of the midline sufficient to introduce a Pean  clamp into the rectus sheath.  We then used this incision in the  rectus sheath to introduce the Spacemaker balloon dissector.  This  was passed down the rectus sheath until the tip touched the symphysis  pubis, and then it was placed behind the symphysis pubis  about 2 cm.  While holding a 0 degree 10 mm port within the lumen of the balloon,  we insufflated the balloon for 30 pumps and achieved good  preperitoneal dissection.  We then withdrew the camera through the  balloon.  We then inserted the structural balloon trocar via the same  incision, inflated this, reinspected, and saw that we had good  dissection effect of both preperitoneal inguinal spaces.  Next, we  placed another port in the midline about 10 cm below the umbilicus  through a transverse 5 mm incision using a thoracoscopic 5 mm port.  We placed another 5 mm thoracoscopic port about 5 cm below this,  again in the midline through a 5 mm transverse incision.  We started  on the right side.  On the right side, we saw that there was a direct  hernia.  The contents were taken down bluntly and reduced until they  laid on the peritoneal floor.  We then also inspected the inguinal  ligament and inguinal cord on this side.  We saw that there was no  significant indirect component and the vas was identified.  We then  turned our attention to the left side.  On the left side, we saw that  the internal and external rings were fairly patulous.  The cord was  thick on this side, and some  blunt dissection revealed that there was  a large cord lipoma as well as a small indirect hernia, which we took  down with blunt dissection until it was lying loose on the back of  the peritoneal floor.  Having achieved satisfactory dissection on  both sides, we then placed a piece of 10 x 15 Ultrapro mesh.  We  first placed this on the right side.  We made sure it overlaid the  internal and external rings.  We tacked it first to Iona ligament  at the insertion of the inguinal ligament and then superiorly near  the rectus sheath and more laterally on the conjoined tendon.  We  used a total of 5 tacks on the right side.  On the left side, we  placed another 10 x 15 piece of mesh with some overlap in the  midline.  This also was tacked up  in the same way as the right side  of the mesh using a total of 7 tacks.  Having achieved a satisfactory  positioning of the mesh, we then carefully desufflated the abdomen  while holding the mesh in position.  We saw that this was  satisfactory and the mesh laid where we wished it to lie.  We then  withdrew all ports after desufflation of the abdomen.  We closed the  fascial incision using a figure-of-eight suture of 0 Vicryl suture  with a UR-6 needle.  We then closed all 3 skin incisions with  subcuticular 4-0 Monocryl and Dermabond.  The patient tolerated the  procedure well and then was brought to the PACU in good condition.  Of note, the patient received a TAP anesthetic block preoperatively.  The patient did not have a Foley catheter intraoperatively.  The  patient received a total of 800 mL of crystalloid.  No blood products  were given.  Blood loss was approximately 5 mL.  Dr. Kathrin Greathouse, the  attending surgeon, was scrubbed and present for the entire procedure.     PLAN:  The patient is to discharge home later today.  We will see in  clinic in approximately 1 to 2 weeks' time, sooner if there are any  issues.       Job #:  [id number]

## 2020-06-08 NOTE — Discharge Instructions (Signed)
Your diet at home should be:   Regular diet    Your activity level at home should be:    Limited activity for 6 weeks, as specified below. However, walking is encouraged, outside and around the block, if you are able.    Specific activity restrictions:    · Do not drive while taking narcotic pain medications.  · Do not work with heavy or complex machinery while taking narcotic pain medications.  · Do not lift more than 10 pounds for a period of 6 weeks.    Wound or tube care instructions:  Keep area clean and dry.  Let soapy water run over the area in the shower daily.  Pat the area dry.     Your medication list is located on this After Visit Summary in the Current Discharge Medication List section.  Your nurse will review this information with you before you leave the hospital.    It is very important for you to keep a current medication list with you in order to assist your doctors with your medical care.  Bring this After Visit Summary with you to your follow up appointments.    Reasons to Contact a Doctor Urgently:  Severe abdominal pain.  Nausea and vomiting.  Severe abdominal bloating.  Redness or swelling at the surgical (or wound) site.  Discharge or leakage from the surgical (or wound) site.  Fevers or chills.  Bleeding from surgical (or wound) site.  Shortness of breath or difficulty breathing.  Chest pains or palpitations.  Unusual neurologic changes  Pain, redness or swelling in your legs    ***Call 911 or return to the hospital immediately if:  Increased or uncontrolled pain***    You should contact either your primary care physician or your hospital physician for any of the following reasons: any concern not listed    If you have any questions about your hospital care, your medications, or if you have new or concerning symptoms soon after going home from the hospital, and you need to contact your hospital physician, your hospital physician can be contacted in the following manner:  [facility]  operator at [phone].    Once you are able to see your primary care physician (PCP), your PCP will then be responsible for further medication refills, or appointment referrals.    What Needs to Happen Next After Discharge -- Appointments and Follow Up    Any appointments already scheduled at [facility] will be listed in the Future Appointments section at the top of this After Visit Summary.  Any appointments that have been requested, but have not yet been scheduled, will be listed below that under Post Discharge Referrals.    Sometimes tests performed in the hospital do not yet have results by the time a patient goes home.  The following key tests will need to be followed up at your next appointment: Pathology evaluation of surgical specimens

## 2020-06-08 NOTE — Plan of Care (Signed)
Problem: Promotion of Perioperative Health and Safety  Goal: Promotion of Health and Safety of the Perioperative Patient  Description: The patient remains safe, receives treatment appropriate to the surgical intervention and patient's physiological needs and is discharged or transferred to the appropriate level of care.    Information below is the current care plan.  06/08/2020 0957 by Serena Croissant, RN  Outcome: Progressing  Flowsheets  Taken 06/08/2020 0957  Guidelines: PACU  Taken 06/08/2020 0934  Individualized Interventions/Recommendations #1: pain management  Individualized Interventions/Recommendations #2 (if applicable): comfort measures  Individualized Interventions/Recommendations #3 (if applicable): safety precautions  06/08/2020 0934 by Serena Croissant, RN  Outcome: Progressing  Flowsheets (Taken 06/08/2020 [phone])  Guidelines: PACU  Individualized Interventions/Recommendations #1: pain management  Individualized Interventions/Recommendations #2 (if applicable): comfort measures  Individualized Interventions/Recommendations #3 (if applicable): safety precautions

## 2020-06-08 NOTE — Anesthesia Postprocedure Evaluation (Signed)
Anesthesia Post Note    Patient: Brett Palmer    Procedure(s) Performed: Procedure(s):  REPAIR, HERNIA, BILATERAL INGUINAL, LAPAROSCOPIC      Final anesthesia type: General    Patient location: PACU    Post anesthesia pain: adequate analgesia    Mental status: awake, alert  and oriented    Airway Patent: Yes    Last Vitals:   Vitals Value Taken Time   BP 106/60 06/08/20 1100   Temp 36.2 C 06/08/20 1030   Pulse 79 06/08/20 1111   Resp 20 06/08/20 1111   SpO2 97 % 06/08/20 1111   Vitals shown include unvalidated device data.     Post vital signs: stable    Hydration: adequate    N/V:no    Anesthetic complications: no    Plan of care per primary team.

## 2020-06-08 NOTE — Plan of Care (Signed)
Problem: Promotion of Perioperative Health and Safety  Goal: Promotion of Health and Safety of the Perioperative Patient  Description: The patient remains safe, receives treatment appropriate to the surgical intervention and patient's physiological needs and is discharged or transferred to the appropriate level of care.    Information below is the current care plan.  06/08/2020 1229 by Serena Croissant, RN  Outcome: Resolved

## 2020-06-08 NOTE — Plan of Care (Incomplete)
Problem: Promotion of Perioperative Health and Safety  Goal: Promotion of Health and Safety of the Perioperative Patient  Description: The patient remains safe, receives treatment appropriate to the surgical intervention and patient's physiological needs and is discharged or transferred to the appropriate level of care.    Information below is the current care plan.  Outcome: Progressing  Flowsheets (Taken 06/08/2020 0934)  Guidelines: PACU  Individualized Interventions/Recommendations #1: pain management  Individualized Interventions/Recommendations #2 (if applicable): comfort measures  Individualized Interventions/Recommendations #3 (if applicable): safety precautions

## 2020-06-13 NOTE — Addendum Note (Signed)
Addendum  created 06/13/20 1016 by Chester Romero, [place]    Charge Capture section accepted, SmartForm saved

## 2020-06-16 ENCOUNTER — Encounter (INDEPENDENT_AMBULATORY_CARE_PROVIDER_SITE_OTHER): Payer: Self-pay | Admitting: Hospital

## 2020-06-20 ENCOUNTER — Ambulatory Visit (HOSPITAL_BASED_OUTPATIENT_CLINIC_OR_DEPARTMENT_OTHER): Payer: No Typology Code available for payment source | Admitting: Surgery

## 2020-07-02 ENCOUNTER — Other Ambulatory Visit: Payer: Self-pay

## 2020-07-02 ENCOUNTER — Ambulatory Visit (HOSPITAL_BASED_OUTPATIENT_CLINIC_OR_DEPARTMENT_OTHER): Payer: Commercial Managed Care - HMO | Admitting: Nurse Practitioner

## 2020-07-02 NOTE — Telephone Encounter (Signed)
Population Health Team Care Manager Patient Outreach     Notes    First call to engage patient in case management services.  PC to PT.  No answer.  Unable to leave VM    Plan  Follow up with pt

## 2020-07-05 NOTE — Telephone Encounter (Signed)
Population Health Team Care Manager Patient Outreach     Notes    Second call to engage patient in case management services.  PC to PT.  No answer.  Left message regarding call.    Plan  Send unable to reach letter  Close case if no call received

## 2020-07-11 ENCOUNTER — Ambulatory Visit (HOSPITAL_BASED_OUTPATIENT_CLINIC_OR_DEPARTMENT_OTHER): Payer: Commercial Managed Care - HMO | Admitting: Gastroenterology

## 2020-07-13 NOTE — Telephone Encounter (Signed)
Referral received on 07/02/20 from rising risk list not referred      Population Health Team Care Manager Patient Outreach    No calls received after two outreach attempts and a mailed letter.    Plan  Close case due to unable to reach PT

## 2020-08-29 ENCOUNTER — Encounter (HOSPITAL_BASED_OUTPATIENT_CLINIC_OR_DEPARTMENT_OTHER): Payer: Commercial Managed Care - HMO | Admitting: Gastroenterology

## 2021-02-21 ENCOUNTER — Encounter (HOSPITAL_BASED_OUTPATIENT_CLINIC_OR_DEPARTMENT_OTHER): Payer: Self-pay | Admitting: Nurse Practitioner

## 2021-05-22 IMAGING — CT CT THORAX WO CONTRAST
2 of 5 series · 13 of 36 positions shown, 16 images · non-contrast
Comparison: None.

HISTORY: 55 year-old male with respiratory bronchiolitis interstitial lung disease.
TECHNIQUE: CT chest study was performed without IV contrast using axial images. Sagittal and coronal reconstructed images were obtained. 

Dose reduction technique used:  Automated exposure control and adjustment of the mA and/or kV according to patient size. CT count in previous 12-months: 0. 
Total radiation dose to patient is CTDIvol 3.30 mGy and DLP 143.00 mGy-cm.

[Series 4: coronal · coronal · 0.77mm/px · 3 of 71 slices shown]
[im 15/71  lung]
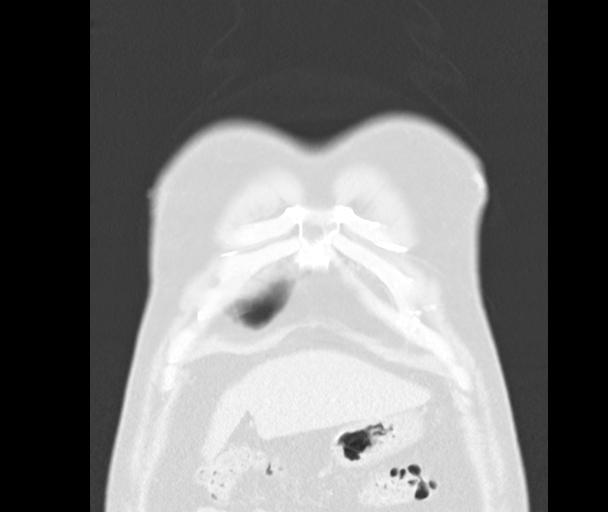
[im 29/71  lung]
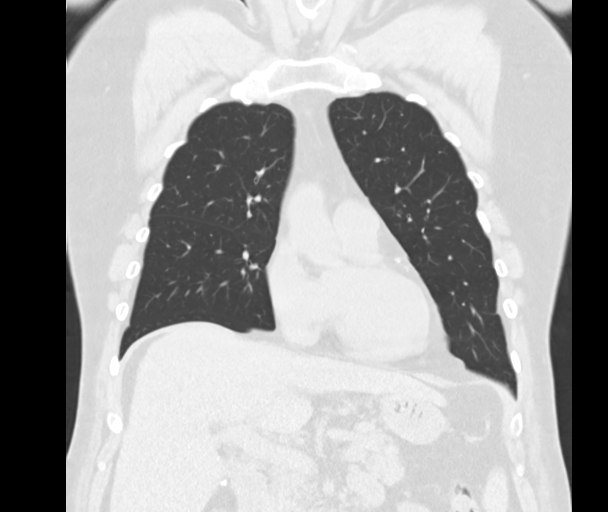
[im 43/71  lung]
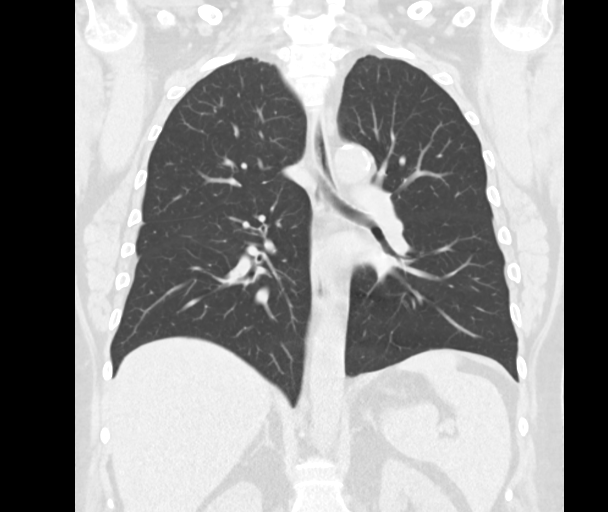

[Series 8: lung · axial · 0.77mm/px · z∈[-1327,-1015]mm · 10 of 176 slices shown, 13 images]
[im 10/176  mediastinal]
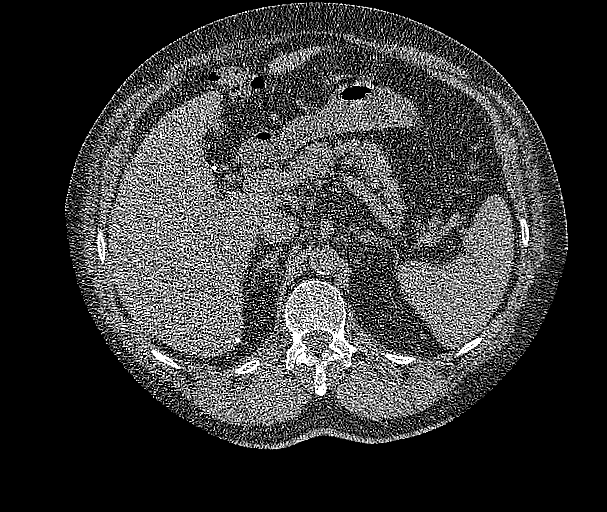
[im 10/176  lung]
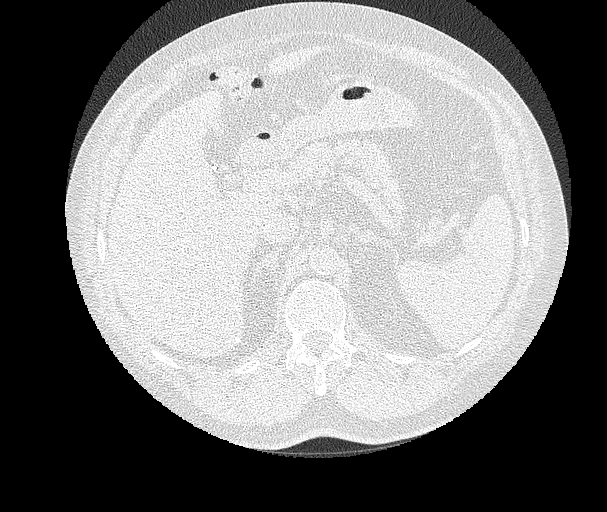
[im 30/176  lung]
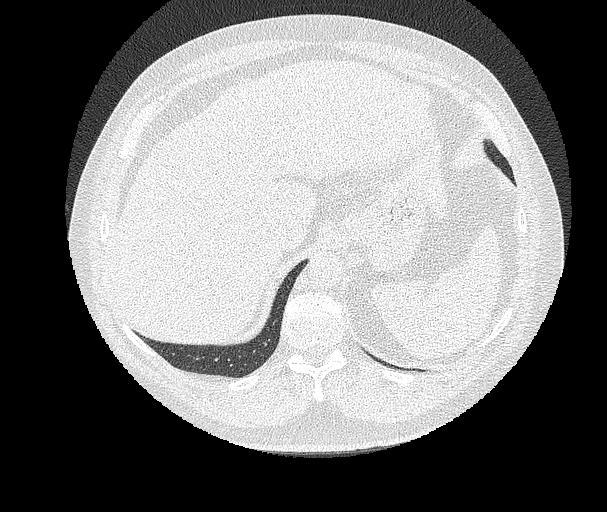
[im 49/176  lung]
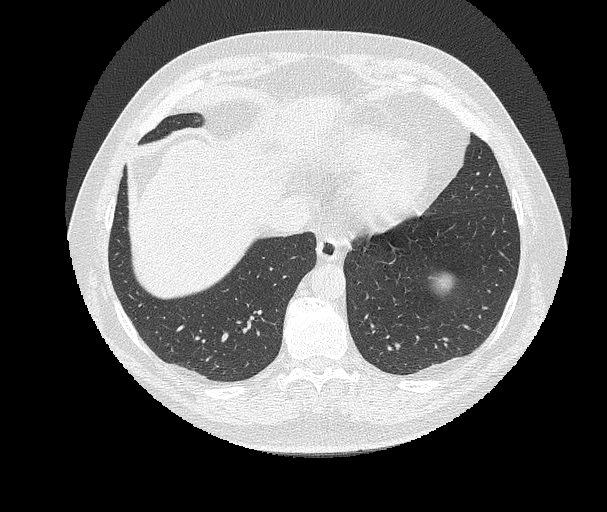
[im 59/176  lung]
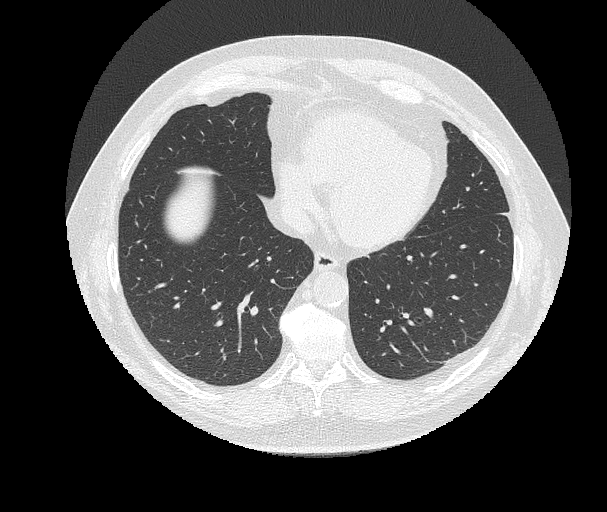
[im 78/176  mediastinal]
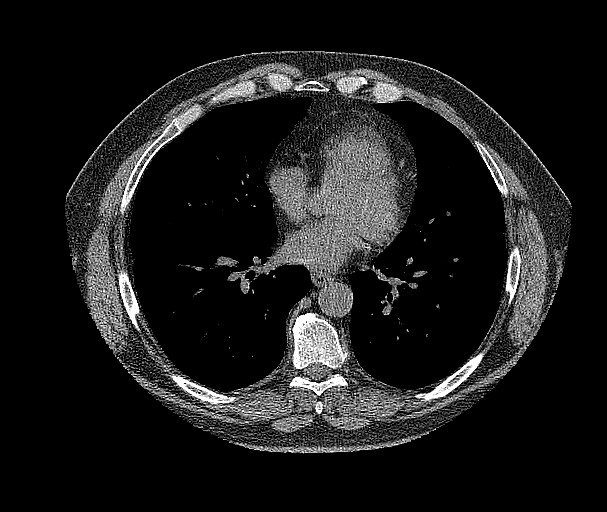
[im 78/176  lung]
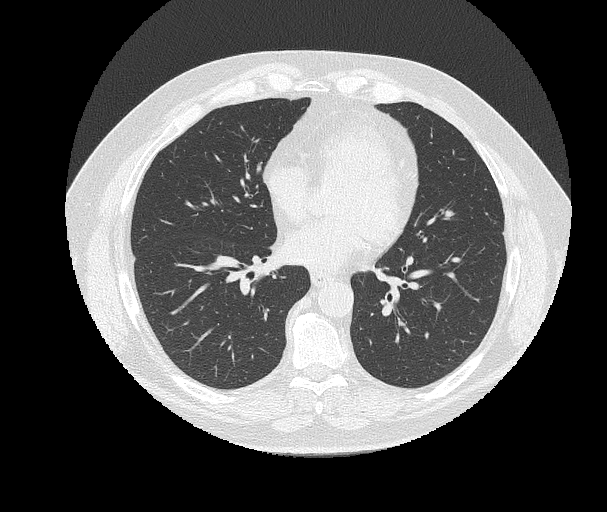
[im 98/176  lung]
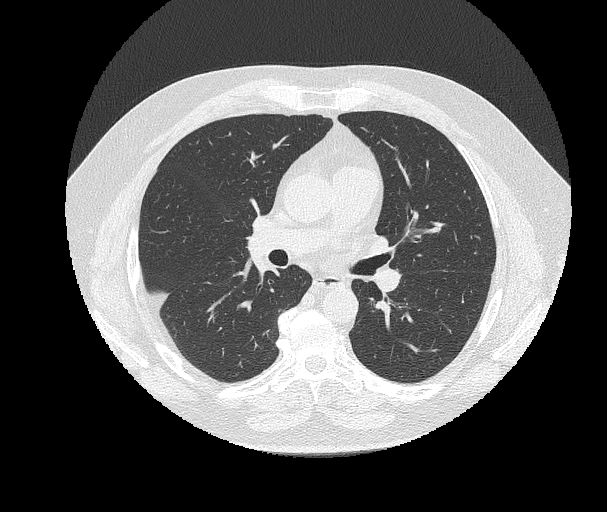
[im 117/176  lung]
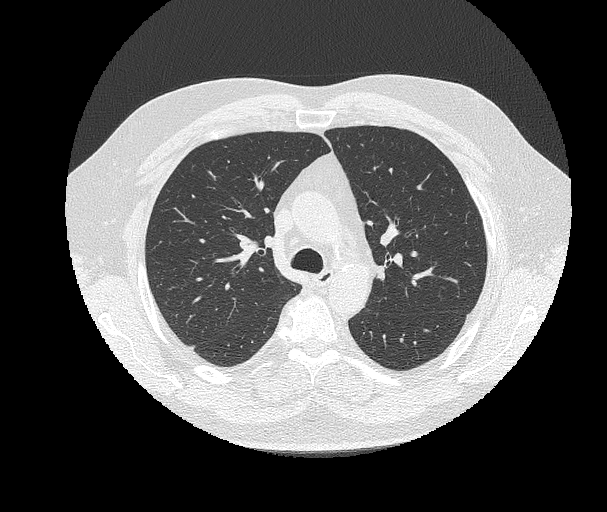
[im 127/176  lung]
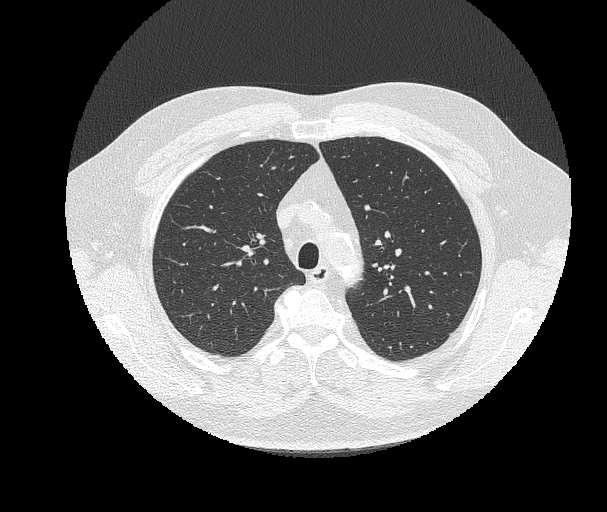
[im 146/176  mediastinal]
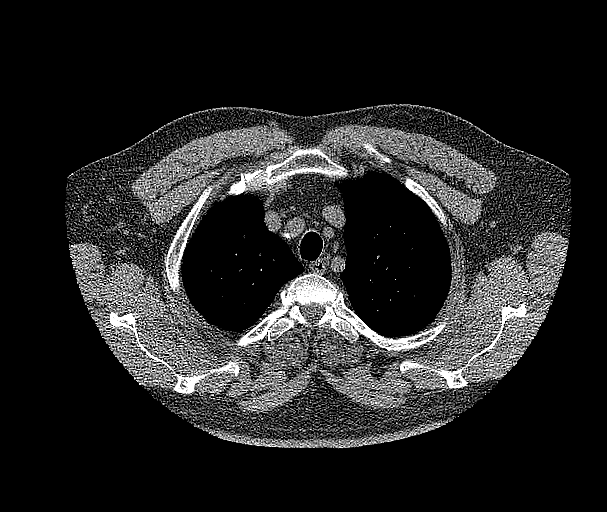
[im 146/176  lung]
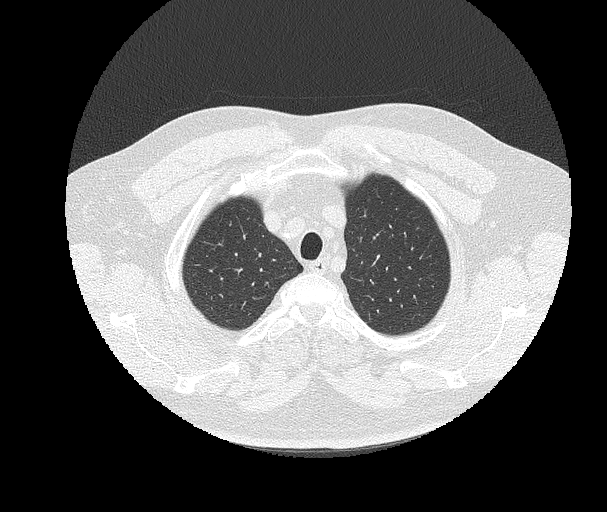
[im 166/176  lung]
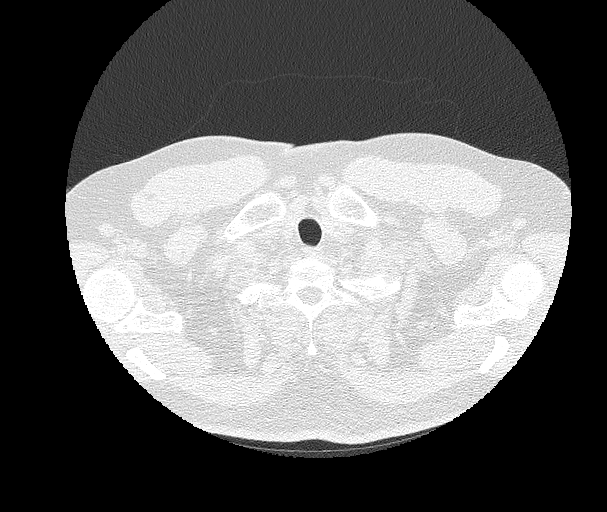

[13 of 36 positions shown; findings below may reference images not displayed]

FINDINGS: Lungs and airways: There are small 4 mm or less nodules identified in the left lung ([DATE], 71, 75). The remaining airways and the lungs are clear.

Thoracic inlet: The thoracic inlet is unremarkable.

Mediastinal and hilar lymphadenopathy: Nonspecific mediastinal lymph nodes are present.

Axilla: There is no evidence of axillary lymphadenopathy.

Heart and great vessels: Mild coronary arterial calcifications are present.

Soft tissues: Normal.

Abdomen: Limited noncontrast views of the upper abdomen show no abnormality within the visualized liver, spleen or kidneys. The adrenal glands are normal.

Bones: The visualized bony thorax is within normal limits.
IMPRESSION: 1.
4 mm or less nodules identified in the left lung. There is no CT evidence of interstitial lung disease or respiratory bronchiolitis.

2.
Mild coronary arterial calcifications.

3.
The remaining chest is unremarkable.

RECOMMENDATION:

CT low-dose lung screening study yearly if patient met inclusion criteria (Adults aged 50 to 80 years who have a 20 pack-year smoking history and currently smoke or have quit within the past 15 years). If patient does not meet the criteria for CT low-dose lung screening study, consider follow-up noncontrast enhanced CT chest study in 12 months if patient has a history of smoking.

## 2021-10-15 IMAGING — CR [HOSPITAL] L SPINE
2 series · 2 of 2 positions shown · non-contrast
Comparison: None

HISTORY: Low back pain
TECHNIQUE: Lumbar spine 2 views. No Charge

[t lumbar spine ap]
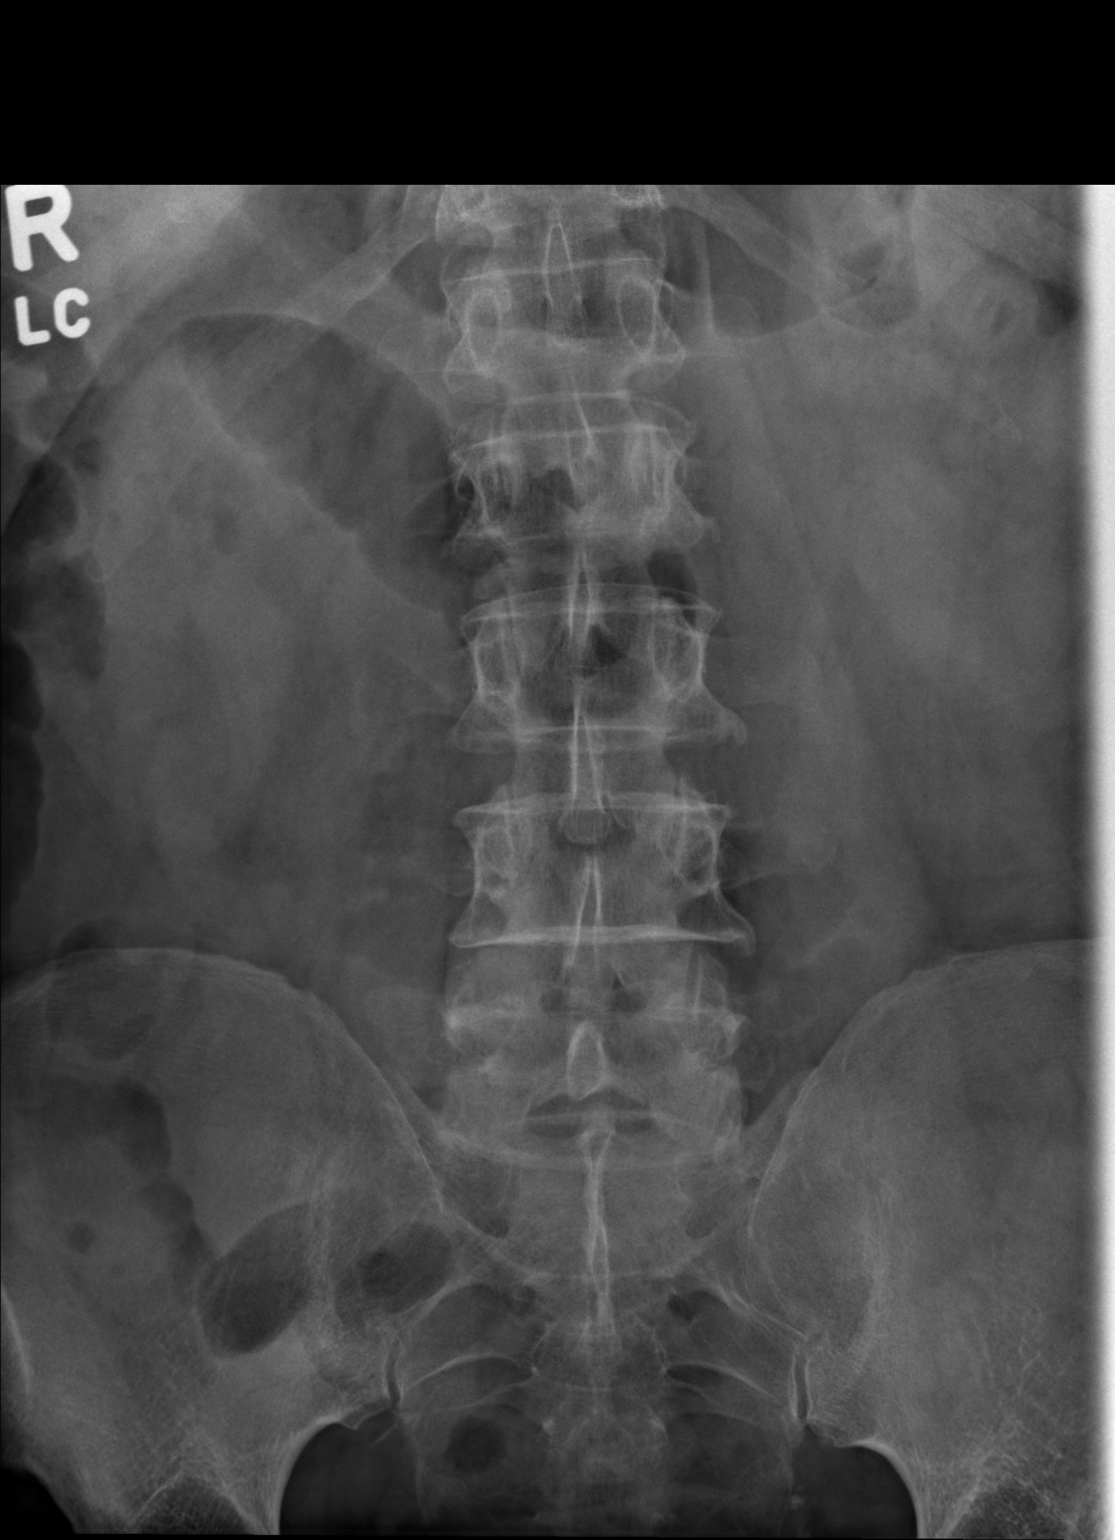

[t lumbar spine lat]
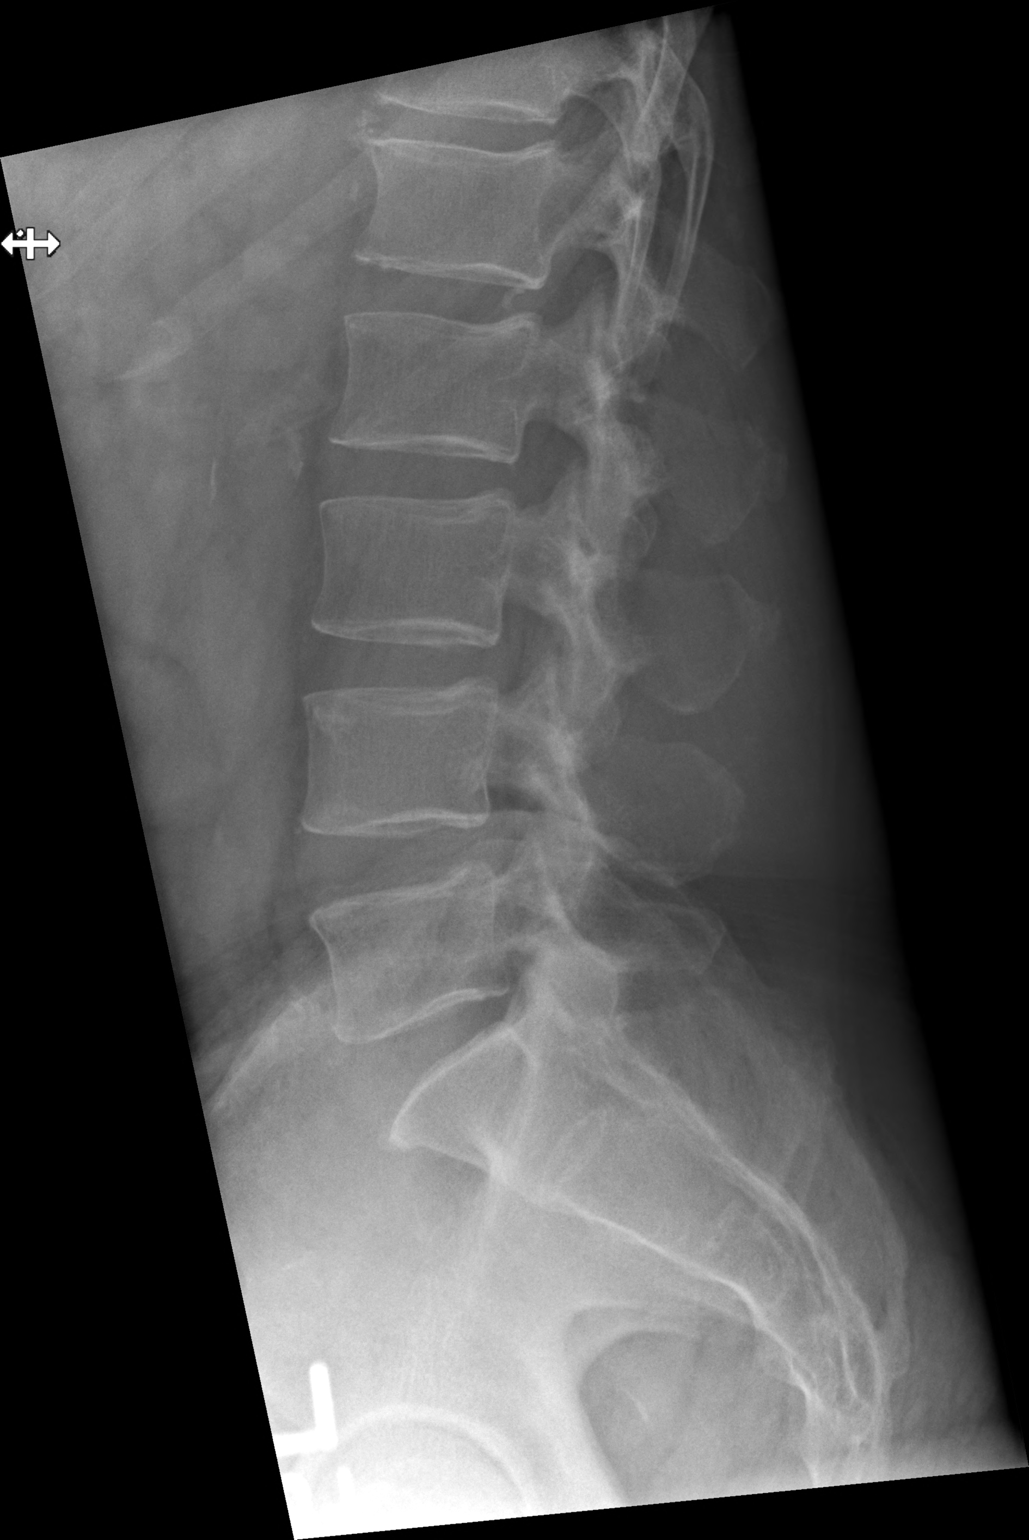

[2 of 2 positions shown; findings below may reference images not displayed]

FINDINGS: Two-view L-spine demonstrates total of 5 lumbar segments. Mild levoscoliosis. No anterolisthesis or retrolisthesis. Disc spaces appear adequately maintained. Mild facet arthropathy. Vascular calcifications of the aorta.
IMPRESSION: 5 lumbar segments. No fractures or destructive lesions.

## 2021-10-15 IMAGING — MR MRI LSPINE WO CONTRAST
5 of 7 series · 24 of 48 positions shown · non-contrast
Comparison: Same day radiographs

HISTORY: Lumbago with sciatica, right side
TECHNIQUE: Routine multiplanar MRI of the lumbar spine was performed without IV contrast.

[Series 2: bSSFP · axial · 8.0mm · 1.37mm/px · z∈[-61,+175]mm · 7 of 25 slices shown]
[im 1/25]
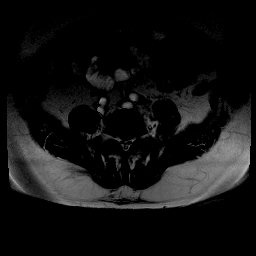
[im 5/25]
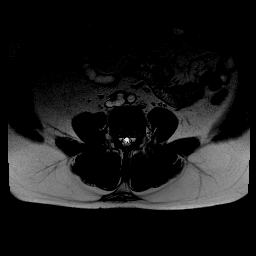
[im 9/25]
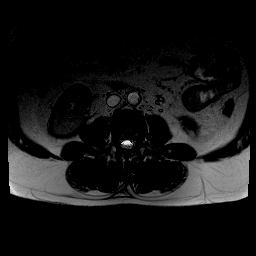
[im 13/25]
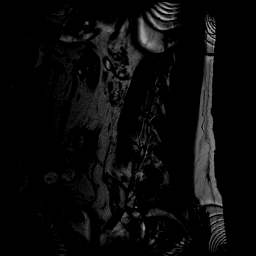
[im 17/25]
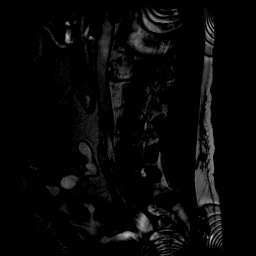
[im 21/25]
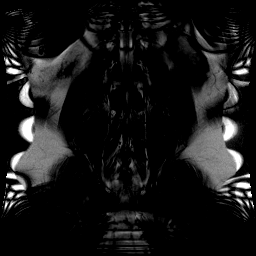
[im 25/25]
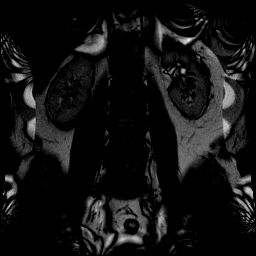

[Series 3: t2_cor · coronal · 4.0mm · 0.53mm/px · 5 of 16 slices shown]
[im 1/16]
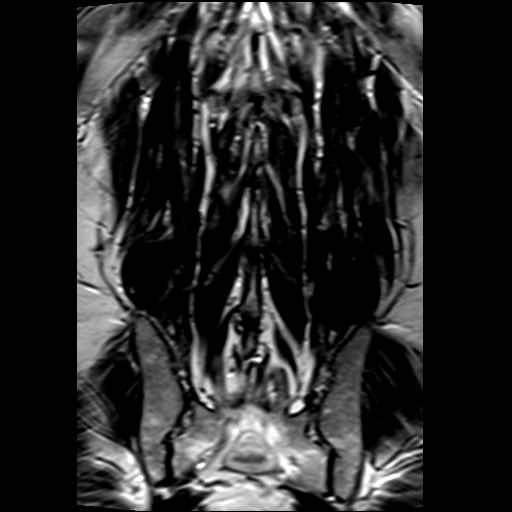
[im 4/16]
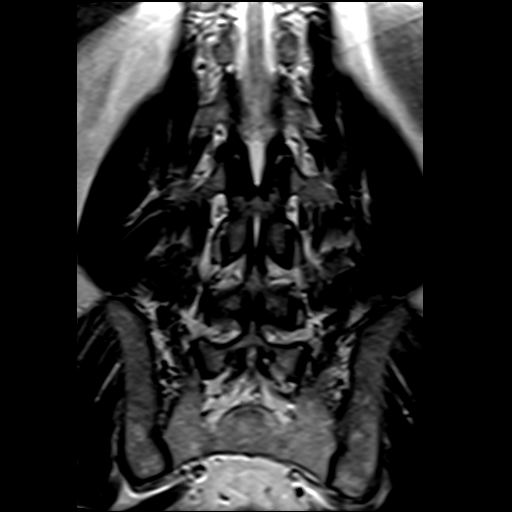
[im 8/16]
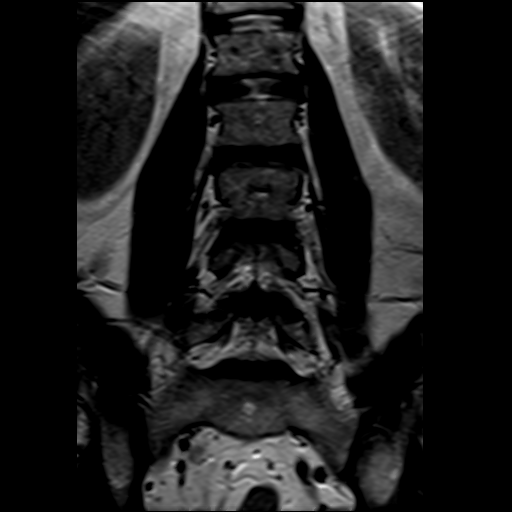
[im 12/16]
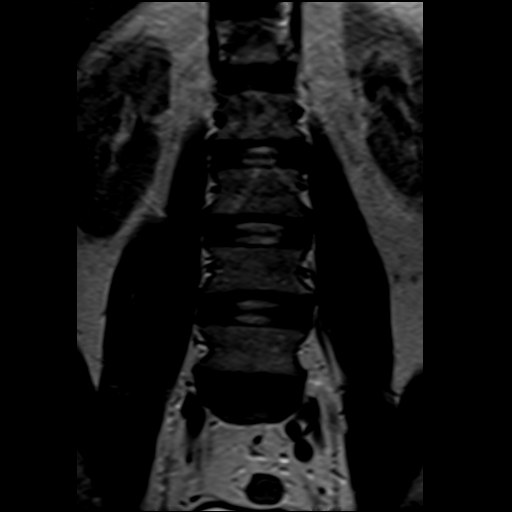
[im 16/16]
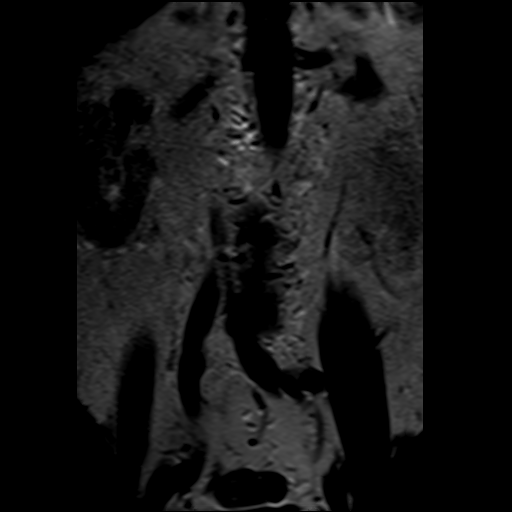

[Series 4: t2_sag · sagittal · 4.0mm · 0.41mm/px · 5 of 15 slices shown]
[im 1/15]
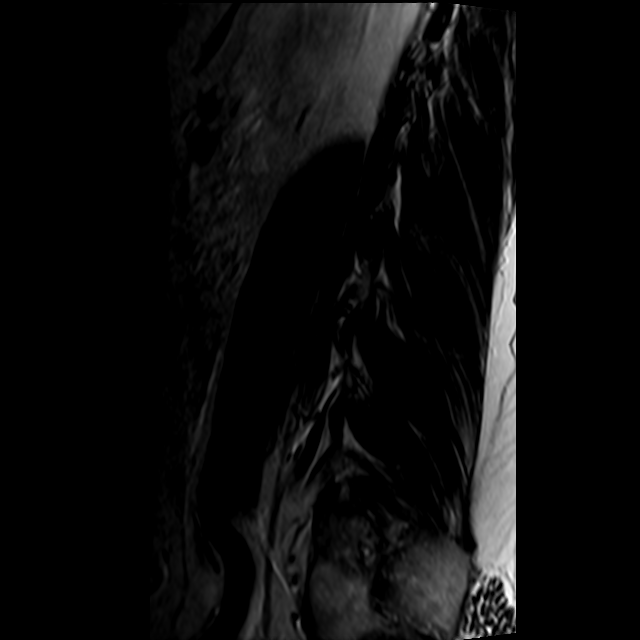
[im 4/15]
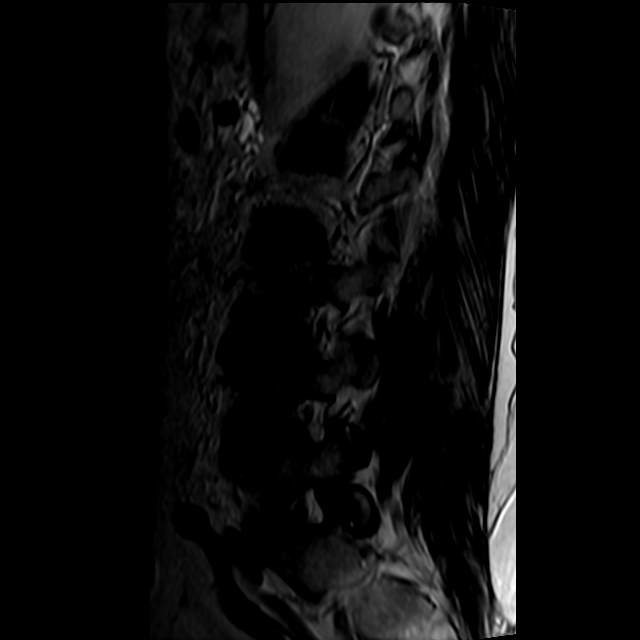
[im 8/15]
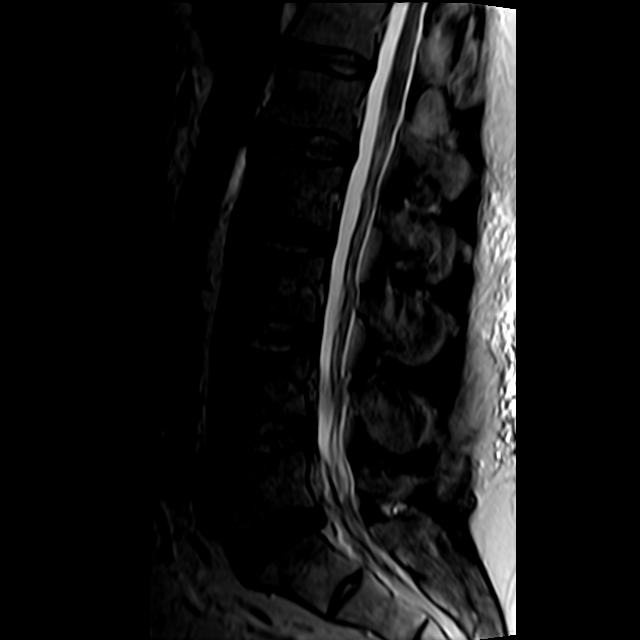
[im 11/15]
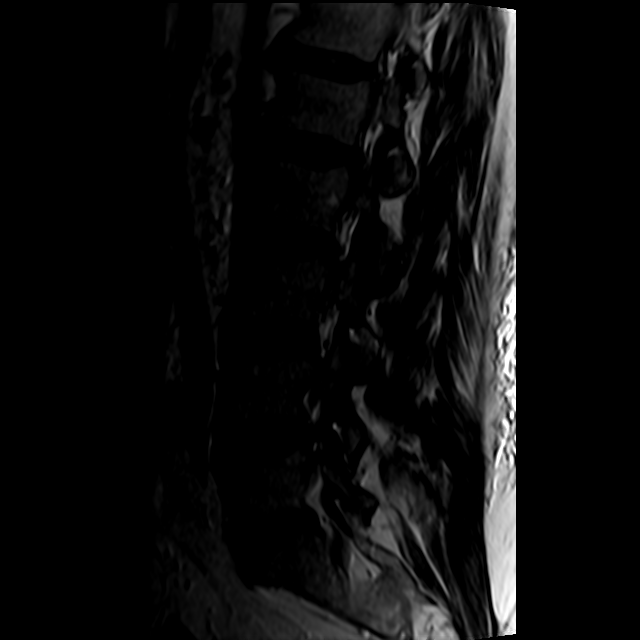
[im 15/15]
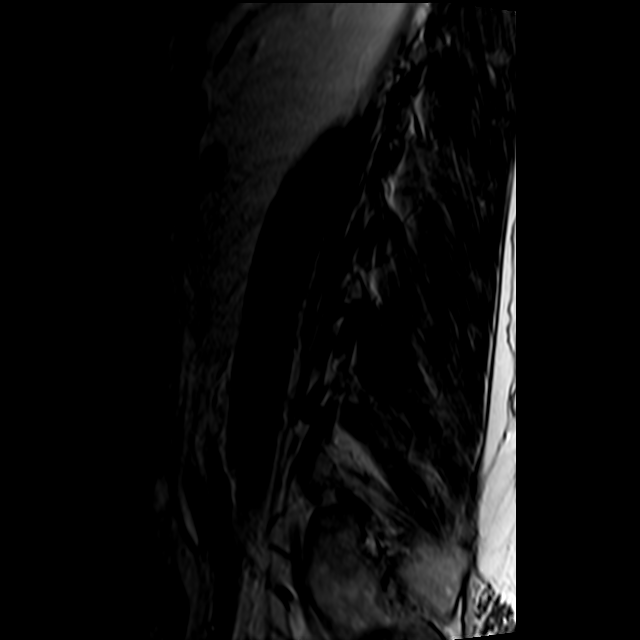

[Series 5: t1_sag · sagittal · 4.0mm · 0.81mm/px · 5 of 15 slices shown]
[im 1/15]
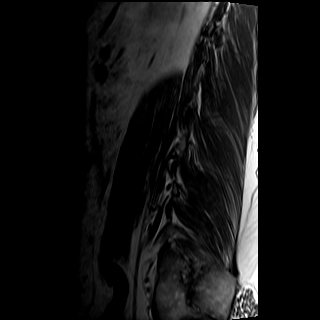
[im 4/15]
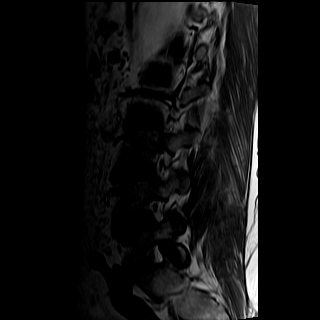
[im 8/15]
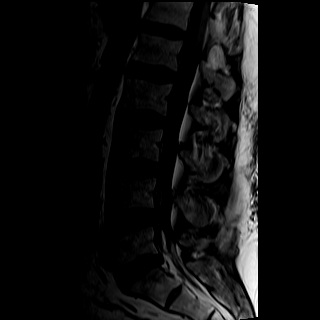
[im 11/15]
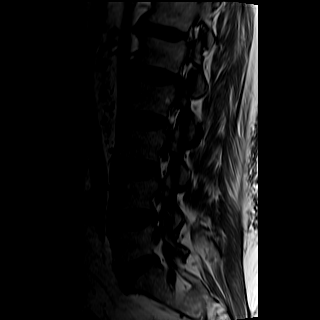
[im 15/15]
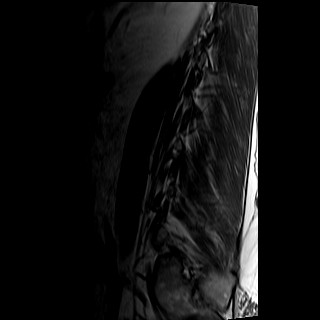

[Series 6: ir_sag · sagittal · 4.0mm · 0.51mm/px · 2 of 15 slices shown]
[im 1/15]
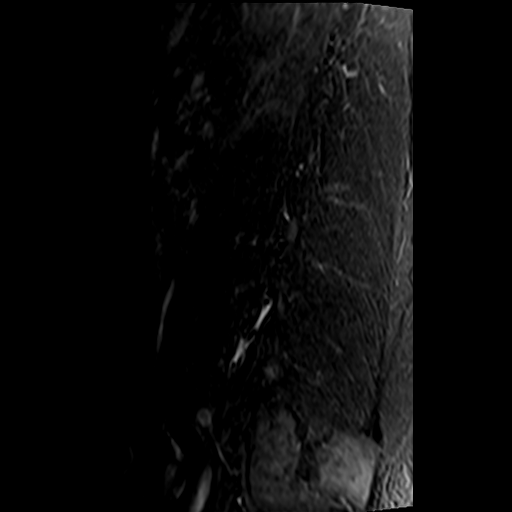
[im 4/15]
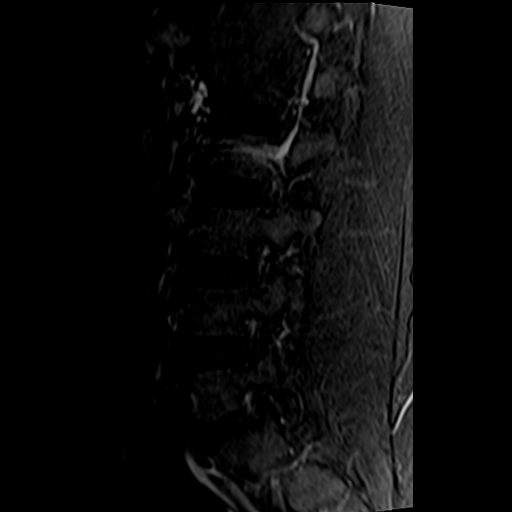

[24 of 48 positions shown; findings below may reference images not displayed]

FINDINGS: Localizer images suggest minimal levoscoliosis. No anterolisthesis or retrolisthesis. No fracture or destructive lesion. There is multilevel degenerative disc desiccation most advanced at L5-S1. Mild diffuse disc bulging. No abnormal signal in an intradural or paraspinous position. Mild to moderate epidural lipomatosis. Conus medullaris terminates normally at L1-2.

L1-2: Minimal disc bulge. Mild facet arthropathy. No significant canal or foraminal stenosis.

L2-3: Minimal disc bulge. Mild to moderate facet arthropathy. Mild epidural lipomatosis. Borderline canal stenosis. No foraminal stenoses.

L3-4: Minimal disc bulge. Mild to moderate facet arthropathy. Mild epidural lipomatosis. Borderline canal stenosis. No foraminal stenosis.

L4-5: Mild disc bulge. Mild to moderate facet arthropathy. Mild epidural lipomatosis. Mild thecal sac compression. Mild foraminal stenoses.

L5-S1: Mild diffuse disc bulge. No herniation. Mild facet arthropathy. Epidural lipomatosis. No canal or foraminal stenosis.
IMPRESSION: Mild multilevel degenerative disc disease and facet arthropathy superimposed upon mild to moderate epidural lipomatosis producing mild thecal sac compression at L4-5. Mild foraminal stenoses at L4-5. No focal herniation. No fracture or destructive lesion. Localizer images suggest minimal levoscoliosis.

## 2021-12-04 IMAGING — CT CT ABD/PEL W CONT (ENTEROGRAPHY PROTOCOL)
2 of 7 series · 14 of 46 positions shown, 16 images · non-contrast
Comparison: none

[Series 6: coronal · coronal · 0.88mm/px · 3 of 121 slices shown]
[im 31/121  soft-tissue]
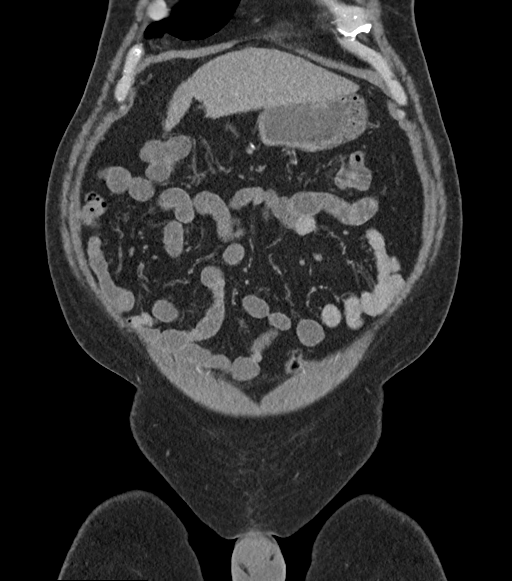
[im 61/121  soft-tissue]
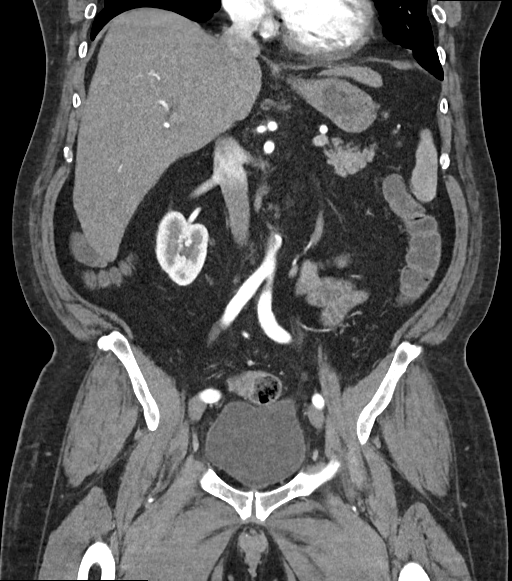
[im 91/121  soft-tissue]
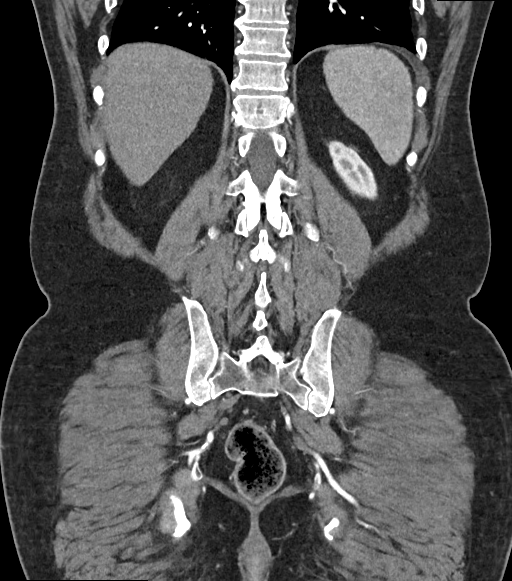

[Series 10: soft tissue · axial · 0.71mm/px · z∈[+1224,+1656]mm · 11 of 170 slices shown, 13 images]
[im 13/170  soft-tissue]
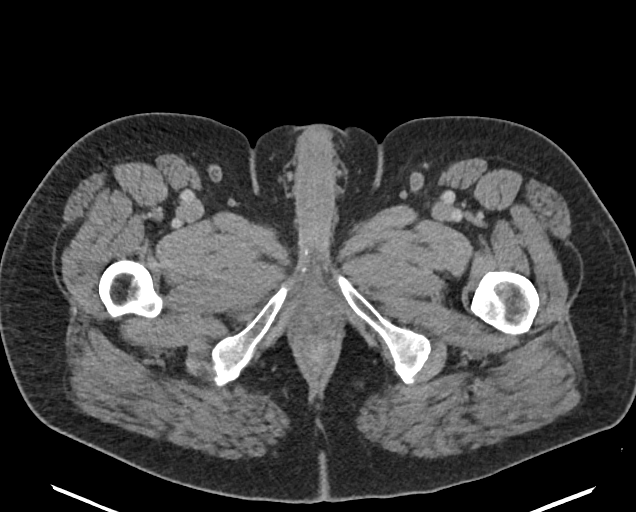
[im 13/170  bone]
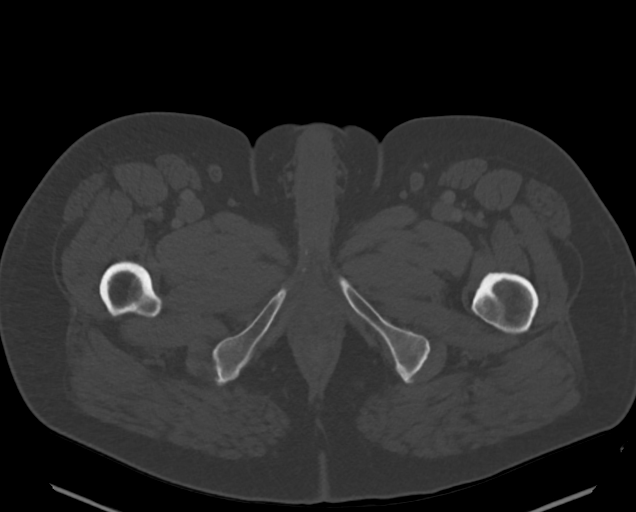
[im 25/170  soft-tissue]
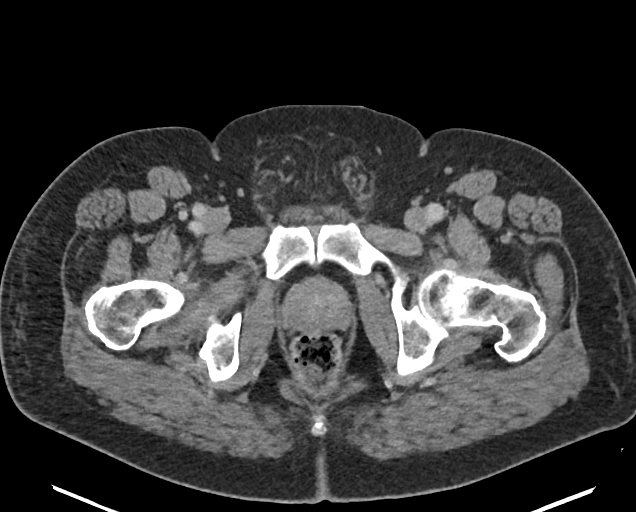
[im 37/170  soft-tissue]
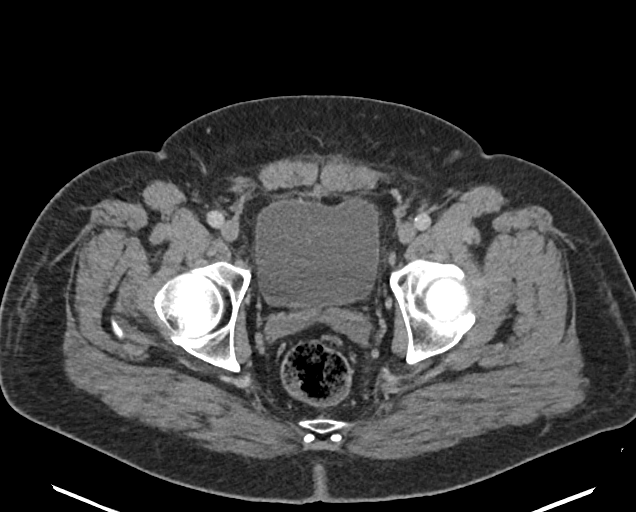
[im 61/170  soft-tissue]
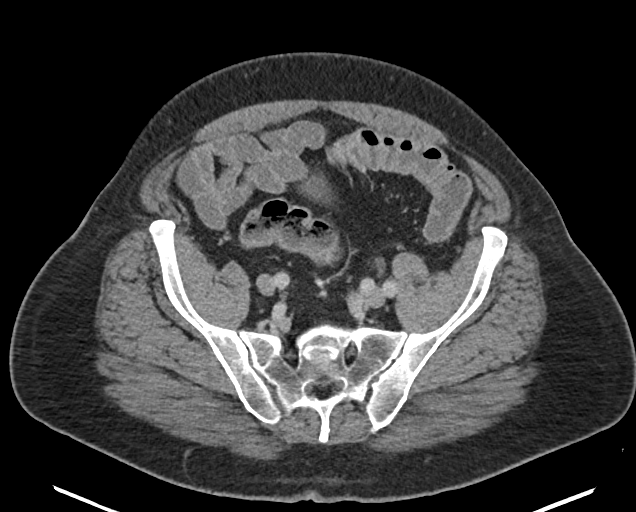
[im 73/170  soft-tissue]
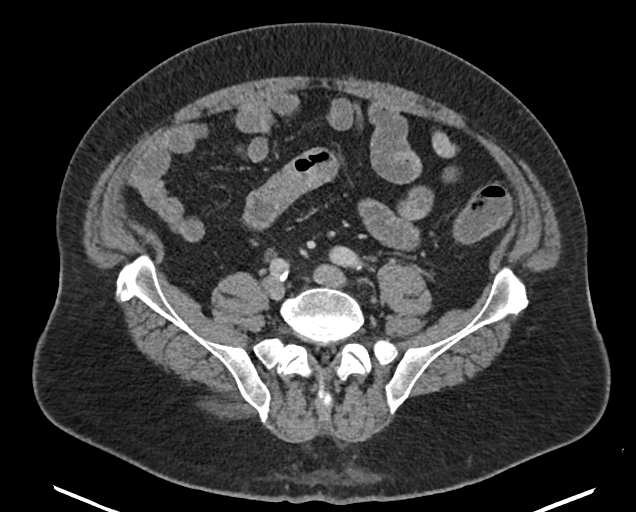
[im 85/170  soft-tissue]
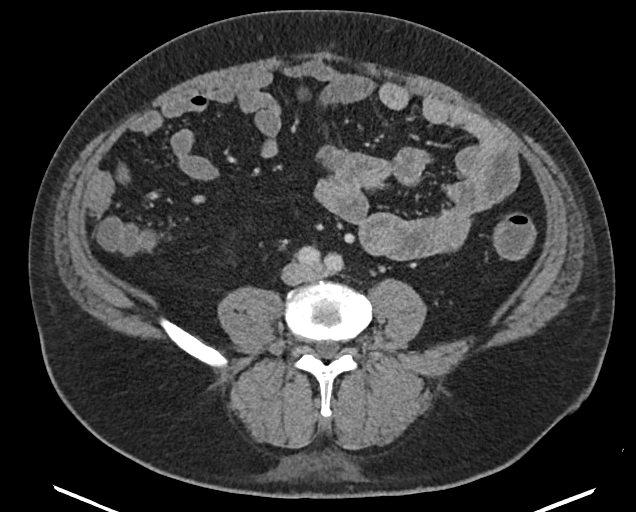
[im 97/170  soft-tissue]
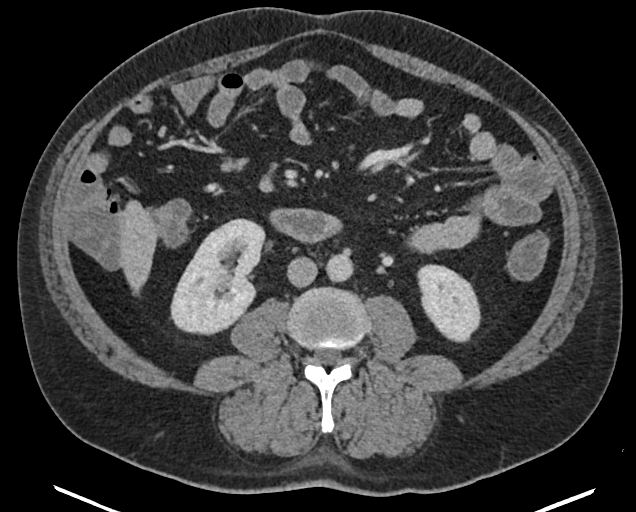
[im 109/170  soft-tissue]
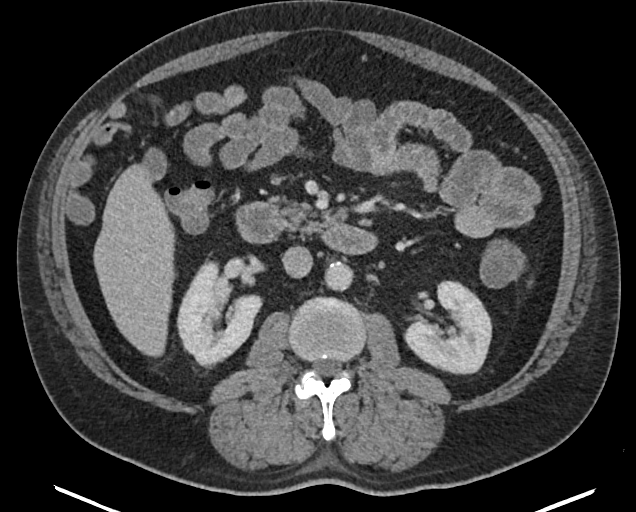
[im 133/170  soft-tissue]
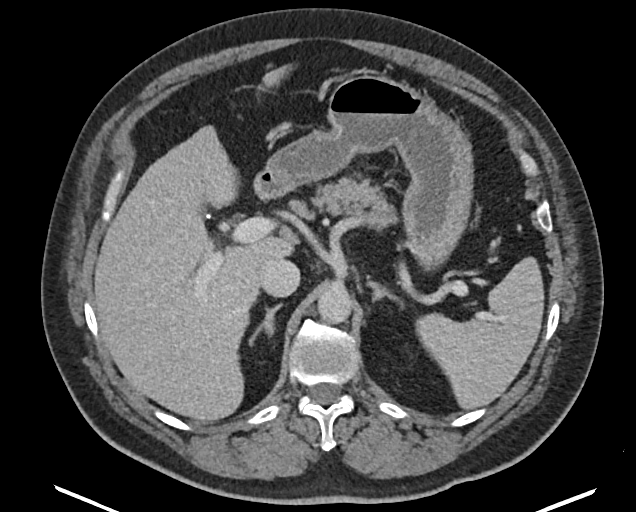
[im 133/170  bone]
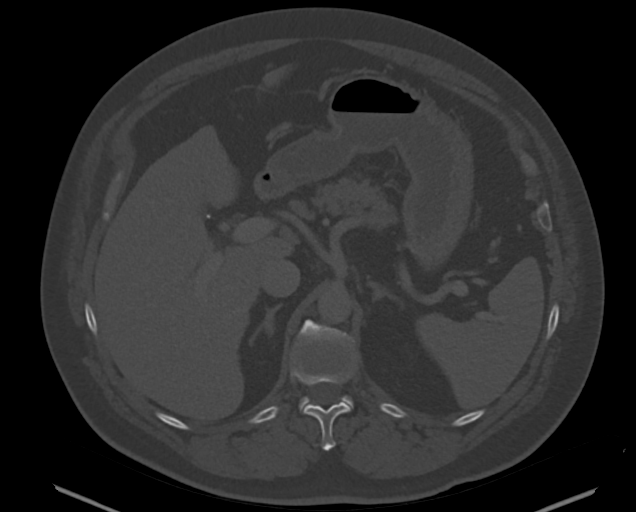
[im 145/170  soft-tissue]
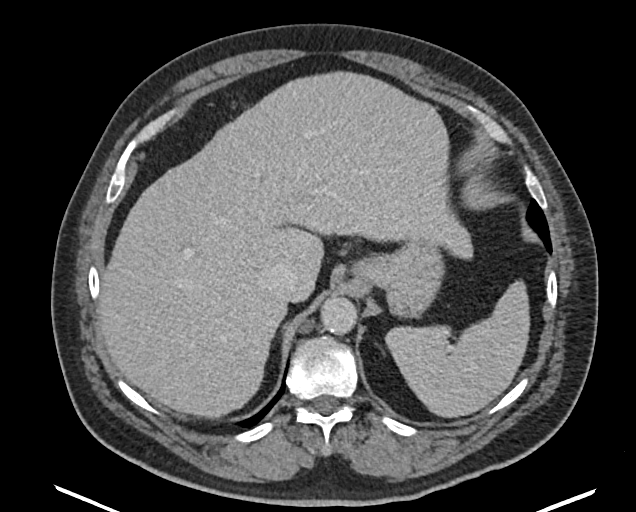
[im 157/170  soft-tissue]
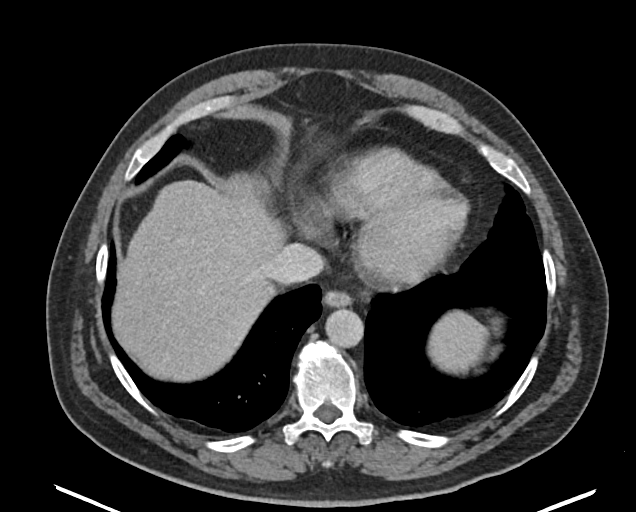

[14 of 46 positions shown; findings below may reference images not displayed]

REASON FOR EXAM

*
Diverticulitis

*
Bloating

*
Diarrhea

*
Irritable bowel syndrome with diarrhea

*
Right lower quadrant abdominal pain

COMPARISON

*
None

TECHNIQUE

*
CT images of the abdomen and pelvis were acquired after the administration of 100 cc Rsovue-77Q IV contrast

*
1,350 cc Volumen oral contrast

*
i-STAT creatinine 1.0 mg/dL

*
Total radiation dose to patient is CTDIvol 37.79 mGy and DLP 1766.70 mGy-cm.

*
Dose reduction technique used: Automated exposure control and adjustment of the mA and/or kV according to patient size

*
CT count in previous 12 months: 1

FINDINGS

Small Bowel

*
Distention is suboptimal, but sufficient for interpretation

*
No concerning segmental thickening

*
No abnormal enhancement

*
No stricture or mass

Large Bowel

*
No abnormal thickening or enhancement

*
Sigmoid colon contains a few non-inflamed diverticula

*
Appendectomy

Stomach

*
No thickening

Peritoneum

*
No abnormal mesenteric lymph nodes

*
No abscess

*
No ascites

Additional Findings

*
Mild hepatic steatosis

*
Subtle nodular contour to the liver

*
Cholecystectomy

*
Mild atherosclerotic disease

*
Minimal fatty pleural thickening in the bases

IMPRESSION

*
No CT signs of inflammation in the small or large bowel

*
Liver morphology is suggestive of cirrhosis

## 2022-05-02 IMAGING — RF ESOPHAGRAM W/AIR
11 series · 14 of 24 positions shown · non-contrast
Comparison: None available.

Images Obtained from [facility]
INDICATION: Bloating, nausea, difficulty swallowing
TECHNIQUE: Multiple fluoroscopic images were obtained of the esophagus after the oral administration of thick and thin barium. Effervescent crystals were utilized.
RADIATION DOSE:
Fluoroscopic images: 291 (including multiple cine clips)
Fluoroscopy time: 2.0 minutes
Dose area product: 244.71 microGy*m2
Reference air kerma: 15.8 mGy

[Series 1: fluoro_barium_bariatric 2fps_(person_nam · 0.17mm/px · 1 of 5 frames shown (1 of 4)]
[frame 1/5]
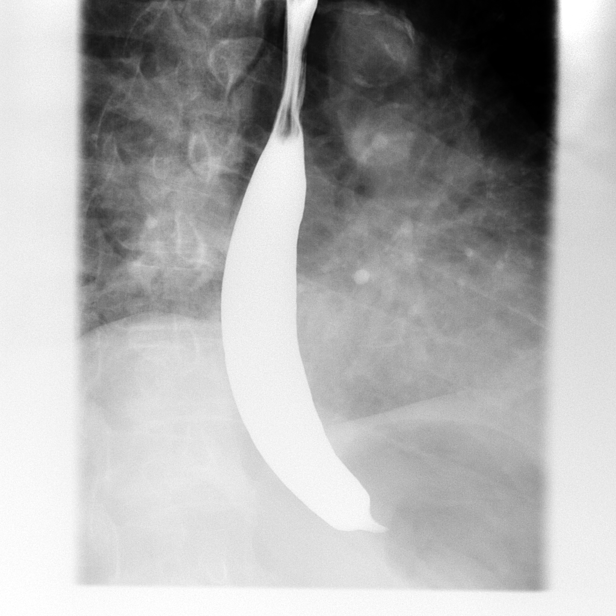

[Series 2: fluoro_barium_bariatric 2fps_(person_nam · 0.17mm/px · 1 of 5 frames shown (2 of 4)]
[frame 1/5]
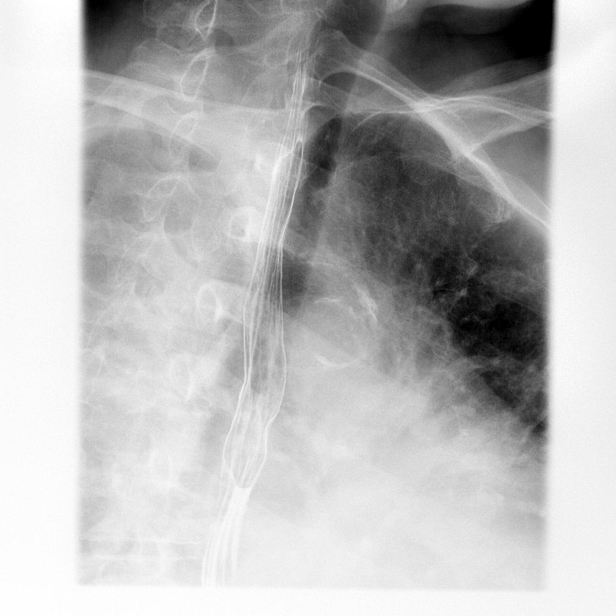

[Series 3: fluoro_barium_bariatric 2fps_(person_nam · 0.17mm/px · 1 of 4 frames shown (3 of 4)]
[frame 1/4]
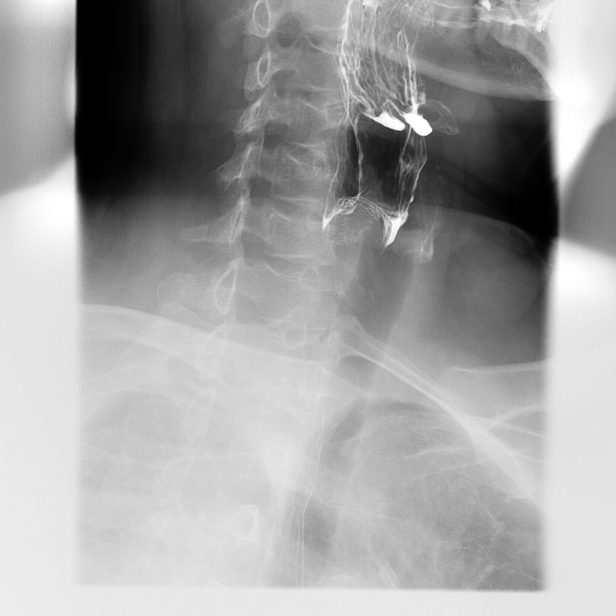

[Series 4: fluoro_barium_bariatric 2fps_(person_nam · 0.17mm/px · 1 of 1 slices shown (4 of 4)]
[im 1/1]
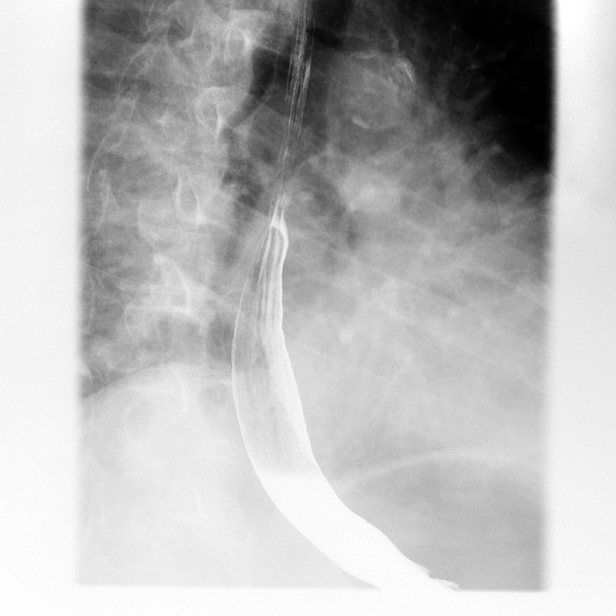

[Series 5: cp_bariatric · 0.17mm/px · 1 of 29 frames shown (1 of 7)]
[frame 7/29]
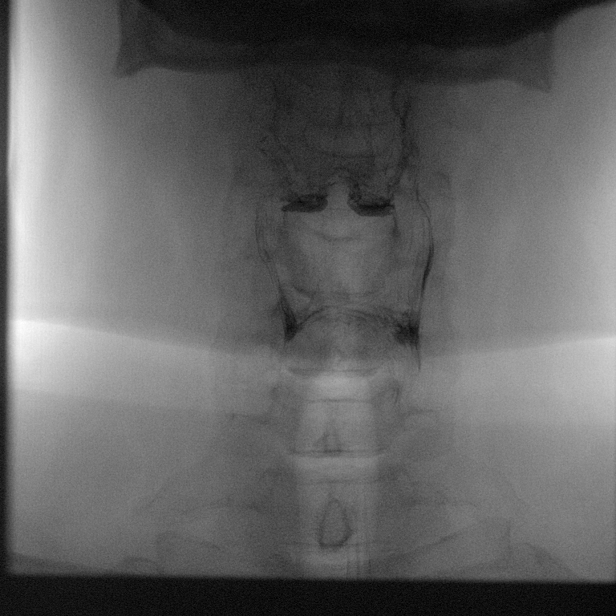

[Series 6: cp_bariatric · 0.17mm/px · 1 of 29 frames shown (2 of 7)]
[frame 2/29]
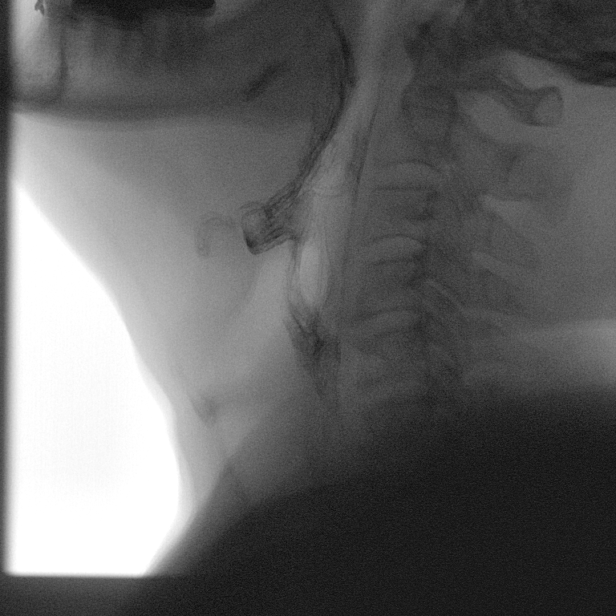

[Series 7: cp_bariatric · 0.17mm/px · 2 of 81 frames shown (3 of 7)]
[frame 13/81]
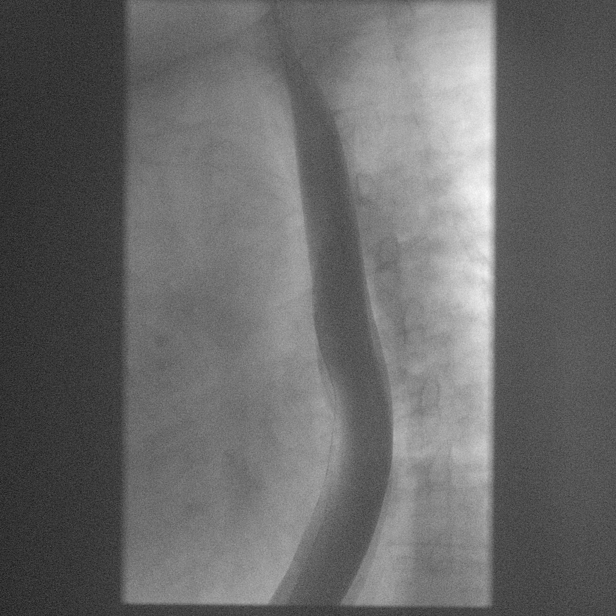
[frame 41/81]
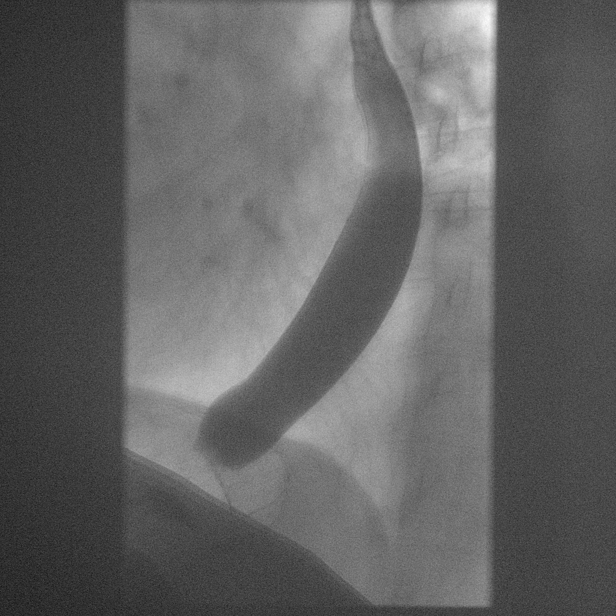

[Series 9: cp_bariatric · 0.17mm/px · 2 of 38 frames shown (4 of 7)]
[frame 6/38]
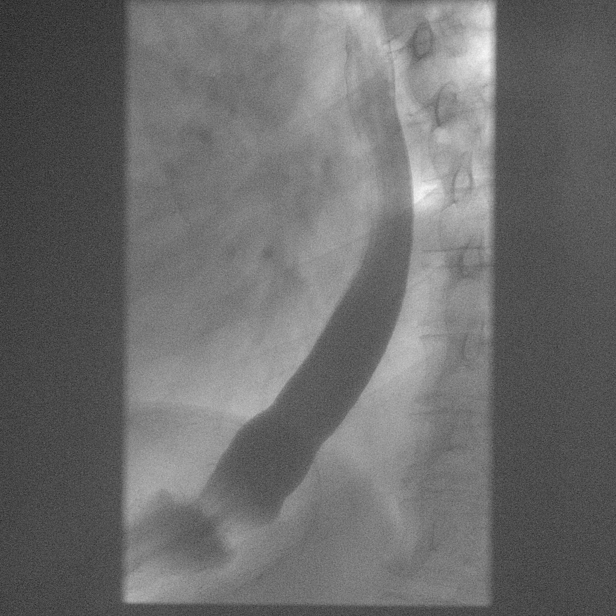
[frame 33/38]
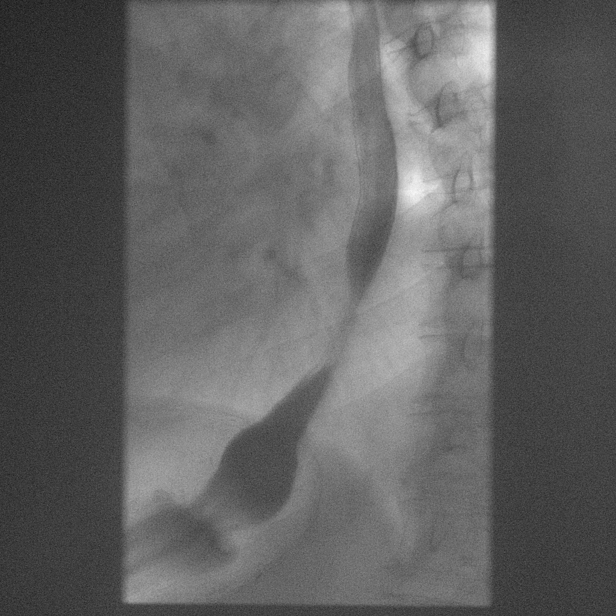

[Series 10: cp_bariatric · 0.17mm/px · 1 of 45 frames shown (5 of 7)]
[frame 39/45]
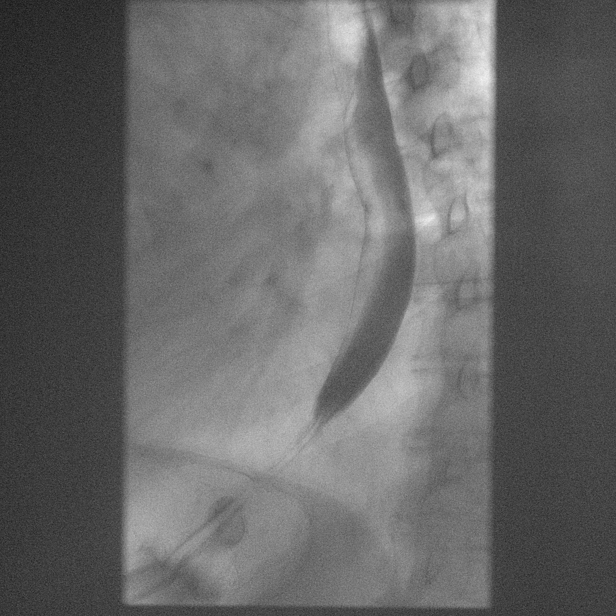

[Series 11: cp_bariatric · 0.17mm/px · 1 of 35 frames shown (6 of 7)]
[frame 6/35]
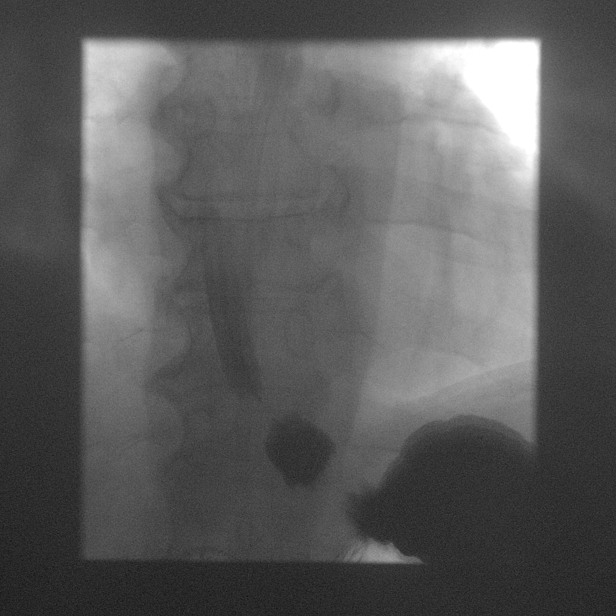

[Series 12: cp_bariatric · 0.17mm/px · 2 of 8 frames shown (7 of 7)]
[frame 1/8]
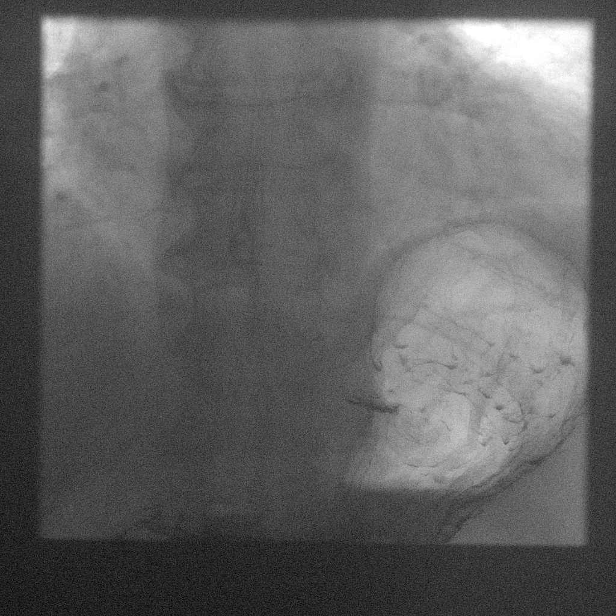
[frame 7/8]
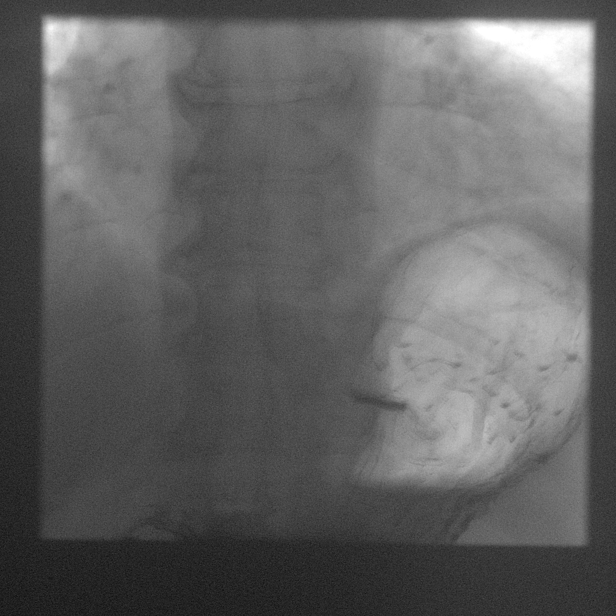

[14 of 24 positions shown; findings below may reference images not displayed]

FINDINGS: The esophagus is unremarkable in appearance without focal stricture or mass. Esophageal motility is within normal limits.
The gastroesophageal junction is patent. Tiny hiatal hernia. Mild reflux is noted into the distal esophagus.
A 13 mm barium tablet was swallowed without difficulty.
IMPRESSION: Tiny hiatal hernia, with mild gastroesophageal reflux.

## 2023-04-15 IMAGING — MR MRI BRAIN WO CONTRAST
8 series · 48 of 48 positions shown · non-contrast
Comparison: None.

Images Obtained from [facility]
INDICATION: 57 years-old Male with syncope and collapse.
TECHNIQUE: Multiplanar multisequence MRI of the brain was performed without intravenous contrast.

[Series 5: flair_axial_fs · axial · 4.0mm · 0.47mm/px · z∈[-46,+115]mm · 3 of 32 slices shown]
[im 1/32]
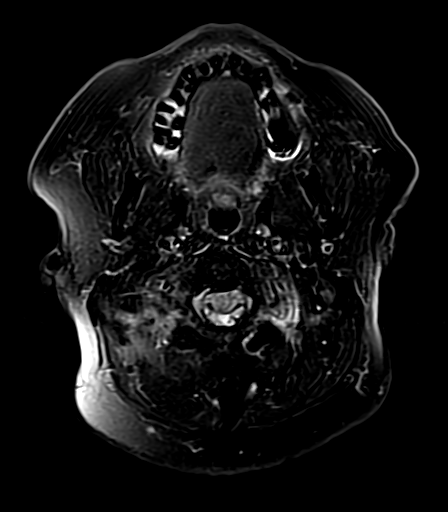
[im 16/32]
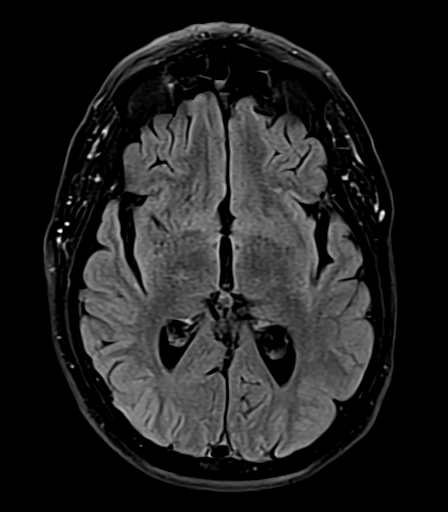
[im 32/32]
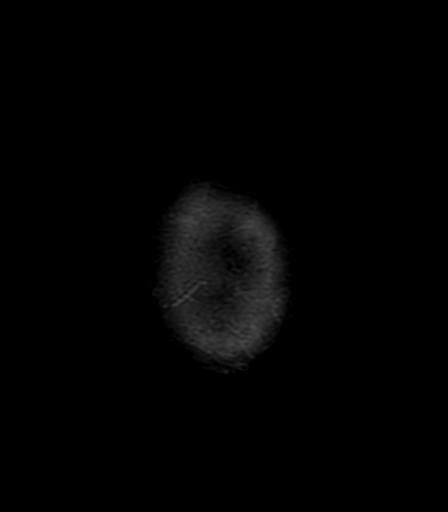

[Series 6: t2_axial · axial · 4.0mm · 0.47mm/px · z∈[-47,+114]mm · 2 of 32 slices shown]
[im 1/32]
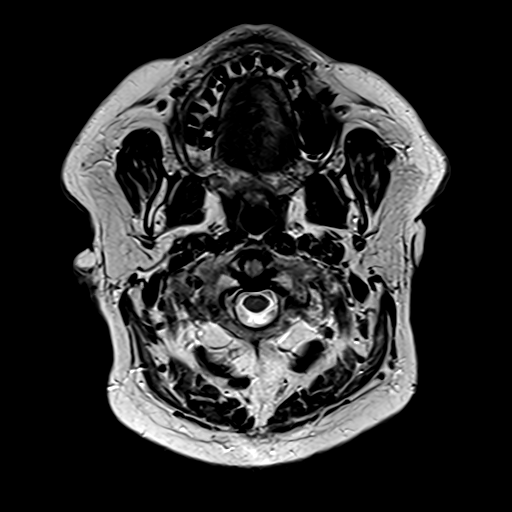
[im 32/32]
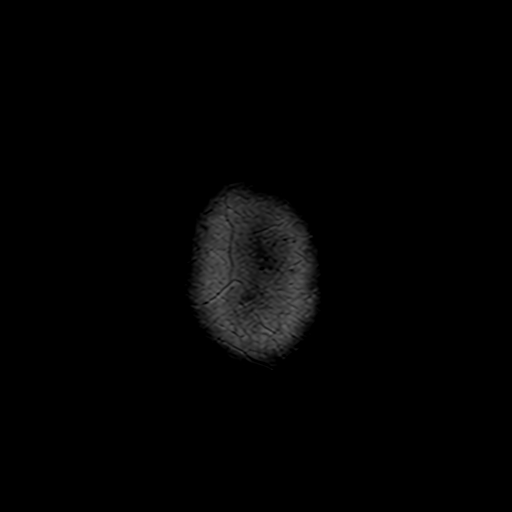

[Series 7: DWI · axial · 4.0mm · 1.36mm/px · z∈[-47,+114]mm · 2 of 32 slices shown (1 of 2)]
[im 1/32]
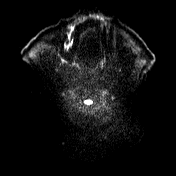
[im 32/32]
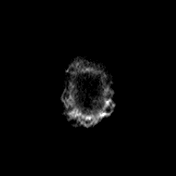

[Series 8: DWI · axial · 4.0mm · 1.36mm/px · z∈[-47,+114]mm · 2 of 32 slices shown (2 of 2)]
[im 1/32]
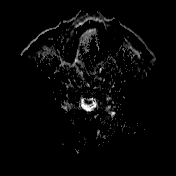
[im 32/32]
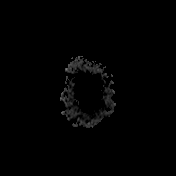

[Series 9: T1 · axial · non-contrast · 1.0mm · 0.94mm/px · z∈[-45,+129]mm · 13 of 176 slices shown (1 of 3)]
[im 1/176]
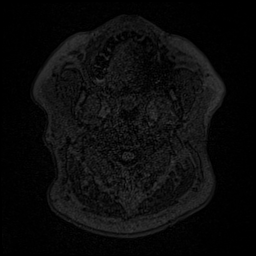
[im 15/176]
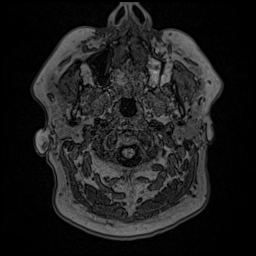
[im 30/176]
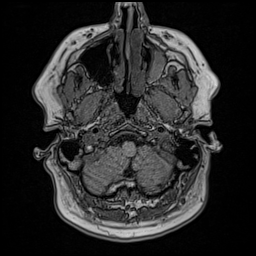
[im 44/176]
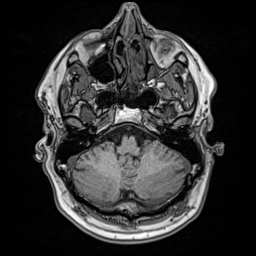
[im 59/176]
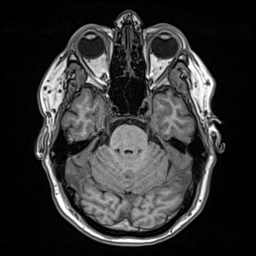
[im 73/176]
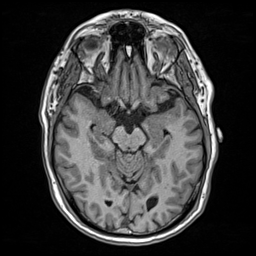
[im 88/176]
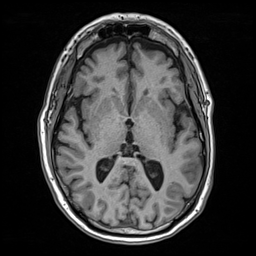
[im 103/176]
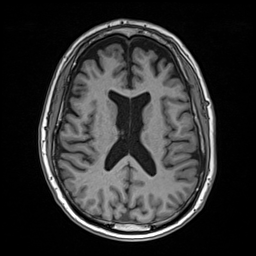
[im 117/176]
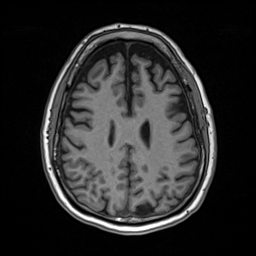
[im 132/176]
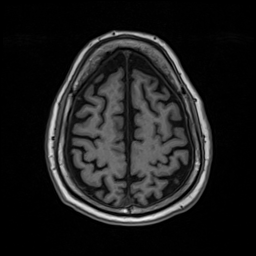
[im 146/176]
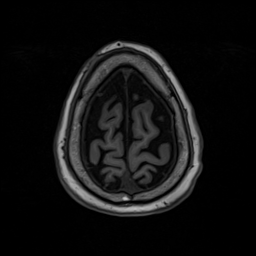
[im 161/176]
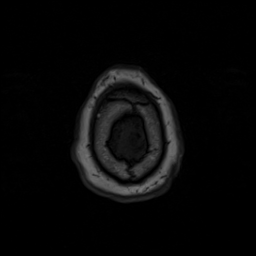
[im 176/176]
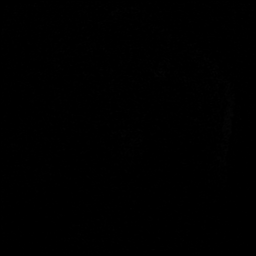

[Series 10: T1 · sagittal · 1.0mm · 0.94mm/px · 12 of 160 slices shown (2 of 3)]
[im 1/160]
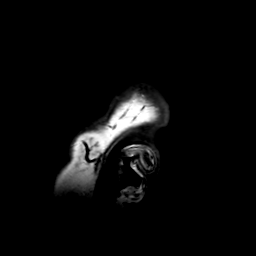
[im 15/160]
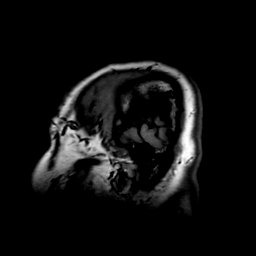
[im 29/160]
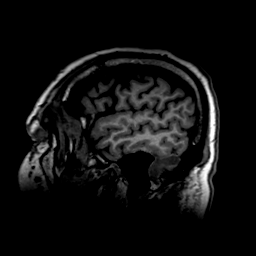
[im 44/160]
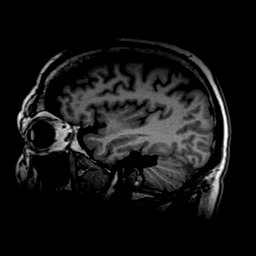
[im 58/160]
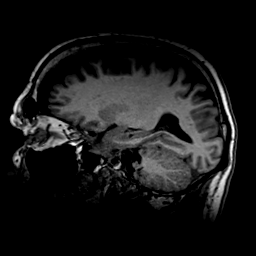
[im 73/160]
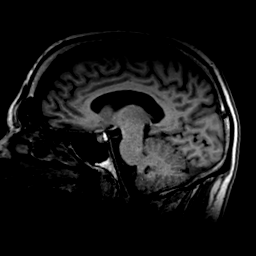
[im 87/160]
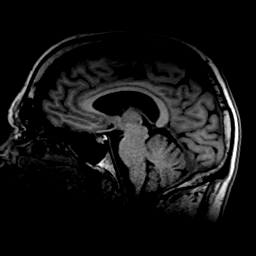
[im 102/160]
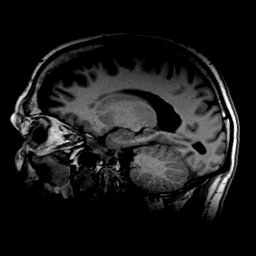
[im 116/160]
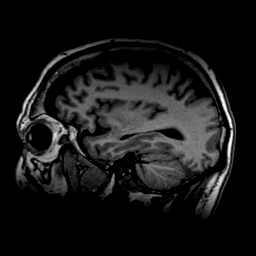
[im 131/160]
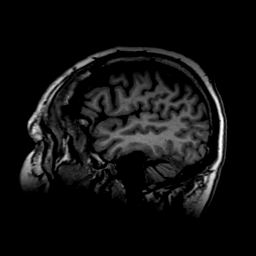
[im 145/160]
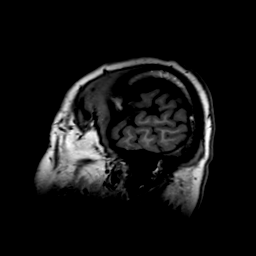
[im 160/160]
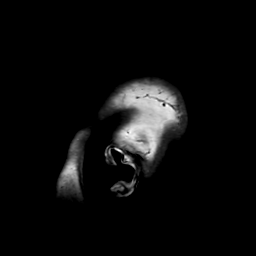

[Series 11: T1 · coronal · 1.0mm · 0.94mm/px · 12 of 170 slices shown (3 of 3)]
[im 1/170]
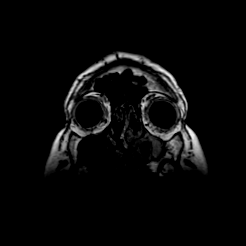
[im 16/170]
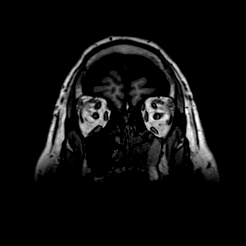
[im 31/170]
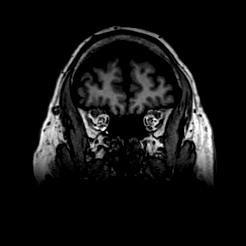
[im 47/170]
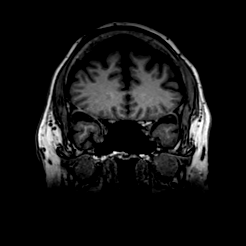
[im 62/170]
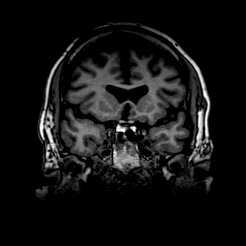
[im 77/170]
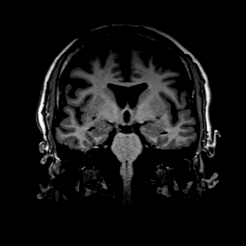
[im 93/170]
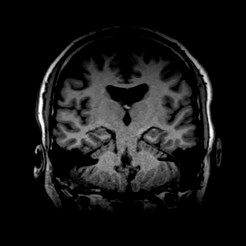
[im 108/170]
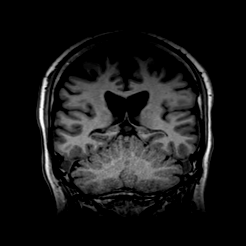
[im 123/170]
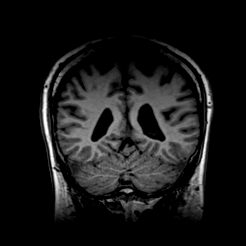
[im 139/170]
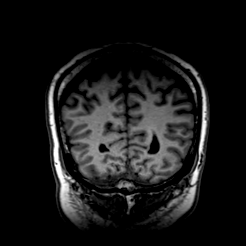
[im 154/170]
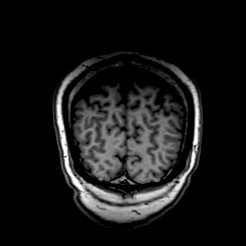
[im 170/170]
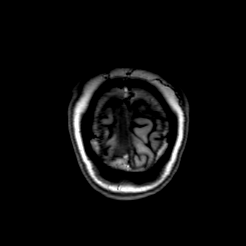

[Series 12: flash_axial · axial · 4.0mm · 0.47mm/px · z∈[-47,+114]mm · 2 of 32 slices shown]
[im 1/32]
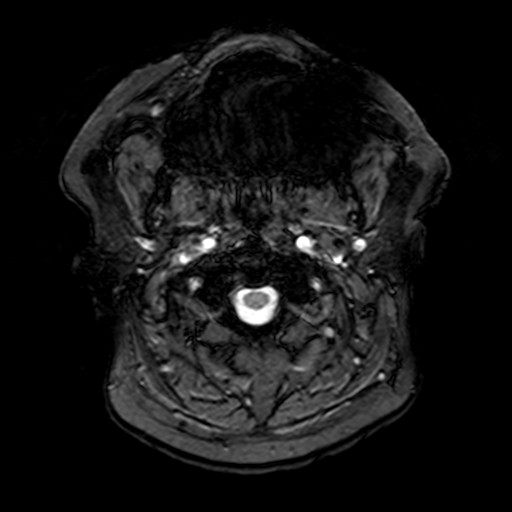
[im 32/32]
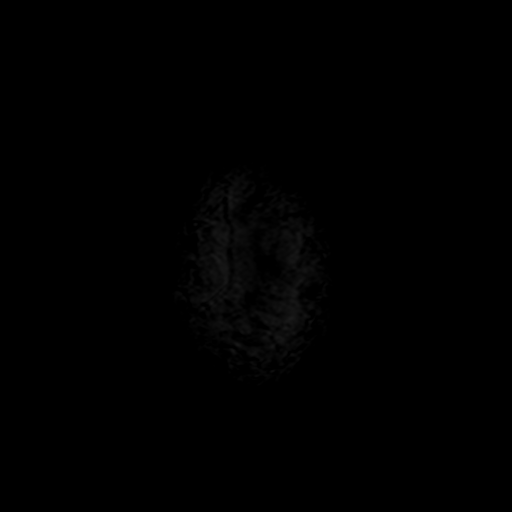

[48 of 48 positions shown; findings below may reference images not displayed]

FINDINGS: Brain parenchyma: No acute infarct or hemorrhage. Minimal foci of T2/FLAIR hyperintense signal in the bilateral cerebral white matter, nonspecific, commonly seen with chronic microangiopathic change.
Preserved major vascular flow voids.
Ventricles: No midline shift, herniation or hydrocephalus.
Extra-axial spaces: No extra-axial fluid collection.
Extracranial structures: Hypoplastic left maxillary sinus with severe mucosal thickening/complete opacification. Marrow signal normal. Orbits unremarkable. Soft tissues normal.
IMPRESSION: 1.  No acute intracranial abnormality.
2.  Hypoplastic left maxillary sinus with severe mucosal thickening/complete opacification.
